# Patient Record
Sex: Female | Born: 1940 | Race: White | Hispanic: No | Marital: Married | Smoking: Never smoker
Health system: Southern US, Community
[De-identification: ages and names within clinical notes are randomized; demographics above are authoritative.]

## PROBLEM LIST (undated history)

## (undated) DIAGNOSIS — G43909 Migraine, unspecified, not intractable, without status migrainosus: Secondary | ICD-10-CM

## (undated) DIAGNOSIS — M8580 Other specified disorders of bone density and structure, unspecified site: Principal | ICD-10-CM

## (undated) DIAGNOSIS — H269 Unspecified cataract: Secondary | ICD-10-CM

## (undated) DIAGNOSIS — M1990 Unspecified osteoarthritis, unspecified site: Secondary | ICD-10-CM

## (undated) DIAGNOSIS — K219 Gastro-esophageal reflux disease without esophagitis: Secondary | ICD-10-CM

## (undated) DIAGNOSIS — R011 Cardiac murmur, unspecified: Secondary | ICD-10-CM

## (undated) DIAGNOSIS — M949 Disorder of cartilage, unspecified: Secondary | ICD-10-CM

## (undated) DIAGNOSIS — M899 Disorder of bone, unspecified: Secondary | ICD-10-CM

## (undated) DIAGNOSIS — M810 Age-related osteoporosis without current pathological fracture: Secondary | ICD-10-CM

## (undated) DIAGNOSIS — F329 Major depressive disorder, single episode, unspecified: Secondary | ICD-10-CM

## (undated) DIAGNOSIS — IMO0001 Reserved for inherently not codable concepts without codable children: Secondary | ICD-10-CM

## (undated) DIAGNOSIS — F419 Anxiety disorder, unspecified: Secondary | ICD-10-CM

## (undated) DIAGNOSIS — E785 Hyperlipidemia, unspecified: Secondary | ICD-10-CM

## (undated) DIAGNOSIS — IMO0002 Reserved for concepts with insufficient information to code with codable children: Secondary | ICD-10-CM

## (undated) DIAGNOSIS — I219 Acute myocardial infarction, unspecified: Secondary | ICD-10-CM

## (undated) DIAGNOSIS — E039 Hypothyroidism, unspecified: Secondary | ICD-10-CM

## (undated) DIAGNOSIS — M797 Fibromyalgia: Secondary | ICD-10-CM

## (undated) DIAGNOSIS — D649 Anemia, unspecified: Secondary | ICD-10-CM

## (undated) DIAGNOSIS — D869 Sarcoidosis, unspecified: Secondary | ICD-10-CM

## (undated) DIAGNOSIS — D510 Vitamin B12 deficiency anemia due to intrinsic factor deficiency: Secondary | ICD-10-CM

## (undated) HISTORY — DX: Gastro-esophageal reflux disease without esophagitis: K21.9

## (undated) HISTORY — DX: Vitamin B12 deficiency anemia due to intrinsic factor deficiency: D51.0

## (undated) HISTORY — PX: COLONOSCOPY: SHX174

## (undated) HISTORY — DX: Migraine, unspecified, not intractable, without status migrainosus: G43.909

## (undated) HISTORY — DX: Reserved for inherently not codable concepts without codable children: IMO0001

## (undated) HISTORY — DX: Hypothyroidism, unspecified: E03.9

## (undated) HISTORY — DX: Major depressive disorder, single episode, unspecified: F32.9

## (undated) HISTORY — DX: Age-related osteoporosis without current pathological fracture: M81.0

## (undated) HISTORY — PX: FRACTURE SURGERY: SHX138

## (undated) HISTORY — DX: Unspecified osteoarthritis, unspecified site: M19.90

## (undated) HISTORY — DX: Acute myocardial infarction, unspecified: I21.9

## (undated) HISTORY — DX: Hyperlipidemia, unspecified: E78.5

## (undated) HISTORY — DX: Unspecified cataract: H26.9

## (undated) HISTORY — DX: Disorder of cartilage, unspecified: M94.9

## (undated) HISTORY — DX: Anemia, unspecified: D64.9

## (undated) HISTORY — PX: UPPER GASTROINTESTINAL ENDOSCOPY: SHX188

## (undated) HISTORY — DX: Cardiac murmur, unspecified: R01.1

## (undated) HISTORY — PX: CATARACT EXTRACTION: SUR2

## (undated) HISTORY — DX: Disorder of bone, unspecified: M89.9

## (undated) HISTORY — PX: EYE SURGERY: SHX253

## (undated) HISTORY — DX: Reserved for concepts with insufficient information to code with codable children: IMO0002

## (undated) HISTORY — DX: Sarcoidosis, unspecified: D86.9

---

## 1966-01-14 HISTORY — PX: LYMPH NODE BIOPSY: SHX201

## 2005-02-08 ENCOUNTER — Encounter: Payer: Self-pay | Admitting: Internal Medicine

## 2006-10-09 ENCOUNTER — Ambulatory Visit: Payer: Self-pay | Admitting: Internal Medicine

## 2006-10-09 LAB — CONVERTED CEMR LAB
Alkaline Phosphatase: 54 units/L (ref 39–117)
BUN: 16 mg/dL (ref 6–23)
Basophils Relative: 1 % (ref 0.0–1.0)
Bilirubin Urine: NEGATIVE
CO2: 29 meq/L (ref 19–32)
Cholesterol: 227 mg/dL (ref 0–200)
Creatinine, Ser: 0.7 mg/dL (ref 0.4–1.2)
Crystals: NEGATIVE
Eosinophils Relative: 2.6 % (ref 0.0–5.0)
Hemoglobin, Urine: NEGATIVE
Hemoglobin: 13.1 g/dL (ref 12.0–15.0)
Lymphocytes Relative: 19.8 % (ref 12.0–46.0)
Monocytes Absolute: 0.7 10*3/uL (ref 0.2–0.7)
Mucus, UA: NEGATIVE
Neutro Abs: 4.6 10*3/uL (ref 1.4–7.7)
Nitrite: NEGATIVE
Potassium: 4.6 meq/L (ref 3.5–5.1)
Sodium: 140 meq/L (ref 135–145)
TSH: 1.4 microintl units/mL (ref 0.35–5.50)
Total Bilirubin: 1.1 mg/dL (ref 0.3–1.2)
Total Protein, Urine: NEGATIVE mg/dL
Total Protein: 6.7 g/dL (ref 6.0–8.3)
Urine Glucose: NEGATIVE mg/dL
Urobilinogen, UA: 0.2 (ref 0.0–1.0)
VLDL: 43 mg/dL — ABNORMAL HIGH (ref 0–40)
WBC: 7 10*3/uL (ref 4.5–10.5)

## 2006-10-13 ENCOUNTER — Ambulatory Visit: Payer: Self-pay | Admitting: Internal Medicine

## 2006-10-17 ENCOUNTER — Encounter: Payer: Self-pay | Admitting: Internal Medicine

## 2006-10-17 DIAGNOSIS — K219 Gastro-esophageal reflux disease without esophagitis: Secondary | ICD-10-CM

## 2006-10-17 DIAGNOSIS — M899 Disorder of bone, unspecified: Secondary | ICD-10-CM | POA: Insufficient documentation

## 2006-10-17 DIAGNOSIS — E039 Hypothyroidism, unspecified: Secondary | ICD-10-CM

## 2006-10-17 DIAGNOSIS — M949 Disorder of cartilage, unspecified: Secondary | ICD-10-CM

## 2006-10-17 DIAGNOSIS — D869 Sarcoidosis, unspecified: Secondary | ICD-10-CM

## 2006-10-17 DIAGNOSIS — G43909 Migraine, unspecified, not intractable, without status migrainosus: Secondary | ICD-10-CM | POA: Insufficient documentation

## 2006-10-17 DIAGNOSIS — D509 Iron deficiency anemia, unspecified: Secondary | ICD-10-CM

## 2006-10-17 DIAGNOSIS — E785 Hyperlipidemia, unspecified: Secondary | ICD-10-CM

## 2006-10-17 DIAGNOSIS — M1990 Unspecified osteoarthritis, unspecified site: Secondary | ICD-10-CM

## 2006-10-17 DIAGNOSIS — D649 Anemia, unspecified: Secondary | ICD-10-CM

## 2006-10-17 DIAGNOSIS — F329 Major depressive disorder, single episode, unspecified: Secondary | ICD-10-CM

## 2006-10-17 DIAGNOSIS — F32A Depression, unspecified: Secondary | ICD-10-CM | POA: Insufficient documentation

## 2006-10-17 DIAGNOSIS — F3289 Other specified depressive episodes: Secondary | ICD-10-CM

## 2006-10-17 HISTORY — DX: Disorder of bone, unspecified: M89.9

## 2006-10-17 HISTORY — DX: Migraine, unspecified, not intractable, without status migrainosus: G43.909

## 2006-10-17 HISTORY — DX: Major depressive disorder, single episode, unspecified: F32.9

## 2006-10-17 HISTORY — DX: Anemia, unspecified: D64.9

## 2006-10-17 HISTORY — DX: Hyperlipidemia, unspecified: E78.5

## 2006-10-17 HISTORY — DX: Gastro-esophageal reflux disease without esophagitis: K21.9

## 2006-10-17 HISTORY — DX: Unspecified osteoarthritis, unspecified site: M19.90

## 2006-10-17 HISTORY — DX: Hypothyroidism, unspecified: E03.9

## 2006-10-17 HISTORY — DX: Other specified depressive episodes: F32.89

## 2006-10-17 HISTORY — DX: Sarcoidosis, unspecified: D86.9

## 2006-10-24 ENCOUNTER — Encounter: Admission: RE | Admit: 2006-10-24 | Discharge: 2006-10-24 | Payer: Self-pay | Admitting: Internal Medicine

## 2007-01-20 ENCOUNTER — Encounter: Payer: Self-pay | Admitting: Internal Medicine

## 2007-01-20 ENCOUNTER — Ambulatory Visit: Payer: Self-pay | Admitting: Internal Medicine

## 2007-02-09 ENCOUNTER — Ambulatory Visit: Payer: Self-pay | Admitting: Internal Medicine

## 2007-02-09 DIAGNOSIS — IMO0001 Reserved for inherently not codable concepts without codable children: Secondary | ICD-10-CM

## 2007-02-09 DIAGNOSIS — M810 Age-related osteoporosis without current pathological fracture: Secondary | ICD-10-CM

## 2007-02-09 HISTORY — DX: Age-related osteoporosis without current pathological fracture: M81.0

## 2007-02-09 HISTORY — DX: Reserved for inherently not codable concepts without codable children: IMO0001

## 2007-04-07 ENCOUNTER — Telehealth: Payer: Self-pay | Admitting: Internal Medicine

## 2007-05-26 ENCOUNTER — Telehealth (INDEPENDENT_AMBULATORY_CARE_PROVIDER_SITE_OTHER): Payer: Self-pay | Admitting: *Deleted

## 2007-05-26 ENCOUNTER — Encounter: Payer: Self-pay | Admitting: Internal Medicine

## 2007-07-10 ENCOUNTER — Ambulatory Visit: Payer: Self-pay | Admitting: Internal Medicine

## 2007-09-15 ENCOUNTER — Ambulatory Visit: Payer: Self-pay | Admitting: Internal Medicine

## 2007-09-15 LAB — CONVERTED CEMR LAB
ALT: 15 units/L (ref 0–35)
AST: 19 units/L (ref 0–37)
Albumin: 3.6 g/dL (ref 3.5–5.2)
Alkaline Phosphatase: 44 units/L (ref 39–117)
BUN: 15 mg/dL (ref 6–23)
Basophils Relative: 0.5 % (ref 0.0–3.0)
CO2: 30 meq/L (ref 19–32)
Chloride: 110 meq/L (ref 96–112)
Creatinine, Ser: 0.7 mg/dL (ref 0.4–1.2)
Direct LDL: 120.6 mg/dL
Eosinophils Relative: 3.5 % (ref 0.0–5.0)
Lymphocytes Relative: 24.4 % (ref 12.0–46.0)
Monocytes Relative: 10 % (ref 3.0–12.0)
Mucus, UA: NEGATIVE
Neutrophils Relative %: 61.6 % (ref 43.0–77.0)
Nitrite: NEGATIVE
Potassium: 4.2 meq/L (ref 3.5–5.1)
RBC: 4.12 M/uL (ref 3.87–5.11)
Specific Gravity, Urine: 1.02 (ref 1.000–1.03)
Total Bilirubin: 0.9 mg/dL (ref 0.3–1.2)
Total CHOL/HDL Ratio: 6.6
Urine Glucose: NEGATIVE mg/dL
Urobilinogen, UA: 0.2 (ref 0.0–1.0)
VLDL: 26 mg/dL (ref 0–40)
WBC: 5.8 10*3/uL (ref 4.5–10.5)

## 2007-09-22 ENCOUNTER — Ambulatory Visit: Payer: Self-pay | Admitting: Internal Medicine

## 2007-10-01 ENCOUNTER — Ambulatory Visit: Payer: Self-pay | Admitting: Internal Medicine

## 2007-10-27 ENCOUNTER — Ambulatory Visit (HOSPITAL_BASED_OUTPATIENT_CLINIC_OR_DEPARTMENT_OTHER): Admission: RE | Admit: 2007-10-27 | Discharge: 2007-10-27 | Payer: Self-pay | Admitting: Internal Medicine

## 2007-12-24 ENCOUNTER — Ambulatory Visit: Payer: Self-pay | Admitting: Internal Medicine

## 2008-06-02 ENCOUNTER — Emergency Department (HOSPITAL_COMMUNITY): Admission: EM | Admit: 2008-06-02 | Discharge: 2008-06-03 | Payer: Self-pay | Admitting: Emergency Medicine

## 2008-06-03 ENCOUNTER — Telehealth: Payer: Self-pay | Admitting: Internal Medicine

## 2008-09-16 ENCOUNTER — Ambulatory Visit: Payer: Self-pay | Admitting: Internal Medicine

## 2008-09-16 LAB — CONVERTED CEMR LAB
Albumin: 3.7 g/dL (ref 3.5–5.2)
BUN: 14 mg/dL (ref 6–23)
Basophils Relative: 1.7 % (ref 0.0–3.0)
Calcium: 9.6 mg/dL (ref 8.4–10.5)
Cholesterol: 215 mg/dL — ABNORMAL HIGH (ref 0–200)
Creatinine, Ser: 0.8 mg/dL (ref 0.4–1.2)
Direct LDL: 138.4 mg/dL
Eosinophils Absolute: 0.1 10*3/uL (ref 0.0–0.7)
Eosinophils Relative: 2.3 % (ref 0.0–5.0)
GFR calc non Af Amer: 76.07 mL/min (ref >60)
Glucose, Bld: 84 mg/dL (ref 70–99)
HCT: 38.8 % (ref 36.0–46.0)
HDL: 50.7 mg/dL (ref >39.00)
Hemoglobin, Urine: NEGATIVE
Ketones, ur: NEGATIVE mg/dL
Lymphs Abs: 1.2 10*3/uL (ref 0.7–4.0)
MCHC: 33.9 g/dL (ref 30.0–36.0)
MCV: 92.2 fL (ref 78.0–100.0)
Monocytes Absolute: 0.5 10*3/uL (ref 0.1–1.0)
Neutrophils Relative %: 65.3 % (ref 43.0–77.0)
Potassium: 4.5 meq/L (ref 3.5–5.1)
RBC: 4.21 M/uL (ref 3.87–5.11)
Total Bilirubin: 1 mg/dL (ref 0.3–1.2)
Total CHOL/HDL Ratio: 4
Total Protein, Urine: NEGATIVE mg/dL
Triglycerides: 79 mg/dL (ref 0.0–149.0)
Urine Glucose: NEGATIVE mg/dL
Urobilinogen, UA: 1 (ref 0.0–1.0)
VLDL: 15.8 mg/dL (ref 0.0–40.0)

## 2008-09-22 ENCOUNTER — Ambulatory Visit: Payer: Self-pay | Admitting: Internal Medicine

## 2008-10-05 ENCOUNTER — Telehealth: Payer: Self-pay | Admitting: Internal Medicine

## 2008-10-28 ENCOUNTER — Ambulatory Visit (HOSPITAL_BASED_OUTPATIENT_CLINIC_OR_DEPARTMENT_OTHER): Admission: RE | Admit: 2008-10-28 | Discharge: 2008-10-28 | Payer: Self-pay | Admitting: Internal Medicine

## 2008-10-28 ENCOUNTER — Ambulatory Visit: Payer: Self-pay | Admitting: Diagnostic Radiology

## 2009-01-17 ENCOUNTER — Telehealth: Payer: Self-pay | Admitting: Internal Medicine

## 2009-01-30 ENCOUNTER — Ambulatory Visit: Payer: Self-pay | Admitting: Family Medicine

## 2009-01-30 ENCOUNTER — Encounter: Payer: Self-pay | Admitting: Internal Medicine

## 2009-09-29 ENCOUNTER — Ambulatory Visit: Payer: Self-pay | Admitting: Internal Medicine

## 2009-09-29 LAB — CONVERTED CEMR LAB
Albumin: 3.8 g/dL (ref 3.5–5.2)
Basophils Absolute: 0.1 10*3/uL (ref 0.0–0.1)
Bilirubin Urine: NEGATIVE
CO2: 30 meq/L (ref 19–32)
Calcium: 9.4 mg/dL (ref 8.4–10.5)
Creatinine, Ser: 0.8 mg/dL (ref 0.4–1.2)
Eosinophils Relative: 3.6 % (ref 0.0–5.0)
GFR calc non Af Amer: 81.7 mL/min (ref >60)
Glucose, Bld: 73 mg/dL (ref 70–99)
HCT: 39.1 % (ref 36.0–46.0)
HDL: 50.4 mg/dL (ref >39.00)
Hemoglobin, Urine: NEGATIVE
Hemoglobin: 13.3 g/dL (ref 12.0–15.0)
Ketones, ur: NEGATIVE mg/dL
Lymphs Abs: 1.2 10*3/uL (ref 0.7–4.0)
MCV: 91 fL (ref 78.0–100.0)
Monocytes Absolute: 0.5 10*3/uL (ref 0.1–1.0)
Monocytes Relative: 8.7 % (ref 3.0–12.0)
Neutro Abs: 3.3 10*3/uL (ref 1.4–7.7)
Nitrite: NEGATIVE
Platelets: 322 10*3/uL (ref 150.0–400.0)
RDW: 13.7 % (ref 11.5–14.6)
Sodium: 141 meq/L (ref 135–145)
Total Protein, Urine: NEGATIVE mg/dL
Total Protein: 6.3 g/dL (ref 6.0–8.3)
Triglycerides: 177 mg/dL — ABNORMAL HIGH (ref 0.0–149.0)
Urine Glucose: NEGATIVE mg/dL
VLDL: 35.4 mg/dL (ref 0.0–40.0)
pH: 7.5 (ref 5.0–8.0)

## 2009-10-06 ENCOUNTER — Ambulatory Visit: Payer: Self-pay | Admitting: Internal Medicine

## 2009-10-06 ENCOUNTER — Encounter: Payer: Self-pay | Admitting: Internal Medicine

## 2009-10-06 DIAGNOSIS — IMO0002 Reserved for concepts with insufficient information to code with codable children: Secondary | ICD-10-CM

## 2009-10-06 HISTORY — DX: Reserved for concepts with insufficient information to code with codable children: IMO0002

## 2009-10-10 ENCOUNTER — Telehealth: Payer: Self-pay | Admitting: Internal Medicine

## 2009-10-12 ENCOUNTER — Encounter: Payer: Self-pay | Admitting: Internal Medicine

## 2009-10-20 ENCOUNTER — Ambulatory Visit: Payer: Self-pay | Admitting: Internal Medicine

## 2009-11-02 ENCOUNTER — Ambulatory Visit: Payer: Self-pay | Admitting: Diagnostic Radiology

## 2009-11-02 ENCOUNTER — Ambulatory Visit (HOSPITAL_BASED_OUTPATIENT_CLINIC_OR_DEPARTMENT_OTHER): Admission: RE | Admit: 2009-11-02 | Discharge: 2009-11-02 | Payer: Self-pay | Admitting: Internal Medicine

## 2009-11-02 LAB — HM MAMMOGRAPHY: HM Mammogram: NEGATIVE

## 2009-11-06 ENCOUNTER — Encounter: Payer: Self-pay | Admitting: Internal Medicine

## 2009-11-09 ENCOUNTER — Ambulatory Visit: Payer: Self-pay | Admitting: Internal Medicine

## 2009-11-09 LAB — CONVERTED CEMR LAB
Direct LDL: 118.4 mg/dL
HDL: 49.7 mg/dL (ref >39.00)
Total CHOL/HDL Ratio: 4
Triglycerides: 168 mg/dL — ABNORMAL HIGH (ref 0.0–149.0)
VLDL: 33.6 mg/dL (ref 0.0–40.0)

## 2009-12-11 ENCOUNTER — Encounter: Admission: RE | Admit: 2009-12-11 | Discharge: 2009-12-11 | Payer: Self-pay | Admitting: Neurological Surgery

## 2010-02-14 NOTE — Assessment & Plan Note (Signed)
Summary: CPX / NWS  #   Vital Signs:  Patient profile:   70 year old female Height:      66 inches Weight:      147.13 pounds BMI:     23.83 O2 Sat:      94 % on Room air Temp:     98.3 degrees F oral Pulse rate:   68 / minute BP sitting:   134 / 82  (left arm) Cuff size:   regular  Vitals Entered By: Zella Ball Ewing CMA Duncan Dull) (October 06, 2009 11:09 AM)  O2 Flow:  Room air  Preventive Care Screening  Mammogram:    Date:  10/13/2008    Next Due:  10/2009    Results:  normal   Bone Density:    Date:  01/30/2009    Next Due:  02/2011    Results:  abnormal std dev  Last Flu Shot:    Date:  10/06/2009    Results:  Fluvax 3+     has next mammogram scheduled for oct 2011  CC: Adult Physical/RE   CC:  Adult Physical/RE.  History of Present Illness: here for wellness and f/u - overall doing ok, but today c/o new onset since june 2011 of Left LBP, mild to mod, worse in the past 2 wks, assoc with LLE numbness and tingling, decreased sensation below the knee;  no bowel or bladder change, no gait change, fall or injury, and No fever, wt loss, night sweats, loss of appetite or other constitutional symptoms ,  Has known lumbar DJD, but no prior significant disc disease, or prior MRI, surgury.    mentions also she has GI upset even with the atelvia, and can only take once per month, instead of once per wk as prescribed.    Trying to follow lower chol diet, has been statin intolerant in the past, most recently had to stop the pravachol as well due to myalgias.  Pt denies CP, worsening sob, doe, wheezing, orthopnea, pnd, worsening LE edema, palps, dizziness or syncope  Pt denies new neuro symptoms such as headache, facial or extremity weakness   Pt denies polydipsia, polyuria,  Overall good compliance with meds, trying to follow low chol, DM diet, wt stable, little excercise however .    due for flu shot today  Preventive Screening-Counseling & Management      Drug Use:  no.     Problems Prior to Update: 1)  Lumbar Radiculopathy, Left  (ICD-724.4) 2)  Preventive Health Care  (ICD-V70.0) 3)  Preventive Health Care  (ICD-V70.0) 4)  Family History of Alcoholism/addiction  (ICD-V61.41) 5)  Osteoporosis  (ICD-733.00) 6)  Fibromyalgia  (ICD-729.1) 7)  Migraine Headache  (ICD-346.90) 8)  Sarcoidosis  (ICD-135) 9)  Osteopenia  (ICD-733.90) 10)  Osteoarthritis  (ICD-715.90) 11)  Hypothyroidism  (ICD-244.9) 12)  Hyperlipidemia  (ICD-272.4) 13)  Gerd  (ICD-530.81) 14)  Depression  (ICD-311) 15)  Anemia-nos  (ICD-285.9)  Medications Prior to Update: 1)  Ibuprofen 200 Mg  Caps (Ibuprofen) .Marland Kitchen.. 1po Qd 2)  Levothyroxine Sodium 100 Mcg  Tabs (Levothyroxine Sodium) .Marland Kitchen.. 1 By Mouth Once Daily 3)  Fluoxetine Hcl 20 Mg  Caps (Fluoxetine Hcl) .Marland Kitchen.. 1 By Mouth Once Daily 4)  Vagifem 25 Mcg Tabs (Estradiol) .... By Mouth Qd 5)  Cyanocobalamin 1000 Mcg/ml Soln (Cyanocobalamin) .... Use Asd 1 Cc Im Q Month 6)  Pravastatin Sodium 20 Mg Tabs (Pravastatin Sodium) .Marland Kitchen.. 1po Once Daily 7)  Syringe/needle For B12 Shots .... Use Asd 1  Q Month 8)  Atelvia 35 Mg .Marland Kitchen.. 1 By Mouth Q Wk  Current Medications (verified): 1)  Ibuprofen 200 Mg  Caps (Ibuprofen) .Marland Kitchen.. 1po Qd 2)  Levothyroxine Sodium 100 Mcg  Tabs (Levothyroxine Sodium) .Marland Kitchen.. 1 By Mouth Once Daily 3)  Fluoxetine Hcl 20 Mg  Caps (Fluoxetine Hcl) .Marland Kitchen.. 1 By Mouth Once Daily 4)  Vagifem 25 Mcg Tabs (Estradiol) .... By Mouth Qd 5)  Cyanocobalamin 1000 Mcg/ml Soln (Cyanocobalamin) .... Use Asd 1 Cc Im Q Month 6)  Syringe/needle For B12 Shots .... Use Asd 1 Q Month 7)  Welchol 3.75 Gm Pack (Colesevelam Hcl) .Marland Kitchen.. 1 Pkt By Mouth Once Daily in 8 Oz Water  Allergies (verified): 1)  ! * Dimetapp 2)  ! Sudafed 3)  Pravachol 4)  * Atelvia  Past History:  Past Medical History: Last updated: 02/09/2007 Anemia-NOS Depression GERD Hyperlipidemia Hypothyroidism Osteoarthritis Osteopenia Migraine Headaches Sarcoidosis - 1967 DJD  c-spine and lumbar Fibromyalgia chronic constipation Osteoporosis  Past Surgical History: Last updated: 10/17/2006 Lymph node Biopsy Cataract extraction  Family History: Last updated: 02/09/2007 Family History of Alcoholism/Addiction Family History of Anxiety heart disease HTN  Social History: Last updated: 10/06/2009 Never Smoked Alcohol use-no Married 2 daughters work - psychology Drug use-no  Risk Factors: Smoking Status: never (02/09/2007)  Social History: Reviewed history from 02/09/2007 and no changes required. Never Smoked Alcohol use-no Married 2 daughters work - psychology Drug use-no Drug Use:  no  Review of Systems  The patient denies anorexia, fever, vision loss, decreased hearing, hoarseness, chest pain, syncope, dyspnea on exertion, peripheral edema, prolonged cough, headaches, hemoptysis, abdominal pain, melena, hematochezia, severe indigestion/heartburn, hematuria, suspicious skin lesions, transient blindness, depression, unusual weight change, abnormal bleeding, enlarged lymph nodes, and angioedema.         all otherwise negative per pt -    Physical Exam  General:  alert and well-developed.   Head:  normocephalic and atraumatic.   Eyes:  vision grossly intact, pupils equal, and pupils round.   Ears:  R ear normal and L ear normal.   Nose:  no external deformity and no nasal discharge.   Mouth:  no gingival abnormalities and pharynx pink and moist.   Neck:  supple and no masses.   Lungs:  normal respiratory effort and normal breath sounds.   Heart:  normal rate, regular rhythm, and no murmur.   Abdomen:  soft, non-tender, and normal bowel sounds.   Msk:  no joint tenderness and no joint swelling.   Extremities:  no edema, no erythema  Neurologic:  cranial nerves II-XII intact and strength normal in all extremities.  sensation intact to light touch, DTRs symmetrical and normal, and finger-to-nose normal. except for  4+/5 diffuse LLE weakness  (more proximal than distal) and decreased sensation to touch LLE (more prox than distal) Skin:  color normal and no rashes.   Psych:  not depressed appearing and slightly anxious.     Impression & Recommendations:  Problem # 1:  Preventive Health Care (ICD-V70.0) Overall doing well, age appropriate education and counseling updated and referral for appropriate preventive services done unless declined, immunizations up to date or declined, diet counseling done if overweight, urged to quit smoking if smokes , most recent labs reviewed and current ordered if appropriate, ecg reviewed or declined (interpretation per ECG scanned in the EMR if done); information regarding Medicare Prevention requirements given if appropriate; speciality referrals updated as appropriate  Orders: EKG w/ Interpretation (93000)  Problem # 2:  LUMBAR RADICULOPATHY,  LEFT (ICD-724.4)  Her updated medication list for this problem includes:    Ibuprofen 200 Mg Caps (Ibuprofen) .Marland Kitchen... 1po qd  Orders: Neurosurgeon Referral Psychologist, educational) Radiology Referral (Radiology)  exam c/w new onset LBP with motor/sensory defecit to LLE , mild overall;  will need MRI LS spine, and NS referral  Problem # 3:  OSTEOPOROSIS (ICD-733.00) will try to arrange for change of atelvia to prolia depending on affordability of copay for pt  Problem # 4:  HYPERLIPIDEMIA (ICD-272.4)  The following medications were removed from the medication list:    Pravastatin Sodium 20 Mg Tabs (Pravastatin sodium) .Marland Kitchen... 1po once daily Her updated medication list for this problem includes:    Welchol 3.75 Gm Pack (Colesevelam hcl) .Marland Kitchen... 1 pkt by mouth once daily in 8 oz water statin intolerant - ok to take welchol as above, Pt to continue diet efforts  Labs Reviewed: SGOT: 22 (09/29/2009)   SGPT: 16 (09/29/2009)   HDL:50.40 (09/29/2009), 50.70 (09/16/2008)  LDL:DEL (09/15/2007), DEL (10/09/2006)  Chol:251 (09/29/2009), 215 (09/16/2008)  Trig:177.0  (09/29/2009), 79.0 (09/16/2008)  Complete Medication List: 1)  Ibuprofen 200 Mg Caps (Ibuprofen) .Marland Kitchen.. 1po qd 2)  Levothyroxine Sodium 100 Mcg Tabs (Levothyroxine sodium) .Marland Kitchen.. 1 by mouth once daily 3)  Fluoxetine Hcl 20 Mg Caps (Fluoxetine hcl) .Marland Kitchen.. 1 by mouth once daily 4)  Vagifem 25 Mcg Tabs (Estradiol) .... By mouth qd 5)  Cyanocobalamin 1000 Mcg/ml Soln (Cyanocobalamin) .... Use asd 1 cc im q month 6)  Syringe/needle For B12 Shots  .... Use asd 1 q month 7)  Welchol 3.75 Gm Pack (Colesevelam hcl) .Marland Kitchen.. 1 pkt by mouth once daily in 8 oz water  Other Orders: Admin 1st Vaccine ([address]) Flu Vaccine 35yrs + (60454)  Patient Instructions: 1)  you had the flu shot today 2)  stop the pravachol and atelvia 3)  start the welchol for the cholesterol 4)  we will call about the cost of the copay for the prolia 5)  You will be contacted about the referral(s) to: MRI for the LS Spine, and neurosurgury appt 6)  Continue all previous medications as before this visit  7)  Please return in 4 wks for LAB only: 8)  Lipid Panel prior to visit, ICD-9: 272.0 9)  Please call the number on the [facility] Card for results of your testing  10)  Please schedule a follow-up appointment in 1 year. or sooner if needed Prescriptions: WELCHOL 3.75 GM PACK (COLESEVELAM HCL) 1 pkt by mouth once daily in 8 oz water  #90 x 3   Entered and Authorized by:   Corwin Levins MD   Signed by:   Corwin Levins MD on 10/06/2009   Method used:   Print then Give to Patient   RxID:   [id number]     Flu Vaccine Consent Questions     Do you have a history of severe allergic reactions to this vaccine? no    Any prior history of allergic reactions to egg and/or gelatin? no    Do you have a sensitivity to the preservative Thimersol? no    Do you have a past history of Guillan-Barre Syndrome? no    Do you currently have an acute febrile illness? no    Have you ever had a severe reaction to latex? no    Vaccine information given and  explained to patient? yes    Are you currently pregnant? no    Lot Number:[id number]   Exp Date:07/14/2010   Site Given  Left Deltoid IMlu

## 2010-02-14 NOTE — Miscellaneous (Signed)
Summary: BONE DENSITY  Clinical Lists Changes  Orders: Added new Test order of T-Bone Densitometry (77080) - Signed Added new Test order of T-Lumbar Vertebral Assessment (77082) - Signed 

## 2010-02-14 NOTE — Progress Notes (Signed)
Summary: DXA Scan  Phone Note Call from Patient Call back at Work Phone [phone]   Caller: Patient Summary of Call: pt called requesting orders to have bone density scan done. Pt says it has been 2 yrs since she had it done last and her Insurance co will now pay for it. Initial call taken by: Margaret Pyle, CMA,  January 17, 2009 10:54 AM  Follow-up for Phone Call        order done - done hardcopy to LIM side B - dahlia  Follow-up by: Corwin Levins MD,  January 17, 2009 1:09 PM  Additional Follow-up for Phone Call Additional follow up Details #1::        pt called and transferred to [facility] to schedule Additional Follow-up by: Margaret Pyle, CMA,  January 17, 2009 1:55 PM

## 2010-02-14 NOTE — Letter (Signed)
Summary: [facility]   [facility]   Imported By: Lennie Odor 11/16/2009 13:54:47  _____________________________________________________________________  External Attachment:    Type:   Image     Comment:   External Document

## 2010-02-14 NOTE — Medication Information (Signed)
Summary: Benefits/Prolia  Benefits/Prolia   Imported By: Lester Buckley 10/16/2009 07:47:22  _____________________________________________________________________  External Attachment:    Type:   Image     Comment:   External Document

## 2010-02-14 NOTE — Assessment & Plan Note (Signed)
Summary: PER STACEY PROLIA-JWJ-CO PAY $25--STC  Nurse Visit   Allergies: 1)  ! * Dimetapp 2)  ! Sudafed 3)  Pravachol 4)  * Atelvia  Medication Administration  Injection # 1:    Medication: Prolia 60mg     Diagnosis: OSTEOPOROSIS (ICD-733.00)    Route: SQ    Site: R deltoid    Exp Date: 12/2010    Lot #: [id number]    Mfr: [employer]    Patient tolerated injection without complications    Given by: Brenton Grills MA (October 20, 2009 3:58 PM)  Orders Added: 1)  Admin of Therapeutic Inj  intramuscular or subcutaneous [96372] 2)  Prolia 60mg  [J3590]

## 2010-02-14 NOTE — Progress Notes (Signed)
Summary: Prolia  Phone Note Outgoing Call   Call placed by: Lanier Prude, [facility]),  October 10, 2009 8:19 AM Summary of Call: faxed Prolia form to verify benefits  Follow-up for Phone Call        left mess to call back to inform:she has $25.00 co-pay for Prolia inj. And to see if she would like to sched appt to receive Prolia inj. Follow-up by: Lanier Prude, [facility]),  October 12, 2009 3:16 PM  Additional Follow-up for Phone Call Additional follow up Details #1::        pt called back and I informed her of above.  She is coming next week. Additional Follow-up by: Lanier Prude, [facility]),  October 12, 2009 3:22 PM    Additional Follow-up for Phone Call Additional follow up Details #2::    noted Follow-up by: Corwin Levins MD,  October 12, 2009 5:07 PM

## 2010-04-24 LAB — CBC
MCV: 90.1 fL (ref 78.0–100.0)
Platelets: 338 10*3/uL (ref 150–400)
WBC: 5.3 10*3/uL (ref 4.0–10.5)

## 2010-04-24 LAB — DIFFERENTIAL
Basophils Relative: 1 % (ref 0–1)
Eosinophils Absolute: 0.2 10*3/uL (ref 0.0–0.7)
Monocytes Absolute: 0.6 10*3/uL (ref 0.1–1.0)
Monocytes Relative: 11 % (ref 3–12)
Neutrophils Relative %: 51 % (ref 43–77)

## 2010-04-24 LAB — BASIC METABOLIC PANEL
BUN: 14 mg/dL (ref 6–23)
Calcium: 9.5 mg/dL (ref 8.4–10.5)
Chloride: 103 mEq/L (ref 96–112)
Creatinine, Ser: 0.82 mg/dL (ref 0.4–1.2)

## 2010-04-24 LAB — CK TOTAL AND CKMB (NOT AT ARMC)
CK, MB: 2.9 ng/mL (ref 0.3–4.0)
Total CK: 142 U/L (ref 7–177)

## 2010-08-03 ENCOUNTER — Other Ambulatory Visit: Payer: Self-pay | Admitting: Internal Medicine

## 2010-08-20 ENCOUNTER — Other Ambulatory Visit: Payer: Self-pay

## 2010-08-20 MED ORDER — FLUOXETINE HCL 20 MG PO CAPS: 20.0000 mg | ORAL_CAPSULE | Freq: Every day | ORAL | Status: DC

## 2010-08-30 ENCOUNTER — Telehealth: Payer: Self-pay | Admitting: *Deleted

## 2010-08-30 NOTE — Telephone Encounter (Signed)
Called pt to see when she had her last Prolia inj. Left mess for patient to call back.

## 2010-09-03 NOTE — Telephone Encounter (Signed)
Left mess for patient to call back.  

## 2010-09-03 NOTE — Telephone Encounter (Signed)
Pt called stating she had her last Prolia inj in 10/2009. I informed her she is past due. She states she now has Medicare. I advised her to get me a updated copy of her ins card and I will reverify her benefits with Prolia. She states she will fax me copy of card.

## 2010-09-19 NOTE — Telephone Encounter (Signed)
I called pt again to see if she wants to proceed with Prolia. She states she will fax me her updated Medicare card so I can verify her benefits.

## 2010-09-19 NOTE — Telephone Encounter (Signed)
Faxed ins verification req to Prolia with copy of ins card. Will wait for summary of benefits.

## 2010-09-29 ENCOUNTER — Other Ambulatory Visit: Payer: Self-pay | Admitting: Internal Medicine

## 2010-09-29 DIAGNOSIS — Z0000 Encounter for general adult medical examination without abnormal findings: Secondary | ICD-10-CM

## 2010-10-01 NOTE — Telephone Encounter (Signed)
rec benefit summary stating pt's OOP is 20% approx $165. PA is required. I called [employer] today and form is being faxed to me.

## 2010-10-03 NOTE — Telephone Encounter (Signed)
Completed PA form faxed to [facility]. Rec fax back from [facility] stating Prolia is approved from 9/17/12to 10/01/2011.  Left detailed mess informing pt of this and to call back to let me know if she wants to have injection.

## 2010-10-04 NOTE — Telephone Encounter (Signed)
Pt left vm stating she does not want to have this "shot". Closing phone note.

## 2010-10-04 NOTE — Telephone Encounter (Signed)
Left detailed mess informing pt to call me if she wants to get Prolia inj.

## 2010-10-10 ENCOUNTER — Other Ambulatory Visit: Payer: Self-pay

## 2010-10-10 ENCOUNTER — Other Ambulatory Visit (INDEPENDENT_AMBULATORY_CARE_PROVIDER_SITE_OTHER): Payer: 59

## 2010-10-10 ENCOUNTER — Other Ambulatory Visit: Payer: Self-pay | Admitting: Internal Medicine

## 2010-10-10 DIAGNOSIS — Z79899 Other long term (current) drug therapy: Secondary | ICD-10-CM

## 2010-10-10 DIAGNOSIS — Z0000 Encounter for general adult medical examination without abnormal findings: Secondary | ICD-10-CM

## 2010-10-10 LAB — COMPREHENSIVE METABOLIC PANEL
AST: 20 U/L (ref 0–37)
BUN: 16 mg/dL (ref 6–23)
Calcium: 9.4 mg/dL (ref 8.4–10.5)
Chloride: 106 mEq/L (ref 96–112)
Creatinine, Ser: 0.8 mg/dL (ref 0.4–1.2)
GFR: 81.45 mL/min (ref >60.00)
Total Bilirubin: 0.8 mg/dL (ref 0.3–1.2)

## 2010-10-10 LAB — CBC WITH DIFFERENTIAL/PLATELET
Basophils Absolute: 0 10*3/uL (ref 0.0–0.1)
Basophils Relative: 0.5 % (ref 0.0–3.0)
Eosinophils Absolute: 0.2 10*3/uL (ref 0.0–0.7)
HCT: 40.6 % (ref 36.0–46.0)
Hemoglobin: 13.3 g/dL (ref 12.0–15.0)
Lymphocytes Relative: 20.5 % (ref 12.0–46.0)
Lymphs Abs: 1.1 10*3/uL (ref 0.7–4.0)
MCHC: 32.9 g/dL (ref 30.0–36.0)
MCV: 91.4 fl (ref 78.0–100.0)
Neutro Abs: 3.5 10*3/uL (ref 1.4–7.7)
RBC: 4.44 Mil/uL (ref 3.87–5.11)
RDW: 14 % (ref 11.5–14.6)

## 2010-10-10 LAB — URINALYSIS, ROUTINE W REFLEX MICROSCOPIC
Bilirubin Urine: NEGATIVE
Hgb urine dipstick: NEGATIVE
Total Protein, Urine: NEGATIVE
pH: 5.5 (ref 5.0–8.0)

## 2010-10-10 LAB — LDL CHOLESTEROL, DIRECT: Direct LDL: 154.7 mg/dL

## 2010-10-10 LAB — LIPID PANEL
HDL: 57.3 mg/dL (ref >39.00)
Triglycerides: 129 mg/dL (ref 0.0–149.0)
VLDL: 25.8 mg/dL (ref 0.0–40.0)

## 2010-10-10 LAB — TSH: TSH: 12.29 u[IU]/mL — ABNORMAL HIGH (ref 0.35–5.50)

## 2010-10-15 ENCOUNTER — Encounter: Payer: Self-pay | Admitting: Internal Medicine

## 2010-10-15 DIAGNOSIS — Z0000 Encounter for general adult medical examination without abnormal findings: Secondary | ICD-10-CM | POA: Insufficient documentation

## 2010-10-17 ENCOUNTER — Encounter: Payer: Self-pay | Admitting: Internal Medicine

## 2010-10-17 ENCOUNTER — Ambulatory Visit (INDEPENDENT_AMBULATORY_CARE_PROVIDER_SITE_OTHER): Payer: 59 | Admitting: Internal Medicine

## 2010-10-17 VITALS — BP 110/62 | HR 60 | Temp 98.1°F | Ht 66.0 in | Wt 146.1 lb

## 2010-10-17 DIAGNOSIS — Z0000 Encounter for general adult medical examination without abnormal findings: Secondary | ICD-10-CM

## 2010-10-17 DIAGNOSIS — Z23 Encounter for immunization: Secondary | ICD-10-CM

## 2010-10-17 MED ORDER — FLUOXETINE HCL 20 MG PO CAPS: 20.0000 mg | ORAL_CAPSULE | Freq: Every day | ORAL | Status: DC

## 2010-10-17 MED ORDER — LEVOTHYROXINE SODIUM 100 MCG PO TABS: 100.0000 ug | ORAL_TABLET | Freq: Every day | ORAL | Status: DC

## 2010-10-17 NOTE — Progress Notes (Signed)
Subjective:    Patient ID: Sheena Craig, female    DOB: 05/17/1940, 70 y.o.   MRN: [id number]  HPI  Here for wellness and f/u;  Overall doing ok;  Pt denies CP, worsening SOB, DOE, wheezing, orthopnea, PND, worsening LE edema, palpitations, dizziness or syncope.  Pt denies neurological change such as new Headache, facial or extremity weakness.  Pt denies polydipsia, polyuria, or low sugar symptoms. Pt states overall good compliance with treatment and medications, good tolerability, and trying to follow lower cholesterol diet.  Pt denies worsening depressive symptoms, suicidal ideation or panic. No fever, wt loss, night sweats, loss of appetite, or other constitutional symptoms.  Pt states good ability with ADL's, low fall risk, home safety reviewed and adequate, no significant changes in hearing or vision, and occasionally active with exercise. Admits to dietary indiscretion with ice cream too much.   Past Medical History  Diagnosis Date  . ANEMIA-NOS 10/17/2006  . DEPRESSION 10/17/2006  . FIBROMYALGIA 02/09/2007  . GERD 10/17/2006  . HYPERLIPIDEMIA 10/17/2006  . HYPOTHYROIDISM 10/17/2006  . LUMBAR RADICULOPATHY, LEFT 10/06/2009  . MIGRAINE HEADACHE 10/17/2006  . OSTEOARTHRITIS 10/17/2006  . OSTEOPENIA 10/17/2006  . OSTEOPOROSIS 02/09/2007  . Sarcoidosis 10/17/2006   Past Surgical History  Procedure Date  . Lymph node biopsy   . Cataract extraction     reports that she has never smoked. She does not have any smokeless tobacco history on file. She reports that she does not drink alcohol or use illicit drugs. family history includes Alcohol abuse in her other; Anxiety disorder in her other; Heart disease in her other; and Hypertension in her other. Allergies  Allergen Reactions  . Pravastatin Sodium     REACTION: GI upset  . Pseudoephedrine    Current Outpatient Prescriptions on File Prior to Visit  Medication Sig Dispense Refill  . cyanocobalamin (,VITAMIN B-12,) 1000 MCG/ML injection  Inject 1,000 mcg into the muscle every 30 (thirty) days.        Marland Kitchen FLUoxetine (PROZAC) 20 MG capsule Take 1 capsule (20 mg total) by mouth daily.  90 capsule  0  . levothyroxine (SYNTHROID, LEVOTHROID) 100 MCG tablet TAKE 1 TABLET BY MOUTH EVERY DAY  90 tablet  0  . Syringe/Needle, Disp, 25G X 1" 1 ML MISC Use as directed once every month for B12 injections.       Marland Kitchen estradiol (VAGIFEM) 25 MCG vaginal tablet Place 25 mcg vaginally daily.        Marland Kitchen ibuprofen (ADVIL,MOTRIN) 200 MG tablet Take 200 mg by mouth daily.         Review of Systems Review of Systems  Constitutional: Negative for diaphoresis, activity change, appetite change and unexpected weight change.  HENT: Negative for hearing loss, ear pain, facial swelling, mouth sores and neck stiffness.   Eyes: Negative for pain, redness and visual disturbance.  Respiratory: Negative for shortness of breath and wheezing.   Cardiovascular: Negative for chest pain and palpitations.  Gastrointestinal: Negative for diarrhea, blood in stool, abdominal distention and rectal pain.  Genitourinary: Negative for hematuria, flank pain and decreased urine volume.  Musculoskeletal: Negative for myalgias and joint swelling.  Skin: Negative for color change and wound.  Neurological: Negative for syncope and numbness.  Hematological: Negative for adenopathy.  Psychiatric/Behavioral: Negative for hallucinations, self-injury, decreased concentration and agitation.      Objective:   Physical Exam BP 110/62  Pulse 60  Temp(Src) 98.1 F (36.7 C) (Oral)  Ht 5\' 6"  (1.676 m)  Wt 146 lb  2 oz (66.282 kg)  BMI 23.59 kg/m2  SpO2 95% Physical Exam  VS noted Constitutional: Pt is oriented to person, place, and time. Appears well-developed and well-nourished.  HENT:  Head: Normocephalic and atraumatic.  Right Ear: External ear normal.  Left Ear: External ear normal.  Nose: Nose normal.  Mouth/Throat: Oropharynx is clear and moist.  Eyes: Conjunctivae and EOM  are normal. Pupils are equal, round, and reactive to light.  Neck: Normal range of motion. Neck supple. No JVD present. No tracheal deviation present.  Cardiovascular: Normal rate, regular rhythm, normal heart sounds and intact distal pulses.   Pulmonary/Chest: Effort normal and breath sounds normal.  Abdominal: Soft. Bowel sounds are normal. There is no tenderness.  Musculoskeletal: Normal range of motion. Exhibits no edema.  Lymphadenopathy:  Has no cervical adenopathy.  Neurological: Pt is alert and oriented to person, place, and time. Pt has normal reflexes. No cranial nerve deficit.  Skin: Skin is warm and dry. No rash noted.  Psychiatric:  Has  normal mood and affect. Behavior is normal.     Assessment & Plan:

## 2010-10-17 NOTE — Assessment & Plan Note (Signed)

## 2010-10-17 NOTE — Patient Instructions (Signed)
Please follow lower cholesterol diet Continue all other medications as before Your medications were sent to [facility], as well as also given hardcopy You are up to date with prevention at this time Please return in 1 year for your yearly visit, or sooner if needed, with Lab testing done 3-5 days before

## 2010-11-13 ENCOUNTER — Other Ambulatory Visit: Payer: Self-pay | Admitting: Internal Medicine

## 2010-11-13 DIAGNOSIS — Z1231 Encounter for screening mammogram for malignant neoplasm of breast: Secondary | ICD-10-CM

## 2010-11-16 ENCOUNTER — Ambulatory Visit (HOSPITAL_BASED_OUTPATIENT_CLINIC_OR_DEPARTMENT_OTHER)
Admission: RE | Admit: 2010-11-16 | Discharge: 2010-11-16 | Disposition: A | Discharge status: Home / Self Care | Payer: 59 | Source: Ambulatory Visit | Attending: Internal Medicine | Admitting: Internal Medicine

## 2010-11-16 DIAGNOSIS — Z1231 Encounter for screening mammogram for malignant neoplasm of breast: Secondary | ICD-10-CM | POA: Insufficient documentation

## 2010-11-18 ENCOUNTER — Other Ambulatory Visit: Payer: Self-pay | Admitting: Internal Medicine

## 2010-11-26 ENCOUNTER — Other Ambulatory Visit: Payer: Self-pay | Admitting: Internal Medicine

## 2011-03-26 ENCOUNTER — Other Ambulatory Visit: Payer: Self-pay | Admitting: Internal Medicine

## 2011-07-01 ENCOUNTER — Encounter: Payer: Self-pay | Admitting: Internal Medicine

## 2011-07-16 ENCOUNTER — Other Ambulatory Visit: Payer: Self-pay | Admitting: Internal Medicine

## 2011-08-05 ENCOUNTER — Encounter: Payer: Self-pay | Admitting: Internal Medicine

## 2011-08-05 ENCOUNTER — Ambulatory Visit (AMBULATORY_SURGERY_CENTER): Payer: 59 | Admitting: *Deleted

## 2011-08-05 ENCOUNTER — Telehealth: Payer: Self-pay | Admitting: *Deleted

## 2011-08-05 VITALS — Ht 66.0 in | Wt 146.0 lb

## 2011-08-05 DIAGNOSIS — Z1211 Encounter for screening for malignant neoplasm of colon: Secondary | ICD-10-CM

## 2011-08-05 MED ORDER — SUPREP BOWEL PREP KIT 17.5-3.13-1.6 GM/177ML PO SOLN: 1.0000 | ORAL | Status: DC

## 2011-08-05 NOTE — Telephone Encounter (Signed)
Faxed [facility] to get pt's information for upcoming colonoscopy.

## 2011-08-05 NOTE — Progress Notes (Signed)
Patient states she had colonoscopy 10 years ago in [place], with Diverticulosis findings only.  Release of information filled out and given to P.J. CMA.

## 2011-08-05 NOTE — Telephone Encounter (Signed)
Patient had colonoscopy 10 years ago in [place]. Pt states "diverticulosis" only findings. Release of information filled out and given to P.J. CMA. Patient for screening colonoscopy with Dr.Gessner. Thanks!

## 2011-08-05 NOTE — Progress Notes (Signed)
Patient unable to have aspartame, suprep given instead of moviprep. RM

## 2011-08-19 ENCOUNTER — Ambulatory Visit (AMBULATORY_SURGERY_CENTER): Payer: 59 | Admitting: Internal Medicine

## 2011-08-19 ENCOUNTER — Encounter: Payer: Self-pay | Admitting: Internal Medicine

## 2011-08-19 VITALS — BP 134/74 | HR 60 | Temp 97.7°F | Resp 18 | Ht 66.0 in | Wt 146.0 lb

## 2011-08-19 DIAGNOSIS — K5730 Diverticulosis of large intestine without perforation or abscess without bleeding: Secondary | ICD-10-CM

## 2011-08-19 DIAGNOSIS — D126 Benign neoplasm of colon, unspecified: Secondary | ICD-10-CM

## 2011-08-19 DIAGNOSIS — Z1211 Encounter for screening for malignant neoplasm of colon: Secondary | ICD-10-CM

## 2011-08-19 DIAGNOSIS — K649 Unspecified hemorrhoids: Secondary | ICD-10-CM

## 2011-08-19 DIAGNOSIS — K635 Polyp of colon: Secondary | ICD-10-CM

## 2011-08-19 MED ORDER — SODIUM CHLORIDE 0.9 % IV SOLN: 500.0000 mL | INTRAVENOUS | Status: DC

## 2011-08-19 NOTE — Progress Notes (Signed)
Patient did not experience any of the following events: a burn prior to discharge; a fall within the facility; wrong site/side/patient/procedure/implant event; or a hospital transfer or hospital admission upon discharge from the facility. (G8907) Patient did not have preoperative order for IV antibiotic SSI prophylaxis. (G8918)  

## 2011-08-19 NOTE — Op Note (Addendum)
[facility] [address] [place], [place]  [address]  COLONOSCOPY PROCEDURE REPORT  PATIENT:  Sheena Craig, Sheena Craig  MR#:  [id number] BIRTHDATE:  03/03/1940, 70 yrs. old  GENDER:  female ENDOSCOPIST:  Iva Boop, MD, [facility] REF. BY:  Marga Melnick, M.D. CORRECTION - Oliver Barre, MD PROCEDURE DATE:  08/19/2011 PROCEDURE:  Colonoscopy with biopsy ASA CLASS:  Class II INDICATIONS:  Routine Risk Screening MEDICATIONS:   These medications were titrated to patient response per physician's verbal order, Fentanyl 50 mcg, Versed 4 mg IV  DESCRIPTION OF PROCEDURE:   After the risks benefits and alternatives of the procedure were thoroughly explained, informed consent was obtained.  Digital rectal exam was performed and revealed no abnormalities.   The LB [id number]-[id number] endoscope was introduced through the anus and advanced to the cecum, which was identified by both the appendix and ileocecal valve, without limitations.  The quality of the prep was excellent, using MoviPrep.  The instrument was then slowly withdrawn as the colon was fully examined. <<PROCEDUREIMAGES>>  FINDINGS:  A diminutive polyp was found in the mid transverse colon. It was 2 mm in size. The polyp was removed during scope insertion using cold biopsy forceps.  Moderate diverticulosis was found in the left colon.  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  This was otherwise a normal examination of the colon. Includes right colon retroflexion.   Retroflexed views in the rectum revealed internal and external hemorrhoids.    The time to cecum = 4:38 minutes. The scope was then withdrawn in 8:45 minutes from the cecum and the procedure completed. COMPLICATIONS:  None ENDOSCOPIC IMPRESSION: 1) 2 mm diminutive polyp in the mid transverse colon - removed 2) Moderate diverticulosis in the left colon 3) Melanosis found scattered throughout the colon 4) Internal and external  hemorrhoids 5) Otherwise normal examination - excellent prep  REPEAT EXAM:  In for Colonoscopy, pending biopsy results.  Iva Boop, MD, [facility]  CC:  Oliver Barre, MD and The Patient  n. REVISED:  08/19/2011 10:41 AM eSIGNED:   Iva Boop at 08/19/2011 10:41 AM  Maudie Mercury, [id number]

## 2011-08-19 NOTE — Patient Instructions (Addendum)
One tiny polyp was removed. I do not think it is a problem.  You also have diverticulosis and hemorrhoids - please read the handouts.   Thank you for choosing me and [facility].  Iva Boop, MD, FACG  YOU HAD AN ENDOSCOPIC PROCEDURE TODAY AT THE [facility]: Refer to the procedure report that was given to you for any specific questions about what was found during the examination.  If the procedure report does not answer your questions, please call your gastroenterologist to clarify.  If you requested that your care partner not be given the details of your procedure findings, then the procedure report has been included in a sealed envelope for you to review at your convenience later.  YOU SHOULD EXPECT: Some feelings of bloating in the abdomen. Passage of more gas than usual.  Walking can help get rid of the air that was put into your GI tract during the procedure and reduce the bloating. If you had a lower endoscopy (such as a colonoscopy or flexible sigmoidoscopy) you may notice spotting of blood in your stool or on the toilet paper. If you underwent a bowel prep for your procedure, then you may not have a normal bowel movement for a few days.  DIET: Your first meal following the procedure should be a light meal and then it is ok to progress to your normal diet.  A half-sandwich or bowl of soup is an example of a good first meal.  Heavy or fried foods are harder to digest and may make you feel nauseous or bloated.  Likewise meals heavy in dairy and vegetables can cause extra gas to form and this can also increase the bloating.  Drink plenty of fluids but you should avoid alcoholic beverages for 24 hours.  ACTIVITY: Your care partner should take you home directly after the procedure.  You should plan to take it easy, moving slowly for the rest of the day.  You can resume normal activity the day after the procedure however you should NOT DRIVE or use heavy machinery for  24 hours (because of the sedation medicines used during the test).    SYMPTOMS TO REPORT IMMEDIATELY: A gastroenterologist can be reached at any hour.  During normal business hours, 8:30 AM to 5:00 PM Monday through Friday, call [phone].  After hours and on weekends, please call the GI answering service at [phone] who will take a message and have the physician on call contact you.   Following lower endoscopy (colonoscopy or flexible sigmoidoscopy):  Excessive amounts of blood in the stool  Significant tenderness or worsening of abdominal pains  Swelling of the abdomen that is new, acute  Fever of 100F or higher  Following upper endoscopy (EGD)  Vomiting of blood or coffee ground material  New chest pain or pain under the shoulder blades  Painful or persistently difficult swallowing  New shortness of breath  Fever of 100F or higher  Black, tarry-looking stools  FOLLOW UP: If any biopsies were taken you will be contacted by phone or by letter within the next 1-3 weeks.  Call your gastroenterologist if you have not heard about the biopsies in 3 weeks.  Our staff will call the home number listed on your records the next business day following your procedure to check on you and address any questions or concerns that you may have at that time regarding the information given to you following your procedure. This is a courtesy call and so  if there is no answer at the home number and we have not heard from you through the emergency physician on call, we will assume that you have returned to your regular daily activities without incident.  SIGNATURES/CONFIDENTIALITY: You and/or your care partner have signed paperwork which will be entered into your electronic medical record.  These signatures attest to the fact that that the information above on your After Visit Summary has been reviewed and is understood.  Full responsibility of the confidentiality of this discharge information lies  with you and/or your care-partner.    Polyp, high fiber diet, diverticulosis, and hemorrhoid information given.

## 2011-08-20 ENCOUNTER — Telehealth: Payer: Self-pay | Admitting: *Deleted

## 2011-08-20 NOTE — Telephone Encounter (Signed)
  Follow up Call-  Call back number 08/19/2011  Post procedure Call Back phone  # [phone]  Permission to leave phone message Yes     Patient questions:  Do you have a fever, pain , or abdominal swelling? no Pain Score  0 *  Have you tolerated food without any problems? yes  Have you been able to return to your normal activities? yes  Do you have any questions about your discharge instructions: Diet   no Medications  no Follow up visit  no  Do you have questions or concerns about your Care? yes  Actions: * If pain score is 4 or above: No action needed, pain <4.

## 2011-08-22 ENCOUNTER — Encounter: Payer: Self-pay | Admitting: Internal Medicine

## 2011-08-22 NOTE — Progress Notes (Signed)
Quick Note:  Hyperplastic polyp Repeat colon about 08/2021 ______

## 2011-10-14 ENCOUNTER — Other Ambulatory Visit (INDEPENDENT_AMBULATORY_CARE_PROVIDER_SITE_OTHER): Payer: 59

## 2011-10-14 DIAGNOSIS — Z0000 Encounter for general adult medical examination without abnormal findings: Secondary | ICD-10-CM

## 2011-10-14 DIAGNOSIS — Z79899 Other long term (current) drug therapy: Secondary | ICD-10-CM

## 2011-10-14 LAB — HEPATIC FUNCTION PANEL
AST: 22 U/L (ref 0–37)
Alkaline Phosphatase: 50 U/L (ref 39–117)
Bilirubin, Direct: 0 mg/dL (ref 0.0–0.3)
Total Bilirubin: 0.5 mg/dL (ref 0.3–1.2)

## 2011-10-14 LAB — BASIC METABOLIC PANEL
BUN: 16 mg/dL (ref 6–23)
CO2: 24 mEq/L (ref 19–32)
Calcium: 8.8 mg/dL (ref 8.4–10.5)
Creatinine, Ser: 0.8 mg/dL (ref 0.4–1.2)
Glucose, Bld: 85 mg/dL (ref 70–99)

## 2011-10-14 LAB — URINALYSIS, ROUTINE W REFLEX MICROSCOPIC
Bilirubin Urine: NEGATIVE
Nitrite: NEGATIVE
Total Protein, Urine: NEGATIVE
Urine Glucose: NEGATIVE
pH: 6.5 (ref 5.0–8.0)

## 2011-10-14 LAB — CBC WITH DIFFERENTIAL/PLATELET
Basophils Absolute: 0 10*3/uL (ref 0.0–0.1)
Lymphocytes Relative: 27.3 % (ref 12.0–46.0)
Monocytes Relative: 12.5 % — ABNORMAL HIGH (ref 3.0–12.0)
Platelets: 339 10*3/uL (ref 150.0–400.0)
RDW: 15.6 % — ABNORMAL HIGH (ref 11.5–14.6)
WBC: 5.1 10*3/uL (ref 4.5–10.5)

## 2011-10-14 LAB — TSH: TSH: 6.43 u[IU]/mL — ABNORMAL HIGH (ref 0.35–5.50)

## 2011-10-14 LAB — LDL CHOLESTEROL, DIRECT: Direct LDL: 143.9 mg/dL

## 2011-10-14 LAB — LIPID PANEL: HDL: 47.5 mg/dL (ref >39.00)

## 2011-10-15 LAB — VITAMIN B12: Vitamin B-12: 172 pg/mL — ABNORMAL LOW (ref 211–911)

## 2011-10-15 LAB — IBC PANEL: Saturation Ratios: 7.5 % — ABNORMAL LOW (ref 20.0–50.0)

## 2011-10-21 ENCOUNTER — Ambulatory Visit (INDEPENDENT_AMBULATORY_CARE_PROVIDER_SITE_OTHER)
Admission: RE | Admit: 2011-10-21 | Discharge: 2011-10-21 | Disposition: A | Discharge status: Home / Self Care | Payer: 59 | Source: Ambulatory Visit | Attending: Internal Medicine | Admitting: Internal Medicine

## 2011-10-21 ENCOUNTER — Encounter: Payer: Self-pay | Admitting: Internal Medicine

## 2011-10-21 ENCOUNTER — Ambulatory Visit (INDEPENDENT_AMBULATORY_CARE_PROVIDER_SITE_OTHER): Payer: 59 | Admitting: Internal Medicine

## 2011-10-21 VITALS — BP 118/62 | HR 83 | Temp 98.2°F | Ht 66.0 in | Wt 146.2 lb

## 2011-10-21 DIAGNOSIS — E039 Hypothyroidism, unspecified: Secondary | ICD-10-CM

## 2011-10-21 DIAGNOSIS — Z0000 Encounter for general adult medical examination without abnormal findings: Secondary | ICD-10-CM

## 2011-10-21 DIAGNOSIS — R05 Cough: Secondary | ICD-10-CM

## 2011-10-21 DIAGNOSIS — Z136 Encounter for screening for cardiovascular disorders: Secondary | ICD-10-CM

## 2011-10-21 DIAGNOSIS — R059 Cough, unspecified: Secondary | ICD-10-CM

## 2011-10-21 DIAGNOSIS — D649 Anemia, unspecified: Secondary | ICD-10-CM

## 2011-10-21 DIAGNOSIS — E785 Hyperlipidemia, unspecified: Secondary | ICD-10-CM

## 2011-10-21 DIAGNOSIS — Z23 Encounter for immunization: Secondary | ICD-10-CM

## 2011-10-21 MED ORDER — LEVOTHYROXINE SODIUM 125 MCG PO TABS: 125.0000 ug | ORAL_TABLET | Freq: Every day | ORAL | Status: DC

## 2011-10-21 NOTE — Assessment & Plan Note (Signed)
New onset from last yr, had recent colonsocpy, last EGD 2007, for iron 325 qd, colace 100qd, refer GI  - may need egd repeat

## 2011-10-21 NOTE — Assessment & Plan Note (Signed)
?   Simply related to post nasal gtt/allergies, for cxr today, otc allegra, consider pulm if persists or worsens

## 2011-10-21 NOTE — Assessment & Plan Note (Signed)
TSH mild elev- to inr the synthroid to 125 from 100, f/u tsh 4 wks

## 2011-10-21 NOTE — Progress Notes (Signed)
Subjective:    Patient ID: Sheena Craig, female    DOB: [birth year], 71 y.o.   MRN: [id number]  HPI  Here for wellness and f/u;  Overall doing ok;  Pt denies CP, worsening SOB, DOE, wheezing, orthopnea, PND, worsening LE edema, palpitations, dizziness or syncope.  Pt denies neurological change such as new Headache, facial or extremity weakness.  Pt denies polydipsia, polyuria, or low sugar symptoms. Pt states overall good compliance with treatment and medications, good tolerability, and trying to follow lower cholesterol diet.  Pt denies worsening depressive symptoms, suicidal ideation or panic. No fever, wt loss, night sweats, loss of appetite, or other constitutional symptoms.  Pt states good ability with ADL's, low fall risk, home safety reviewed and adequate, no significant changes in hearing or vision, and occasionally active with exercise.  Really has no major complaints, but does state has an unusual nonprod cough worse in the past mo, has hx of remote sarcoid, but also with recent nasal allergy symptoms mild in the past few wks as well.  Denies worsening reflux, dysphagia, abd pain, n/v, bowel change or blood.  Also of note, she gives herself her B12 shot at home, but admits to not doing so recently until just a wk ago.  No overt bleeding/bruising,  Denies hyper or hypo thyroid symptoms such as voice, skin or hair change.  Admits to occasional diet noncompliance with ice cream Past Medical History  Diagnosis Date  . ANEMIA-NOS 10/17/2006  . DEPRESSION 10/17/2006  . FIBROMYALGIA 02/09/2007  . GERD 10/17/2006  . HYPERLIPIDEMIA 10/17/2006  . HYPOTHYROIDISM 10/17/2006  . LUMBAR RADICULOPATHY, LEFT 10/06/2009  . MIGRAINE HEADACHE 10/17/2006  . OSTEOARTHRITIS 10/17/2006  . OSTEOPENIA 10/17/2006  . OSTEOPOROSIS 02/09/2007  . Sarcoidosis 10/17/2006   Past Surgical History  Procedure Date  . Lymph node biopsy 1968  . Cataract extraction   . Colonoscopy     reports that she has never smoked. She  has never used smokeless tobacco. She reports that she does not drink alcohol or use illicit drugs. family history includes Alcohol abuse in her other; Anxiety disorder in her other; Heart disease in her other; and Hypertension in her other.  There is no history of Colon cancer. Allergies  Allergen Reactions  . Aspartame And Phenylalanine Other (See Comments)    GI upset,&pain  . Pravastatin Sodium     REACTION: GI upset  . Pseudoephedrine Other (See Comments)    "hyperactive"   Current Outpatient Prescriptions on File Prior to Visit  Medication Sig Dispense Refill  . B-D 3CC LUER-LOK SYR 25GX1" 25G X 1" 3 ML MISC USE FOR B12 SHOTS ONCE A MONTH  12 each  1  . cyanocobalamin (,VITAMIN B-12,) 1000 MCG/ML injection INJECT 1 ML INTRAMUSCULARLY MONTHLY AS DIRECTED  10 mL  2  . ibuprofen (ADVIL,MOTRIN) 200 MG tablet Take 200 mg by mouth daily.        . Nutritional Supplements (JUICE PLUS FIBRE PO) Take 2 capsules by mouth daily.      . Syringe/Needle, Disp, 25G X 1" 1 ML MISC Use as directed once every month for B12 injections.       . WELCHOL 3.75 G PACK TAKE 1 PACKET BY MOUTH ONCE DAILY  90 each  0  . FLUoxetine (PROZAC) 20 MG capsule Take 1 capsule (20 mg total) by mouth daily.  90 capsule  3  . naproxen sodium (ANAPROX) 220 MG tablet Take 220 mg by mouth daily.  Review of Systems Review of Systems  Constitutional: Negative for diaphoresis, activity change, appetite change and unexpected weight change.  HENT: Negative for hearing loss, ear pain, facial swelling, mouth sores and neck stiffness.   Eyes: Negative for pain, redness and visual disturbance.  Respiratory: Negative for shortness of breath and wheezing.   Cardiovascular: Negative for chest pain and palpitations.  Gastrointestinal: Negative for diarrhea, blood in stool, abdominal distention and rectal pain.  Genitourinary: Negative for hematuria, flank pain and decreased urine volume.  Musculoskeletal: Negative for myalgias  and joint swelling.  Skin: Negative for color change and wound.  Neurological: Negative for syncope and numbness.  Hematological: Negative for adenopathy.  Psychiatric/Behavioral: Negative for hallucinations, self-injury, decreased concentration and agitation.      Objective:   Physical Exam BP 118/62  Pulse 83  Temp 98.2 F (36.8 C) (Oral)  Ht 5\' 6"  (1.676 m)  Wt 146 lb 4 oz (66.339 kg)  BMI 23.61 kg/m2  SpO2 94% Physical Exam  VS noted Constitutional: Pt is oriented to person, place, and time. Appears well-developed and well-nourished.  HENT:  Head: Normocephalic and atraumatic.  Right Ear: External ear normal.  Left Ear: External ear normal.  Nose: Nose normal.  Mouth/Throat: Oropharynx is clear and moist.  Eyes: Conjunctivae and EOM are normal. Pupils are equal, round, and reactive to light.  Neck: Normal range of motion. Neck supple. No JVD present. No tracheal deviation present.  Cardiovascular: Normal rate, regular rhythm, normal heart sounds and intact distal pulses.   Pulmonary/Chest: Effort normal and breath sounds normal.  Abdominal: Soft. Bowel sounds are normal. There is no tenderness.  Musculoskeletal: Normal range of motion. Exhibits no edema.  Lymphadenopathy:  Has no cervical adenopathy.  Neurological: Pt is alert and oriented to person, place, and time. Pt has normal reflexes. No cranial nerve deficit.  Skin: Skin is warm and dry. No rash noted.  Psychiatric:  Has  normal mood and affect. Behavior is normal.     Assessment & Plan:

## 2011-10-21 NOTE — Assessment & Plan Note (Addendum)

## 2011-10-21 NOTE — Assessment & Plan Note (Signed)
Pt ist statin intolerant, and admits to noncomplaicne with welchol - to re-start, follow lower chol diet

## 2011-10-21 NOTE — Assessment & Plan Note (Signed)
TSH mild elev- to inr the synthroid to 125 from 100, f/u tsh 4 wks  

## 2011-10-21 NOTE — Patient Instructions (Addendum)
You had the flu shot today OK to increase the levothryoxine to 125 mcg per day Please return in 4 wks for LAB only - TSH OK to try OTC allegra 180 mg per day to see if this helps the cough Please go to XRAY in the Basement for the x-ray test If the CXR is negative and the cough persists or worsens, please call in 1-2 wks for pulmonary referral Please start OTC iron sulfate 325 mg - 1 per day (along with colace 100 mg per day stool softner) You will be contacted regarding the referral for: Gastroenterology - Dr Leone Payor Please make sure you take your B12 shots monthly Please continue your efforts at being more active, low cholesterol diet, and weight control. You will be contacted by phone if any changes need to be made immediately.  Otherwise, you will receive a letter about your results with an explanation. Please remember to sign up for My Chart at your earliest convenience, as this will be important to you in the future with finding out test results. Please return in 6 months, or sooner if needed

## 2011-10-22 NOTE — Telephone Encounter (Signed)
*RADIOLOGY REPORT*  Clinical Data: Recent cough and fever. Sarcoidosis.  CHEST - 2 VIEW  Comparison: 06/02/2008  Findings: Lungs are hyperexpanded. No edema or pleural effusion.  Hazy opacity at the cardiac apex is stable. There is a new small  focal opacity seen at the right base on the frontal projection.  The heart size is normal. Imaged bony structures of the thorax are  intact.  IMPRESSION:  New small density at the right base may be atelectatic, but follow-  up is recommended to ensure complete resolution.  Original Report Authenticated By: ERIC A. MANSELL, M.D.          Result Notes     Notes Recorded by Vladimir Crofts Ewing on 10/21/2011 at 2:03 PM Called left message to call back ------  Notes Recorded by Corwin Levins, MD on 10/21/2011 at 1:05 PM Letter sent, cont same tx  The test results show that your current treatment is OK, except there is the suggestion of a nodule to the right lower lung. This is not clear and may just be a "shadow" on the xray (benign). We should repeat your chest xray in 1 week. If still present, we may need to recommend a CT scan to further evaluate. There is no other need for change of treatment or further evaluation based on these results at this time. Please continue the same plan for further evaluation and treatment as discussed at your office visit.   Robin to inform pt and do future lab ------  Notes Recorded by Corwin Levins, MD on 10/21/2011 at 1:05 PM Letter sent, cont same tx except:  The test results show that your current treatment is OK, except there is the suggestion of a nodule to the right lower lung. This is not clear and may just be a "shadow" on the xray (benign). We should repeat your chest xray in 1 week. If still present, we may need to recommend a CT scan to further evaluate. There is no other need for change of treatment or further evaluation based on these results at this time. Please continue the same plan for further evaluation  and treatment as discussed at your office visit.   Robin to inform pt and place future order for the cxr for "pulm nodule"         External Result Report          External Result Report            Imaging     Imaging Information            Signed by       Signed  Date/Time    Phone  Pager    MANSELL, ERIC A  10/21/2011  10:03 AM EDT  [phone]  [phone]          Exam Information       Status  Exam Begun    Exam Ended       Final [99]  10/21/2011  9:49 AM EDT  10/21/2011  9:57 AM EDT          Result Notes     Notes Recorded by Vladimir Crofts Ewing on 10/21/2011 at 2:03 PM Called left message to call back ------  Notes Recorded by Corwin Levins, MD on 10/21/2011 at 1:05 PM Letter sent, cont same tx  The test results show that your current treatment is OK, except there is the suggestion of a nodule to the right lower lung. This is not  clear and may just be a "shadow" on the xray (benign). We should repeat your chest xray in 1 week. If still present, we may need to recommend a CT scan to further evaluate. There is no other need for change of treatment or further evaluation based on these results at this time. Please continue the same plan for further evaluation and treatment as discussed at your office visit.   Robin to inform pt and do future lab ------  Notes Recorded by Corwin Levins, MD on 10/21/2011 at 1:05 PM Letter sent, cont same tx except:  The test results show that your current treatment is OK, except there is the suggestion of a nodule to the right lower lung. This is not clear and may just be a "shadow" on the xray (benign). We should repeat your chest xray in 1 week. If still present, we may need to recommend a CT scan to further evaluate. There is no other need for change of treatment or further evaluation based on these results at this time. Please continue the same plan for further evaluation and treatment as discussed at your office  visit.   Robin to inform pt and place future order for the cxr for "pulm nodule"

## 2011-10-22 NOTE — Telephone Encounter (Signed)
Ok for robin to call pt again  - see below

## 2011-10-24 ENCOUNTER — Other Ambulatory Visit: Payer: Self-pay | Admitting: Internal Medicine

## 2011-10-24 DIAGNOSIS — Z1231 Encounter for screening mammogram for malignant neoplasm of breast: Secondary | ICD-10-CM

## 2011-10-28 ENCOUNTER — Ambulatory Visit (INDEPENDENT_AMBULATORY_CARE_PROVIDER_SITE_OTHER)
Admission: RE | Admit: 2011-10-28 | Discharge: 2011-10-28 | Disposition: A | Discharge status: Home / Self Care | Payer: 59 | Source: Ambulatory Visit | Attending: Internal Medicine | Admitting: Internal Medicine

## 2011-10-28 ENCOUNTER — Telehealth: Payer: Self-pay

## 2011-10-28 DIAGNOSIS — R918 Other nonspecific abnormal finding of lung field: Secondary | ICD-10-CM

## 2011-10-28 DIAGNOSIS — R9389 Abnormal findings on diagnostic imaging of other specified body structures: Secondary | ICD-10-CM

## 2011-10-28 NOTE — Telephone Encounter (Signed)
Her thyroid med was increased from 100 to 125  This would be very unlikely to be related to any rash  OK to cont same med, but consider taking allegra or benadryl if itches, or topical cortisone  Consider OV if rash is painful or o/w gets worse, or have fever or swelling

## 2011-10-28 NOTE — Telephone Encounter (Signed)
Patient informing she has rash on stomach thinks may be reaction to new medication, change made on 10/21/11. Call back number is [phone]

## 2011-10-28 NOTE — Telephone Encounter (Signed)
Patient informed of MD instructions. 

## 2011-10-28 NOTE — Telephone Encounter (Signed)
Called left message to call back 

## 2011-11-01 ENCOUNTER — Encounter: Payer: Self-pay | Admitting: Internal Medicine

## 2011-11-20 ENCOUNTER — Encounter: Payer: Self-pay | Admitting: Internal Medicine

## 2011-11-20 ENCOUNTER — Ambulatory Visit (INDEPENDENT_AMBULATORY_CARE_PROVIDER_SITE_OTHER): Payer: 59 | Admitting: Internal Medicine

## 2011-11-20 ENCOUNTER — Other Ambulatory Visit (INDEPENDENT_AMBULATORY_CARE_PROVIDER_SITE_OTHER): Payer: 59

## 2011-11-20 VITALS — BP 128/60 | HR 84 | Ht 65.75 in | Wt 146.4 lb

## 2011-11-20 DIAGNOSIS — D509 Iron deficiency anemia, unspecified: Secondary | ICD-10-CM

## 2011-11-20 DIAGNOSIS — E538 Deficiency of other specified B group vitamins: Secondary | ICD-10-CM

## 2011-11-20 DIAGNOSIS — E039 Hypothyroidism, unspecified: Secondary | ICD-10-CM

## 2011-11-20 DIAGNOSIS — Z791 Long term (current) use of non-steroidal anti-inflammatories (NSAID): Secondary | ICD-10-CM

## 2011-11-20 LAB — IGA: IgA: 183 mg/dL (ref 68–378)

## 2011-11-20 LAB — FERRITIN: Ferritin: 16.4 ng/mL (ref 10.0–291.0)

## 2011-11-20 NOTE — Patient Instructions (Addendum)
You have been scheduled for an endoscopy with propofol. Please follow written instructions given to you at your visit today. If you use inhalers (even only as needed) or a CPAP machine, please bring them with you on the day of your procedure.  Your physician has requested that you go to the basement for lab work before leaving today.  Thank you for choosing me and  Gastroenterology.  Carl E. Gessner, M.D., FACG   

## 2011-11-20 NOTE — Progress Notes (Signed)
Subjective:    Patient ID: Sheena Craig, female    DOB: Nov 10, 1940, 71 y.o.   MRN: [id number]  HPI This is a very pleasant elderly woman here for evaluation of a new anemia. She had Hgb 11.8. B12 level was low though she had not always taken the B12 full dose lately. Her husband has pernicious anemia, diagnosed after being in the [place] 1995-2000. She was also diagnosed then but did not have testing for parietal cell antibodies and intrinsic factor antibodies.   She has not had bleeding and her colonoscopy was unrevealing (hemorrhoids, diverticulosis and diminutive hyperplastic polyp).  She has had years of gas and bloating. Occasional constipation. GI ROS otherwise negative.  Recently had synthroid dose increased. Started ferrous sulfate 1 month ago.  Allergies  Allergen Reactions  . Allegra (Fexofenadine Hcl)   . Aspartame And Phenylalanine Other (See Comments)    GI upset,&pain  . Pravastatin Sodium     REACTION: GI upset  . Pseudoephedrine Other (See Comments)    "hyperactive"   Outpatient Prescriptions Prior to Visit  Medication Sig Dispense Refill  . cyanocobalamin (,VITAMIN B-12,) 1000 MCG/ML injection INJECT 1 ML INTRAMUSCULARLY MONTHLY AS DIRECTED  10 mL  2  . levothyroxine (SYNTHROID, LEVOTHROID) 125 MCG tablet Take 1 tablet (125 mcg total) by mouth daily.  90 tablet  3  . naproxen sodium (ANAPROX) 220 MG tablet Take 220 mg by mouth daily.        . Nutritional Supplements (JUICE PLUS FIBRE PO) Take 2 capsules by mouth daily.      . Syringe/Needle, Disp, 25G X 1" 1 ML MISC Use as directed once every month for B12 injections.       Lilian Kapur 3.75 G PACK TAKE 1 PACKET BY MOUTH ONCE DAILY  90 each  0  . [DISCONTINUED] FLUoxetine (PROZAC) 20 MG capsule Take 1 capsule (20 mg total) by mouth daily.  90 capsule  3  . [DISCONTINUED] ibuprofen (ADVIL,MOTRIN) 200 MG tablet Take 200 mg by mouth daily.         Last reviewed on 11/20/2011  8:53 AM by Iva Boop,  MD Past Medical History  Diagnosis Date  . ANEMIA-NOS 10/17/2006  . DEPRESSION 10/17/2006  . FIBROMYALGIA 02/09/2007  . GERD 10/17/2006  . HYPERLIPIDEMIA 10/17/2006  . HYPOTHYROIDISM 10/17/2006  . LUMBAR RADICULOPATHY, LEFT 10/06/2009  . MIGRAINE HEADACHE 10/17/2006  . OSTEOARTHRITIS 10/17/2006  . OSTEOPENIA 10/17/2006  . OSTEOPOROSIS 02/09/2007  . Sarcoidosis 10/17/2006   Past Surgical History  Procedure Date  . Lymph node biopsy 1968  . Cataract extraction     bilateral  . Colonoscopy   . Upper gastrointestinal endoscopy    History   Social History  . Marital Status: Married    Spouse Name: N/A    Number of Children: 2  .     Occupational History  . psychology    Social History Main Topics  . Smoking status: Never Smoker   . Smokeless tobacco: Never Used  . Alcohol Use: No  . Drug Use: No  . Sexually Active: None     Family History  Problem Relation Age of Onset  . Alcohol abuse Father   . Anxiety disorder Father   . Heart disease Father   . Hypertension Mother   . Colon cancer Neg Hx   . Anxiety disorder Mother   . Heart disease Mother   . Celiac disease Other     nieces and nephews  . Breast cancer Maternal Aunt   .  Uterine cancer Sister     Review of Systems + recent papular rash on abdominal wall after taking allegra, all other ROS negative or as per HPI    Objective:   Physical Exam General:  Well-developed, well-nourished and in no acute distress Eyes:  anicteric. ENT:   Mouth and posterior pharynx free of lesions.  Neck:   supple w/o thyromegaly or mass.  Lungs: Clear to auscultation bilaterally. Heart:  S1S2, no rubs, murmurs, gallops. Abdomen:  soft, non-tender, no hepatosplenomegaly, hernia, or mass and BS+.  Lymph:  no cervical or supraclavicular adenopathy. Extremities:   no edema Skin   fading papular rash on abdominal wall, + vitilligo Neuro:  A&O x 3.  Psych:  appropriate mood and  Affect.   Data Reviewed: Lab Results  Component  Value Date   WBC 5.1 10/14/2011   HGB 11.8* 10/14/2011   HCT 35.9* 10/14/2011   MCV 85.4 10/14/2011   PLT 339.0 10/14/2011   Lab Results  Component Value Date   VITAMINB12 172* 10/14/2011   Lab Results  Component Value Date   IRON 31* 10/14/2011   Lab Results  Component Value Date   TSH 6.43* 10/14/2011    Colonoscopy and pathology reports, PCP Oct 2013 note       Assessment & Plan:   1. Iron deficiency anemia, unspecified   2. B12 deficiency   3. Long term current use of non-steroidal anti-inflammatories (NSAID)    1. Ferritin, anti-parietal cell Ab, anti-intrinsic factor Ab, TTG Ab and IgA, EGD The risks and benefits as well as alternatives of endoscopic procedure(s) have been discussed and reviewed. All questions answered. The patient agrees to proceed. ? Celiac, pernicious anemia, NSAID damage. Will likely need regular PPI prophylaxis. ? Tropical sprue (unlikely)  I appreciate the opportunity to care for this patient.  NW:GNFAO Jonny Ruiz, MD

## 2011-11-21 LAB — TISSUE TRANSGLUTAMINASE, IGA: Tissue Transglutaminase Ab, IgA: 3.8 U/mL (ref <20)

## 2011-11-21 LAB — ANTI-PARIETAL ANTIBODY: Parietal Cell Antibody-IgG: POSITIVE — AB

## 2011-11-22 ENCOUNTER — Encounter: Payer: Self-pay | Admitting: Internal Medicine

## 2011-11-22 LAB — INTRINSIC FACTOR ANTIBODIES: Intrinsic Factor: POSITIVE — AB

## 2011-11-25 NOTE — Telephone Encounter (Signed)
I have left a message for the patient to call back and have asked her in a my chart message

## 2011-11-25 NOTE — Telephone Encounter (Signed)
Patient did call back.  I have left another voicemail for her to call

## 2011-11-26 NOTE — Telephone Encounter (Signed)
Discussed with the patient and all questions answered about endoscopy.  She will keep her upcoming appt

## 2011-12-10 ENCOUNTER — Ambulatory Visit (HOSPITAL_BASED_OUTPATIENT_CLINIC_OR_DEPARTMENT_OTHER)
Admission: RE | Admit: 2011-12-10 | Discharge: 2011-12-10 | Disposition: A | Discharge status: Home / Self Care | Payer: Medicare PPO | Source: Ambulatory Visit | Attending: Internal Medicine | Admitting: Internal Medicine

## 2011-12-10 DIAGNOSIS — Z1231 Encounter for screening mammogram for malignant neoplasm of breast: Secondary | ICD-10-CM | POA: Insufficient documentation

## 2011-12-11 ENCOUNTER — Encounter: Payer: Self-pay | Admitting: Internal Medicine

## 2011-12-11 ENCOUNTER — Inpatient Hospital Stay (HOSPITAL_BASED_OUTPATIENT_CLINIC_OR_DEPARTMENT_OTHER): Admission: RE | Admit: 2011-12-11 | Payer: 59 | Source: Ambulatory Visit

## 2011-12-11 ENCOUNTER — Ambulatory Visit (AMBULATORY_SURGERY_CENTER): Payer: 59 | Admitting: Internal Medicine

## 2011-12-11 VITALS — BP 120/73 | HR 59 | Temp 97.4°F | Resp 28 | Ht 65.0 in | Wt 146.0 lb

## 2011-12-11 DIAGNOSIS — D510 Vitamin B12 deficiency anemia due to intrinsic factor deficiency: Secondary | ICD-10-CM

## 2011-12-11 DIAGNOSIS — D509 Iron deficiency anemia, unspecified: Secondary | ICD-10-CM

## 2011-12-11 DIAGNOSIS — K2940 Chronic atrophic gastritis without bleeding: Secondary | ICD-10-CM

## 2011-12-11 HISTORY — DX: Vitamin B12 deficiency anemia due to intrinsic factor deficiency: D51.0

## 2011-12-11 LAB — HM MAMMOGRAPHY: HM Mammogram: NEGATIVE

## 2011-12-11 MED ORDER — SODIUM CHLORIDE 0.9 % IV SOLN: 500.0000 mL | INTRAVENOUS | Status: DC

## 2011-12-11 NOTE — Progress Notes (Signed)
Patient did not experience any of the following events: a burn prior to discharge; a fall within the facility; wrong site/side/patient/procedure/implant event; or a hospital transfer or hospital admission upon discharge from the facility. (G8907) Patient did not have preoperative order for IV antibiotic SSI prophylaxis. (G8918)  

## 2011-12-11 NOTE — Op Note (Signed)
[facility] [address] [place], [address]   ENDOSCOPY PROCEDURE REPORT  PATIENT: Samiya, Carns  MR#: [id number] BIRTHDATE: [birth year] , 71  yrs. old GENDER: Female ENDOSCOPIST: Iva Boop, MD, [facility] REFERRED BY:  Oliver Barre PROCEDURE DATE:  12/11/2011 PROCEDURE:  EGD w/ biopsy for H.pylori ASA CLASS:     Class II INDICATIONS:  Iron deficiency anemia.   Pernicious anemia. MEDICATIONS: Propofol (Diprivan) 230 mg IV, MAC sedation, administered by CRNA, and These medications were titrated to patient response per physician's verbal order TOPICAL ANESTHETIC: none  DESCRIPTION OF PROCEDURE: After the risks benefits and alternatives of the procedure were thoroughly explained, informed consent was obtained.  The LB GIF-H180 [id number] endoscope was introduced through the mouth and advanced to the second portion of the duodenum. Without limitations.  The instrument was slowly withdrawn as the mucosa was fully examined.        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  A biopsy was performed using cold forceps.  Sample obtained for helicobacter pylori testing using rapid urease test.  The remainder of the upper endoscopy exam was otherwise normal. Retroflexed views revealed no abnormalities.     The scope was then withdrawn from the patient and the procedure completed.  COMPLICATIONS: There were no complications. ENDOSCOPIC IMPRESSION: 1.   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 pylori taken 2.   The remainder of the upper endoscopy exam was otherwise normal  RECOMMENDATIONS: 1.  Await biopsy results 2.  Office will call/My Chart communicate with results/plans 3.  PPI qam (OTC is fine) to reduce risk of ulcer and bleeding in setting of chronic NSAID use 4.  Vit C or acidic drink with ferrous sulfate   eSigned:  Iva Boop, MD, [facility]  12/11/2011 12:56 PM   XB:JYNWG Ellin Mayhew, MD and The Patient

## 2011-12-11 NOTE — Patient Instructions (Addendum)
The stomach is atrophic meaning the surface is flat and somewhat atrophied. This is consistent with pernicious anemia. I took a biopsy to see if you have an infection in the stomach that could be a part of this. I will let you know.  I recommend you take vitamin C or an acidic drink with your iron supplement and try to take 2 iron tablets daily.  Given your chronic daily naproxen use, it is recommended that you take a PPI  Every day(Prilosec, Prevacid or generic equivalents) which can be obtained from over the counter pharmacy.  Thank you for choosing me and [facility].  Iva Boop, MD, FACG  YOU HAD AN ENDOSCOPIC PROCEDURE TODAY AT THE [facility]: Refer to the procedure report that was given to you for any specific questions about what was found during the examination.  If the procedure report does not answer your questions, please call your gastroenterologist to clarify.  If you requested that your care partner not be given the details of your procedure findings, then the procedure report has been included in a sealed envelope for you to review at your convenience later.  YOU SHOULD EXPECT: Some feelings of bloating in the abdomen. Passage of more gas than usual.  Walking can help get rid of the air that was put into your GI tract during the procedure and reduce the bloating. If you had a lower endoscopy (such as a colonoscopy or flexible sigmoidoscopy) you may notice spotting of blood in your stool or on the toilet paper. If you underwent a bowel prep for your procedure, then you may not have a normal bowel movement for a few days.  DIET: Your first meal following the procedure should be a light meal and then it is ok to progress to your normal diet.  A half-sandwich or bowl of soup is an example of a good first meal.  Heavy or fried foods are harder to digest and may make you feel nauseous or bloated.  Likewise meals heavy in dairy and vegetables can cause extra gas  to form and this can also increase the bloating.  Drink plenty of fluids but you should avoid alcoholic beverages for 24 hours.  ACTIVITY: Your care partner should take you home directly after the procedure.  You should plan to take it easy, moving slowly for the rest of the day.  You can resume normal activity the day after the procedure however you should NOT DRIVE or use heavy machinery for 24 hours (because of the sedation medicines used during the test).    SYMPTOMS TO REPORT IMMEDIATELY: A gastroenterologist can be reached at any hour.  During normal business hours, 8:30 AM to 5:00 PM Monday through Friday, call [phone].  After hours and on weekends, please call the GI answering service at [phone] who will take a message and have the physician on call contact you.   Following lower endoscopy (colonoscopy or flexible sigmoidoscopy):  Excessive amounts of blood in the stool  Significant tenderness or worsening of abdominal pains  Swelling of the abdomen that is new, acute  Fever of 100F or higher  Following upper endoscopy (EGD)  Vomiting of blood or coffee ground material  New chest pain or pain under the shoulder blades  Painful or persistently difficult swallowing  New shortness of breath  Fever of 100F or higher  Black, tarry-looking stools  FOLLOW UP: If any biopsies were taken you will be contacted by phone or by letter  within the next 1-3 weeks.  Call your gastroenterologist if you have not heard about the biopsies in 3 weeks.  Our staff will call the home number listed on your records the next business day following your procedure to check on you and address any questions or concerns that you may have at that time regarding the information given to you following your procedure. This is a courtesy call and so if there is no answer at the home number and we have not heard from you through the emergency physician on call, we will assume that you have returned to your  regular daily activities without incident.  SIGNATURES/CONFIDENTIALITY: You and/or your care partner have signed paperwork which will be entered into your electronic medical record.  These signatures attest to the fact that that the information above on your After Visit Summary has been reviewed and is understood.  Full responsibility of the confidentiality of this discharge information lies with you and/or your care-partner.   Please follow all discharge instructions given to you by the recovery room nurse. If you have any questions or problems after discharge please call one of the numbers listed above. You will receive a phone call in the am to see how you are doing and answer any questions you may have. Thank you for choosing [facility] for your health care needs.

## 2011-12-13 ENCOUNTER — Encounter: Payer: Self-pay | Admitting: Internal Medicine

## 2011-12-15 NOTE — Progress Notes (Signed)
Quick Note:  My Chart note sent - negative results and no new recommendations No recall or letter needed ______

## 2011-12-16 ENCOUNTER — Telehealth: Payer: Self-pay | Admitting: *Deleted

## 2011-12-16 NOTE — Telephone Encounter (Signed)
  Follow up Call-  Call back number 12/11/2011 08/19/2011  Post procedure Call Back phone  # [phone]  Permission to leave phone message Yes Yes     LMOM

## 2012-01-04 ENCOUNTER — Other Ambulatory Visit: Payer: Self-pay | Admitting: Internal Medicine

## 2012-03-04 HISTORY — PX: PELVIC FRACTURE SURGERY: SHX119

## 2012-03-19 ENCOUNTER — Ambulatory Visit: Payer: 59 | Admitting: Internal Medicine

## 2012-03-25 ENCOUNTER — Encounter: Payer: Self-pay | Admitting: Internal Medicine

## 2012-03-25 ENCOUNTER — Ambulatory Visit (INDEPENDENT_AMBULATORY_CARE_PROVIDER_SITE_OTHER): Payer: Medicare Other | Admitting: Internal Medicine

## 2012-03-25 VITALS — BP 160/80 | HR 75 | Temp 98.0°F | Ht 66.0 in | Wt 140.0 lb

## 2012-03-25 DIAGNOSIS — K869 Disease of pancreas, unspecified: Secondary | ICD-10-CM | POA: Insufficient documentation

## 2012-03-25 DIAGNOSIS — I2510 Atherosclerotic heart disease of native coronary artery without angina pectoris: Secondary | ICD-10-CM | POA: Insufficient documentation

## 2012-03-25 DIAGNOSIS — M5030 Other cervical disc degeneration, unspecified cervical region: Secondary | ICD-10-CM | POA: Insufficient documentation

## 2012-03-25 DIAGNOSIS — K7689 Other specified diseases of liver: Secondary | ICD-10-CM

## 2012-03-25 DIAGNOSIS — R9389 Abnormal findings on diagnostic imaging of other specified body structures: Secondary | ICD-10-CM

## 2012-03-25 DIAGNOSIS — R609 Edema, unspecified: Secondary | ICD-10-CM | POA: Insufficient documentation

## 2012-03-25 DIAGNOSIS — M5136 Other intervertebral disc degeneration, lumbar region: Secondary | ICD-10-CM | POA: Insufficient documentation

## 2012-03-25 DIAGNOSIS — K769 Liver disease, unspecified: Secondary | ICD-10-CM | POA: Insufficient documentation

## 2012-03-25 DIAGNOSIS — R011 Cardiac murmur, unspecified: Secondary | ICD-10-CM

## 2012-03-25 DIAGNOSIS — M51369 Other intervertebral disc degeneration, lumbar region without mention of lumbar back pain or lower extremity pain: Secondary | ICD-10-CM | POA: Insufficient documentation

## 2012-03-25 MED ORDER — FLUDROCORTISONE ACETATE 0.1 MG PO TABS: 0.1000 mg | ORAL_TABLET | Freq: Two times a day (BID) | ORAL | Status: DC

## 2012-03-25 NOTE — Assessment & Plan Note (Signed)
Recently found, asymptomatic, will hold on ekg, has had mult recent, for echo as above, to cardiology if s/s worsen

## 2012-03-25 NOTE — Assessment & Plan Note (Signed)
For f/u as planned 

## 2012-03-25 NOTE — Assessment & Plan Note (Signed)
Suspect related to fludrocortisone, with orthostatis/syncope now resolved, to decrease the fludrocort to .1 bid and stop after 2 wks, with edema likely to resolve after, though will need also f/u on echo as above  Note:  Total time for pt hx, exam, review of record with pt in the room, determination of diagnoses and plan for further eval and tx is > 40 min, with over 50% spent in coordination and counseling of patient

## 2012-03-25 NOTE — Patient Instructions (Addendum)
OK to decrease the fludrocortisone to .1 mg twice per day for 2 wks, then stop The leg swelling should resolve, as well as the small hematoma near the left knee, and you should not have to consider compression stockings for the long term The blood pressure may take longer to decrease back to normal after stopping the fludrocortisone  You will be contacted regarding the referral for: echocardiogram for the heart murmur, as well pulmonary for the ? Of MAI lung infection Please keep your appointments with your specialists as you have planned - hepatobiliary oncology, trauma, and the PT Please return in 6 months, or sooner if needed  (OK to cancel the apr 2014 appointment)

## 2012-03-25 NOTE — Assessment & Plan Note (Signed)
suspcious for MAI, for pulm referral,  to f/u any worsening symptoms or concerns

## 2012-03-25 NOTE — Assessment & Plan Note (Signed)
For echo as rec'd,  to f/u any worsening symptoms or concerns
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
## 2012-04-02 ENCOUNTER — Ambulatory Visit (HOSPITAL_COMMUNITY): Payer: Medicare Other | Attending: Cardiology | Admitting: Radiology

## 2012-04-02 DIAGNOSIS — R609 Edema, unspecified: Secondary | ICD-10-CM

## 2012-04-02 DIAGNOSIS — I359 Nonrheumatic aortic valve disorder, unspecified: Secondary | ICD-10-CM | POA: Insufficient documentation

## 2012-04-02 DIAGNOSIS — I079 Rheumatic tricuspid valve disease, unspecified: Secondary | ICD-10-CM | POA: Insufficient documentation

## 2012-04-02 DIAGNOSIS — E785 Hyperlipidemia, unspecified: Secondary | ICD-10-CM | POA: Insufficient documentation

## 2012-04-02 DIAGNOSIS — R011 Cardiac murmur, unspecified: Secondary | ICD-10-CM | POA: Insufficient documentation

## 2012-04-02 NOTE — Progress Notes (Signed)
Echocardiogram performed.  

## 2012-04-05 ENCOUNTER — Encounter: Payer: Self-pay | Admitting: Internal Medicine

## 2012-04-06 ENCOUNTER — Telehealth: Payer: Self-pay | Admitting: Internal Medicine

## 2012-04-06 MED ORDER — TRAMADOL HCL 50 MG PO TABS: 50.0000 mg | ORAL_TABLET | Freq: Four times a day (QID) | ORAL | Status: DC | PRN

## 2012-04-06 NOTE — Telephone Encounter (Signed)
Called informed the patient of medication sent to pharmacy and MD instructions.
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
## 2012-04-06 NOTE — Telephone Encounter (Signed)
Ok for change to tramadol and depending on how she does, maybe an NSAID may be more approp for next med change

## 2012-04-09 ENCOUNTER — Encounter: Payer: Self-pay | Admitting: Internal Medicine

## 2012-04-09 ENCOUNTER — Ambulatory Visit (INDEPENDENT_AMBULATORY_CARE_PROVIDER_SITE_OTHER)
Admission: RE | Admit: 2012-04-09 | Discharge: 2012-04-09 | Disposition: A | Discharge status: Home / Self Care | Payer: Medicare Other | Source: Ambulatory Visit | Attending: Internal Medicine | Admitting: Internal Medicine

## 2012-04-09 ENCOUNTER — Ambulatory Visit (INDEPENDENT_AMBULATORY_CARE_PROVIDER_SITE_OTHER): Payer: Medicare Other | Admitting: Internal Medicine

## 2012-04-09 VITALS — BP 120/60 | HR 78 | Temp 98.1°F | Ht 66.0 in

## 2012-04-09 DIAGNOSIS — R9389 Abnormal findings on diagnostic imaging of other specified body structures: Secondary | ICD-10-CM

## 2012-04-09 DIAGNOSIS — R05 Cough: Secondary | ICD-10-CM

## 2012-04-09 DIAGNOSIS — R059 Cough, unspecified: Secondary | ICD-10-CM
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
## 2012-04-09 NOTE — Patient Instructions (Addendum)
GERD (REFLUX)  is an extremely common cause of respiratory symptoms (like chronic cough), many times with no significant heartburn at all.    It can be treated with medication, but also with lifestyle changes including avoidance of late meals, excessive alcohol, smoking cessation, and avoid fatty foods, chocolate, peppermint, colas, red wine, and acidic juices such as orange juice.  NO MINT OR MENTHOL PRODUCTS SO NO COUGH DROPS  USE SUGARLESS CANDY INSTEAD (jolley ranchers or Stover's)  NO OIL BASED VITAMINS - use powdered substitutes.  Please remember to go to  The x-ray department downstairs for your tests - we will call you with the results when they are available.  Please schedule a follow up visit in 3 months but call sooner if needed

## 2012-04-10 ENCOUNTER — Other Ambulatory Visit: Payer: Self-pay | Admitting: Internal Medicine

## 2012-04-10 NOTE — Assessment & Plan Note (Signed)
The most common causes of chronic cough in immunocompetent adults include the following: upper airway cough syndrome (UACS), previously referred to as postnasal drip syndrome (PNDS), which is caused by variety of rhinosinus conditions; (2) asthma; (3) GERD; (4) chronic bronchitis from cigarette smoking or other inhaled environmental irritants; (5) nonasthmatic eosinophilic bronchitis; and (6) bronchiectasis.   These conditions, singly or in combination, have accounted for up to 94% of the causes of chronic cough in prospective studies.   Other conditions have constituted no >6% of the causes in prospective studies These have included bronchogenic carcinoma, chronic interstitial pneumonia, sarcoidosis, left ventricular failure, ACEI-induced cough, and aspiration from a condition associated with pharyngeal dysfunction.    Chronic cough is often simultaneously caused by more than one condition. A single cause has been found from 38 to 82% of the time, multiple causes from 18 to 62%. Multiply caused cough has been the result of three diseases up to 42% of the time.       Most likely this is mild form of  Classic Upper airway cough syndrome, so named because it's frequently impossible to sort out how much is  CR/sinusitis with freq throat clearing (which can be related to primary GERD)   vs  causing  secondary (" extra esophageal")  GERD from wide swings in gastric pressure that occur with throat clearing, often  promoting self use of mint and menthol lozenges that reduce the lower esophageal sphincter tone and exacerbate the problem further in a cyclical fashion.   These are the same pts (now being labeled as having "irritable larynx syndrome" by some cough centers) who not infrequently have a history of having failed to tolerate ace inhibitors,  dry powder inhalers or biphosphonates or report having atypical reflux symptoms that don't respond to standard doses of PPI , and are easily confused as having  aecopd or asthma flares by even experienced allergists/ pulmonologists.  For now rx with diet only, strongly doubt x 10 y of cough the ct and the cough are related.

## 2012-04-10 NOTE — Progress Notes (Signed)
Quick Note:  Spoke with pt and notified of results per Dr. Wert. Pt verbalized understanding and denied any questions.  ______ 

## 2012-04-10 NOTE — Assessment & Plan Note (Signed)
Although there are clearly abnormalities on CT scan, they should probably be considered "microscopic" since not obvious on plain cxr .     In the setting of obvious "macroscopic" health issues,  I am very reluctatnt to embark on an invasive w/u at this point but will arrange consevative  follow up and in the meantime see what we can do to address the patient's subjective concerns.    Discussed in detail all the  indications, usual  risks and alternatives  relative to the benefits with patient who agrees to proceed with conservative f/u for now.  She could have MAI but present symptoms do not warrant fob/bx and committing her to longterm rx so serial cxr's and hx/exam is all that we agreed on for now

## 2012-04-19 ENCOUNTER — Encounter: Payer: Self-pay | Admitting: Internal Medicine

## 2012-04-20 NOTE — Telephone Encounter (Signed)
Dr. Sherene Sires, the pt received her CXR results via MyChart and was concerned because it mentions COPD and bronchial wall thickening. Does the pt need to come in for OV to discuss this? She saw you on 04/09/12 for abnormal CT scan and cough. Please advise. Carron Curie, CMA

## 2012-04-21 ENCOUNTER — Ambulatory Visit: Payer: 59 | Admitting: Internal Medicine

## 2012-06-30 ENCOUNTER — Encounter: Payer: Self-pay | Admitting: Internal Medicine

## 2012-06-30 ENCOUNTER — Ambulatory Visit (INDEPENDENT_AMBULATORY_CARE_PROVIDER_SITE_OTHER): Payer: Medicare Other | Admitting: Internal Medicine

## 2012-06-30 VITALS — BP 110/78 | HR 70 | Temp 97.3°F | Ht 65.5 in | Wt 137.6 lb

## 2012-06-30 DIAGNOSIS — R05 Cough: Secondary | ICD-10-CM

## 2012-06-30 DIAGNOSIS — J479 Bronchiectasis, uncomplicated: Secondary | ICD-10-CM | POA: Insufficient documentation
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
## 2012-06-30 NOTE — Assessment & Plan Note (Addendum)
Some of her symptoms are much more typical of cyclical/ upper airway cough unrelated to bronchiectasis and much better on gerd diet so add pepcid at bedtime to see what effect if any this has on her am cough.

## 2012-06-30 NOTE — Patient Instructions (Addendum)
Bronchiectasis =   you have scarring of your bronchial tubes which means that they don't function perfectly normally and mucus tends to pool in certain areas of your lung which can cause pneumonia and further scarring of your lung and bronchial tubes  Whenever you develop cough congestion take mucinex or mucinex dm > these will help keep the mucus loose and flowing but if your condition worsens you need to seek help immediately preferably here or somewhere inside the [facility] system to compare xrays ( worse = darker or bloody mucus or pain on breathing in)   Pepcid 20 mg one at bedtime continuously for at least a month to see what effect if any night time acid has on your cough.   Please schedule a follow up visit in 3 months but call sooner if needed cxr and pft's

## 2012-06-30 NOTE — Assessment & Plan Note (Signed)
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

## 2012-07-18 ENCOUNTER — Other Ambulatory Visit: Payer: Self-pay | Admitting: Internal Medicine

## 2012-09-29 ENCOUNTER — Other Ambulatory Visit (INDEPENDENT_AMBULATORY_CARE_PROVIDER_SITE_OTHER): Payer: Medicare Other

## 2012-09-29 ENCOUNTER — Ambulatory Visit (INDEPENDENT_AMBULATORY_CARE_PROVIDER_SITE_OTHER): Payer: Medicare Other | Admitting: Internal Medicine

## 2012-09-29 ENCOUNTER — Encounter: Payer: Self-pay | Admitting: Internal Medicine

## 2012-09-29 VITALS — BP 130/78 | HR 87 | Temp 97.7°F | Ht 66.0 in | Wt 135.0 lb

## 2012-09-29 DIAGNOSIS — E039 Hypothyroidism, unspecified: Secondary | ICD-10-CM

## 2012-09-29 DIAGNOSIS — Z0000 Encounter for general adult medical examination without abnormal findings: Secondary | ICD-10-CM

## 2012-09-29 DIAGNOSIS — E538 Deficiency of other specified B group vitamins: Secondary | ICD-10-CM

## 2012-09-29 DIAGNOSIS — Z23 Encounter for immunization: Secondary | ICD-10-CM

## 2012-09-29 DIAGNOSIS — D509 Iron deficiency anemia, unspecified: Secondary | ICD-10-CM

## 2012-09-29 DIAGNOSIS — E785 Hyperlipidemia, unspecified: Secondary | ICD-10-CM

## 2012-09-29 LAB — LIPID PANEL
HDL: 51.4 mg/dL (ref >39.00)
Triglycerides: 154 mg/dL — ABNORMAL HIGH (ref 0.0–149.0)
VLDL: 30.8 mg/dL (ref 0.0–40.0)

## 2012-09-29 LAB — BASIC METABOLIC PANEL
BUN: 15 mg/dL (ref 6–23)
Calcium: 9.8 mg/dL (ref 8.4–10.5)
GFR: 87.69 mL/min (ref >60.00)
Potassium: 4.9 mEq/L (ref 3.5–5.1)
Sodium: 139 mEq/L (ref 135–145)

## 2012-09-29 LAB — URINALYSIS, ROUTINE W REFLEX MICROSCOPIC
Bilirubin Urine: NEGATIVE
Ketones, ur: NEGATIVE
Leukocytes, UA: NEGATIVE
Specific Gravity, Urine: 1.015 (ref 1.000–1.030)
Total Protein, Urine: NEGATIVE
Urine Glucose: NEGATIVE
pH: 7 (ref 5.0–8.0)

## 2012-09-29 LAB — CBC WITH DIFFERENTIAL/PLATELET
Basophils Relative: 0.6 % (ref 0.0–3.0)
Eosinophils Relative: 3.4 % (ref 0.0–5.0)
HCT: 41.8 % (ref 36.0–46.0)
Hemoglobin: 14.2 g/dL (ref 12.0–15.0)
Lymphs Abs: 1.6 10*3/uL (ref 0.7–4.0)
MCV: 92.4 fl (ref 78.0–100.0)
Monocytes Absolute: 0.5 10*3/uL (ref 0.1–1.0)
Monocytes Relative: 9 % (ref 3.0–12.0)
Neutro Abs: 3.6 10*3/uL (ref 1.4–7.7)
Platelets: 373 10*3/uL (ref 150.0–400.0)
RBC: 4.52 Mil/uL (ref 3.87–5.11)
WBC: 6.1 10*3/uL (ref 4.5–10.5)

## 2012-09-29 LAB — HEPATIC FUNCTION PANEL
AST: 20 U/L (ref 0–37)
Albumin: 4.2 g/dL (ref 3.5–5.2)
Total Bilirubin: 0.7 mg/dL (ref 0.3–1.2)

## 2012-09-29 LAB — IBC PANEL
Saturation Ratios: 22.7 % (ref 20.0–50.0)
Transferrin: 214 mg/dL (ref 212.0–360.0)

## 2012-09-29 LAB — VITAMIN B12: Vitamin B-12: 420 pg/mL (ref 211–911)

## 2012-09-29 MED ORDER — CYANOCOBALAMIN 1000 MCG/ML IJ SOLN: INTRAMUSCULAR | Status: DC

## 2012-09-29 NOTE — Assessment & Plan Note (Signed)

## 2012-09-29 NOTE — Assessment & Plan Note (Signed)
For f/u lab 

## 2012-09-29 NOTE — Progress Notes (Signed)
Subjective:    Patient ID: Sheena Craig, female    DOB: 1940-05-27, 72 y.o.   MRN: [id number]  HPI  Here for wellness and f/u;  Overall doing ok;  Pt denies CP, worsening SOB, DOE, wheezing, orthopnea, PND, worsening LE edema, palpitations, dizziness or syncope.  Pt denies neurological change such as new headache, facial or extremity weakness.  Pt denies polydipsia, polyuria, or low sugar symptoms. Pt states overall good compliance with treatment and medications, good tolerability, and has been trying to follow lower cholesterol diet.  Pt denies worsening depressive symptoms, suicidal ideation or panic. No fever, night sweats, wt loss, loss of appetite, or other constitutional symptoms.  Pt states good ability with ADL's, has low fall risk, home safety reviewed and adequate, no other significant changes in hearing or vision, and only occasionally active with exercise.  No more alleve or advil since her gastritis episode last yr. Past Medical History  Diagnosis Date  . ANEMIA-NOS 10/17/2006  . DEPRESSION 10/17/2006  . FIBROMYALGIA 02/09/2007  . GERD 10/17/2006  . HYPERLIPIDEMIA 10/17/2006  . HYPOTHYROIDISM 10/17/2006  . LUMBAR RADICULOPATHY, LEFT 10/06/2009  . MIGRAINE HEADACHE 10/17/2006  . OSTEOARTHRITIS 10/17/2006  . OSTEOPENIA 10/17/2006  . OSTEOPOROSIS 02/09/2007  . Sarcoidosis 10/17/2006  . 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   Past Surgical History  Procedure Laterality Date  . Lymph node biopsy  1968  . Cataract extraction      bilateral  . Colonoscopy    . Upper gastrointestinal endoscopy      reports that she has never smoked. She has never used smokeless tobacco. She reports that she does not drink alcohol or use illicit drugs. family history includes Alcohol abuse in her father; Anxiety disorder in her father and mother; Breast cancer in her maternal aunt; Celiac disease in her other; Heart disease in her brother, father, and mother; Hypertension in her mother; Rheum  arthritis in her father and mother; Uterine cancer in her sister. There is no history of Colon cancer. Allergies  Allergen Reactions  . Allegra [Fexofenadine Hcl]   . Aspartame And Phenylalanine Other (See Comments)    GI upset,&pain  . Pravastatin Sodium     REACTION: GI upset  . Pseudoephedrine Other (See Comments)    "hyperactive"   Current Outpatient Prescriptions on File Prior to Visit  Medication Sig Dispense Refill  . acetaminophen (TYLENOL) 500 MG tablet Take 500 mg by mouth every 6 (six) hours as needed for pain.       . Calcium-Vitamin D-Vitamin K (CALCIUM SOFT CHEWS PO) Take 3 tablets by mouth daily.      . ferrous sulfate 325 (65 FE) MG tablet Take 325 mg by mouth daily.      Marland Kitchen FLUoxetine (PROZAC) 20 MG capsule Take 20 mg by mouth daily.      Marland Kitchen levothyroxine (SYNTHROID, LEVOTHROID) 125 MCG tablet Take 1 tablet (125 mcg total) by mouth daily.  90 tablet  3  . Nutritional Supplements (JUICE PLUS FIBRE PO) Take 6 capsules by mouth daily.       Lilian Kapur 3.75 G PACK TAKE 1 PACKET BY MOUTH ONCE DAILY  90 packet  3   No current facility-administered medications on file prior to visit.    Review of Systems Constitutional: Negative for diaphoresis, activity change, appetite change or unexpected weight change.  HENT: Negative for hearing loss, ear pain, facial swelling, mouth sores and neck stiffness.   Eyes: Negative for pain, redness and visual disturbance.  Respiratory: Negative for shortness of  breath and wheezing.   Cardiovascular: Negative for chest pain and palpitations.  Gastrointestinal: Negative for diarrhea, blood in stool, abdominal distention or other pain Genitourinary: Negative for hematuria, flank pain or change in urine volume.  Musculoskeletal: Negative for myalgias and joint swelling.  Skin: Negative for color change and wound.  Neurological: Negative for syncope and numbness. other than noted Hematological: Negative for adenopathy.  Psychiatric/Behavioral:  Negative for hallucinations, self-injury, decreased concentration and agitation.      Objective:   Physical Exam BP 130/78  Pulse 87  Temp(Src) 97.7 F (36.5 C) (Oral)  Ht 5\' 6"  (1.676 m)  Wt 135 lb (61.236 kg)  BMI 21.8 kg/m2  SpO2 96% VS noted,  Constitutional: Pt is oriented to person, place, and time. Appears well-developed and well-nourished.  Head: Normocephalic and atraumatic.  Right Ear: External ear normal.  Left Ear: External ear normal.  Nose: Nose normal.  Mouth/Throat: Oropharynx is clear and moist.  Eyes: Conjunctivae and EOM are normal. Pupils are equal, round, and reactive to light.  Neck: Normal range of motion. Neck supple. No JVD present. No tracheal deviation present.  Cardiovascular: Normal rate, regular rhythm, normal heart sounds and intact distal pulses.   Pulmonary/Chest: Effort normal and breath sounds normal.  Abdominal: Soft. Bowel sounds are normal. There is no tenderness. No HSM  Musculoskeletal: Normal range of motion. Exhibits no edema.  Lymphadenopathy:  Has no cervical adenopathy.  Neurological: Pt is alert and oriented to person, place, and time. Pt has normal reflexes. No cranial nerve deficit.  Skin: Skin is warm and dry. No rash noted.  Psychiatric:  Has  normal mood and affect. Behavior is normal.     Assessment & Plan:

## 2012-09-29 NOTE — Patient Instructions (Signed)
You had the Prevnar shot today Please remember to followup with your GYN for the yearly pap smear and/or mammogram Please continue your efforts at being more active, low cholesterol diet, and weight control. You are otherwise up to date with prevention measures today. Please go to the LAB in the Basement (turn left off the elevator) for the tests to be done today You will be contacted by phone if any changes need to be made immediately.  Otherwise, you will receive a letter about your results with an explanation, but please check with MyChart first.  Please remember to sign up for My Chart if you have not done so, as this will be important to you in the future with finding out test results, communicating by private email, and scheduling acute appointments online when needed.  Please return in 1 year for your yearly visit, or sooner if needed, with Lab testing done 3-5 days before

## 2012-10-01 ENCOUNTER — Encounter: Payer: Self-pay | Admitting: Internal Medicine

## 2012-10-01 ENCOUNTER — Ambulatory Visit (INDEPENDENT_AMBULATORY_CARE_PROVIDER_SITE_OTHER)
Admission: RE | Admit: 2012-10-01 | Discharge: 2012-10-01 | Disposition: A | Discharge status: Home / Self Care | Payer: Medicare Other | Source: Ambulatory Visit | Attending: Internal Medicine | Admitting: Internal Medicine

## 2012-10-01 ENCOUNTER — Ambulatory Visit (INDEPENDENT_AMBULATORY_CARE_PROVIDER_SITE_OTHER): Payer: Medicare Other | Admitting: Internal Medicine

## 2012-10-01 VITALS — BP 122/70 | HR 74 | Temp 97.0°F | Ht 65.25 in | Wt 136.0 lb

## 2012-10-01 DIAGNOSIS — R05 Cough: Secondary | ICD-10-CM

## 2012-10-01 DIAGNOSIS — J479 Bronchiectasis, uncomplicated: Secondary | ICD-10-CM

## 2012-10-01 LAB — PULMONARY FUNCTION TEST

## 2012-10-01 NOTE — Patient Instructions (Addendum)
You have scarring of your bronchial tubes which means that they don't function perfectly normally and mucus tends to pool in certain areas of your lung which can cause pneumonia and further scarring of your lung and bronchial tubes  Whenever you develop cough congestion take mucinex or mucinex dm 1200 mg every 12 hours > these will help keep the mucus loose and flowing but if your condition worsens you need to seek help immediately preferably here or somewhere inside the [facility] system to compare xrays ( worse = darker or bloody mucus or pain on breathing in)    Please remember to go to the  x-ray department downstairs for your tests - we will call you with the results when they are available.    Pulmonary follow up is as needed

## 2012-10-01 NOTE — Progress Notes (Signed)
PFT done today. 

## 2012-10-01 NOTE — Assessment & Plan Note (Signed)
Adequate control on present rx, reviewed > no change in rx needed   

## 2012-10-01 NOTE — Assessment & Plan Note (Addendum)
-   See CT abd 04/20/12 with rml and lingula involvement - PFTs wnl 10/01/2012   I had an extended discussion with the patient today lasting 15 to 20 minutes of a 25 minute visit on the following issues:   Discussed CT and pft's and the relative paucity of clinical issues at present > she is at risk of future flares and MAI but at present no indication at all to escalate pulmonary eval/ rx > we can see her here prn   See instructions for specific recommendations which were reviewed directly with the patient who was given a copy with highlighter outlining the key components.
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
## 2012-10-02 NOTE — Progress Notes (Signed)
Quick Note:  Spoke with pt and notified of results per Dr. Wert. Pt verbalized understanding and denied any questions.  ______ 

## 2012-10-17 ENCOUNTER — Other Ambulatory Visit: Payer: Self-pay | Admitting: Internal Medicine

## 2012-11-03 ENCOUNTER — Other Ambulatory Visit: Payer: Self-pay | Admitting: Internal Medicine

## 2012-11-03 DIAGNOSIS — J479 Bronchiectasis, uncomplicated: Secondary | ICD-10-CM

## 2012-11-13 ENCOUNTER — Encounter: Payer: Self-pay | Admitting: Internal Medicine

## 2012-12-03 ENCOUNTER — Other Ambulatory Visit: Payer: Self-pay | Admitting: Internal Medicine

## 2012-12-21 ENCOUNTER — Other Ambulatory Visit: Payer: Self-pay | Admitting: *Deleted

## 2012-12-21 MED ORDER — "SYRINGE/NEEDLE (DISP) 25G X 1"" 3 ML MISC": Status: DC

## 2013-02-07 ENCOUNTER — Other Ambulatory Visit: Payer: Self-pay | Admitting: Internal Medicine

## 2013-08-16 LAB — HM MAMMOGRAPHY

## 2013-08-17 ENCOUNTER — Other Ambulatory Visit: Payer: Self-pay

## 2013-08-17 MED ORDER — CYANOCOBALAMIN 1000 MCG/ML IJ SOLN: INTRAMUSCULAR | Status: DC

## 2013-10-01 ENCOUNTER — Other Ambulatory Visit (INDEPENDENT_AMBULATORY_CARE_PROVIDER_SITE_OTHER): Payer: Medicare Other

## 2013-10-01 ENCOUNTER — Encounter: Payer: Self-pay | Admitting: Internal Medicine

## 2013-10-01 ENCOUNTER — Ambulatory Visit (INDEPENDENT_AMBULATORY_CARE_PROVIDER_SITE_OTHER): Payer: Medicare Other | Admitting: Internal Medicine

## 2013-10-01 VITALS — BP 130/80 | HR 84 | Temp 97.9°F | Ht 66.0 in | Wt 140.0 lb

## 2013-10-01 DIAGNOSIS — E785 Hyperlipidemia, unspecified: Secondary | ICD-10-CM

## 2013-10-01 DIAGNOSIS — Z0000 Encounter for general adult medical examination without abnormal findings: Secondary | ICD-10-CM

## 2013-10-01 DIAGNOSIS — Z23 Encounter for immunization: Secondary | ICD-10-CM

## 2013-10-01 LAB — BASIC METABOLIC PANEL
BUN: 17 mg/dL (ref 6–23)
CALCIUM: 9.9 mg/dL (ref 8.4–10.5)
CO2: 29 mEq/L (ref 19–32)
CREATININE: 0.7 mg/dL (ref 0.4–1.2)
Chloride: 104 mEq/L (ref 96–112)
GFR: 93.59 mL/min (ref >60.00)
Glucose, Bld: 100 mg/dL — ABNORMAL HIGH (ref 70–99)
Potassium: 5.2 mEq/L — ABNORMAL HIGH (ref 3.5–5.1)
Sodium: 140 mEq/L (ref 135–145)

## 2013-10-01 LAB — CBC WITH DIFFERENTIAL/PLATELET
BASOS PCT: 0.4 % (ref 0.0–3.0)
Basophils Absolute: 0 10*3/uL (ref 0.0–0.1)
EOS ABS: 0.2 10*3/uL (ref 0.0–0.7)
EOS PCT: 2.8 % (ref 0.0–5.0)
HCT: 38.8 % (ref 36.0–46.0)
Hemoglobin: 13 g/dL (ref 12.0–15.0)
LYMPHS PCT: 24.5 % (ref 12.0–46.0)
Lymphs Abs: 1.8 10*3/uL (ref 0.7–4.0)
MCHC: 33.5 g/dL (ref 30.0–36.0)
MCV: 93.6 fl (ref 78.0–100.0)
MONOS PCT: 10.7 % (ref 3.0–12.0)
Monocytes Absolute: 0.8 10*3/uL (ref 0.1–1.0)
NEUTROS PCT: 61.6 % (ref 43.0–77.0)
Neutro Abs: 4.5 10*3/uL (ref 1.4–7.7)
Platelets: 445 10*3/uL — ABNORMAL HIGH (ref 150.0–400.0)
RBC: 4.14 Mil/uL (ref 3.87–5.11)
RDW: 13.9 % (ref 11.5–15.5)
WBC: 7.3 10*3/uL (ref 4.0–10.5)

## 2013-10-01 LAB — LIPID PANEL
Cholesterol: 248 mg/dL — ABNORMAL HIGH (ref 0–200)
HDL: 48.1 mg/dL (ref >39.00)
LDL Cholesterol: 162 mg/dL — ABNORMAL HIGH (ref 0–99)
NonHDL: 199.9
TRIGLYCERIDES: 189 mg/dL — AB (ref 0.0–149.0)
Total CHOL/HDL Ratio: 5
VLDL: 37.8 mg/dL (ref 0.0–40.0)

## 2013-10-01 LAB — URINALYSIS, ROUTINE W REFLEX MICROSCOPIC
Bilirubin Urine: NEGATIVE
Ketones, ur: NEGATIVE
Leukocytes, UA: NEGATIVE
NITRITE: NEGATIVE
PH: 7 (ref 5.0–8.0)
TOTAL PROTEIN, URINE-UPE24: NEGATIVE
Urine Glucose: NEGATIVE
Urobilinogen, UA: 0.2 (ref 0.0–1.0)
WBC UA: NONE SEEN (ref 0–?)

## 2013-10-01 LAB — HEPATIC FUNCTION PANEL
ALBUMIN: 4.3 g/dL (ref 3.5–5.2)
ALT: 22 U/L (ref 0–35)
AST: 22 U/L (ref 0–37)
Alkaline Phosphatase: 54 U/L (ref 39–117)
Bilirubin, Direct: 0 mg/dL (ref 0.0–0.3)
TOTAL PROTEIN: 7.3 g/dL (ref 6.0–8.3)
Total Bilirubin: 0.6 mg/dL (ref 0.2–1.2)

## 2013-10-01 LAB — TSH: TSH: 0.27 u[IU]/mL — ABNORMAL LOW (ref 0.35–4.50)

## 2013-10-01 NOTE — Patient Instructions (Addendum)

## 2013-10-01 NOTE — Progress Notes (Signed)
Pre visit review using our clinic review tool, if applicable. No additional management support is needed unless otherwise documented below in the visit note. 

## 2013-10-01 NOTE — Assessment & Plan Note (Signed)

## 2013-10-01 NOTE — Progress Notes (Signed)
Subjective:    Patient ID: Sheena Craig, female    DOB: November 10, 1940, 73 y.o.   MRN: [id number]  HPI  Here for wellness and f/u;  Overall doing ok;  Pt denies CP, worsening SOB, DOE, wheezing, orthopnea, PND, worsening LE edema, palpitations, dizziness or syncope.  Pt denies neurological change such as new headache, facial or extremity weakness.  Pt denies polydipsia, polyuria, or low sugar symptoms. Pt states overall good compliance with treatment and medications, good tolerability, and has been trying to follow lower cholesterol diet.  Pt denies worsening depressive symptoms, suicidal ideation or panic. No fever, night sweats, wt loss, loss of appetite, or other constitutional symptoms.  Pt states good ability with ADL's, has low fall risk, home safety reviewed and adequate, no other significant changes in hearing or vision, and only occasionally active with exercise. No current complaints.  Does excercises at home, but plans to do more cardio as well Past Medical History  Diagnosis Date  . ANEMIA-NOS 10/17/2006  . DEPRESSION 10/17/2006  . FIBROMYALGIA 02/09/2007  . GERD 10/17/2006  . HYPERLIPIDEMIA 10/17/2006  . HYPOTHYROIDISM 10/17/2006  . LUMBAR RADICULOPATHY, LEFT 10/06/2009  . MIGRAINE HEADACHE 10/17/2006  . OSTEOARTHRITIS 10/17/2006  . OSTEOPENIA 10/17/2006  . OSTEOPOROSIS 02/09/2007  . Sarcoidosis 10/17/2006  . 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   Past Surgical History  Procedure Laterality Date  . Lymph node biopsy  1968  . Cataract extraction      bilateral  . Colonoscopy    . Upper gastrointestinal endoscopy      reports that she has never smoked. She has never used smokeless tobacco. She reports that she does not drink alcohol or use illicit drugs. family history includes Alcohol abuse in her father; Anxiety disorder in her father and mother; Breast cancer in her maternal aunt; Celiac disease in her other; Heart disease in her brother, father, and mother; Hypertension  in her mother; Rheum arthritis in her father and mother; Uterine cancer in her sister. There is no history of Colon cancer. Allergies  Allergen Reactions  . Allegra [Fexofenadine Hcl]   . Aspartame And Phenylalanine Other (See Comments)    GI upset,&pain  . Pravastatin Sodium     REACTION: GI upset  . Pseudoephedrine Other (See Comments)    "hyperactive"   Current Outpatient Prescriptions on File Prior to Visit  Medication Sig Dispense Refill  . acetaminophen (TYLENOL) 500 MG tablet Take 500 mg by mouth every 6 (six) hours as needed for pain.       . Calcium-Vitamin D-Vitamin K (CALCIUM SOFT CHEWS PO) Take 3 tablets by mouth daily.      . cyanocobalamin (,VITAMIN B-12,) 1000 MCG/ML injection INJECT 1 ML INTRAMUSCULARLY MONTHLY AS DIRECTED  10 mL  11  . ferrous sulfate 325 (65 FE) MG tablet Take 325 mg by mouth daily.      Marland Kitchen FLUoxetine (PROZAC) 20 MG capsule Take 20 mg by mouth daily.      Marland Kitchen FLUoxetine (PROZAC) 20 MG capsule TAKE 1 CAPSULE BY MOUTH DAILY.  90 capsule  2  . levothyroxine (SYNTHROID, LEVOTHROID) 125 MCG tablet TAKE 1 TABLET (125 MCG TOTAL) BY MOUTH DAILY.  90 tablet  3  . Nutritional Supplements (JUICE PLUS FIBRE PO) Take 4 capsules by mouth daily.       . SYRINGE-NEEDLE, DISP, 3 ML (B-D 3CC LUER-LOK SYR 25GX1") 25G X 1" 3 ML MISC USE FOR B12 SHOTS ONCE A MONTH  100 each  1  . WELCHOL 3.75 G PACK  TAKE 1 PACKET BY MOUTH ONCE DAILY  90 packet  3   No current facility-administered medications on file prior to visit.  Has been statin intol and welchol not effective.  Review of Systems Constitutional: Negative for increased diaphoresis, other activity, appetite or other siginficant weight change  HENT: Negative for worsening hearing loss, ear pain, facial swelling, mouth sores and neck stiffness.   Eyes: Negative for other worsening pain, redness or visual disturbance.  Respiratory: Negative for shortness of breath and wheezing.   Cardiovascular: Negative for chest pain and  palpitations.  Gastrointestinal: Negative for diarrhea, blood in stool, abdominal distention or other pain Genitourinary: Negative for hematuria, flank pain or change in urine volume.  Musculoskeletal: Negative for myalgias or other joint complaints.  Skin: Negative for color change and wound.  Neurological: Negative for syncope and numbness. other than noted Hematological: Negative for adenopathy. or other swelling Psychiatric/Behavioral: Negative for hallucinations, self-injury, decreased concentration or other worsening agitation.      Objective:   Physical Exam BP 130/80  Pulse 84  Temp(Src) 97.9 F (36.6 C) (Oral)  Ht 5\' 6"  (1.676 m)  Wt 140 lb (63.504 kg)  BMI 22.61 kg/m2  SpO2 96% VS noted,  Constitutional: Pt is oriented to person, place, and time. Appears well-developed and well-nourished.  Head: Normocephalic and atraumatic.  Right Ear: External ear normal.  Left Ear: External ear normal.  Nose: Nose normal.  Mouth/Throat: Oropharynx is clear and moist.  Eyes: Conjunctivae and EOM are normal. Pupils are equal, round, and reactive to light.  Neck: Normal range of motion. Neck supple. No JVD present. No tracheal deviation present.  Cardiovascular: Normal rate, regular rhythm, normal heart sounds and intact distal pulses.   Pulmonary/Chest: Effort normal and breath sounds without rales or wheezing  Abdominal: Soft. Bowel sounds are normal. NT. No HSM  Musculoskeletal: Normal range of motion. Exhibits no edema.  Lymphadenopathy:  Has no cervical adenopathy.  Neurological: Pt is alert and oriented to person, place, and time. Pt has normal reflexes. No cranial nerve deficit. Motor grossly intact Skin: Skin is warm and dry. No rash noted.  Psychiatric:  Has normal mood and affect. Behavior is normal.     Assessment & Plan:

## 2013-10-19 ENCOUNTER — Other Ambulatory Visit: Payer: Self-pay

## 2013-10-19 MED ORDER — LEVOTHYROXINE SODIUM 125 MCG PO TABS: 125.0000 ug | ORAL_TABLET | Freq: Every day | ORAL | Status: DC

## 2013-10-27 IMAGING — MG MM DIGITAL SCREENING BILAT
4 series · 4 of 4 positions shown · non-contrast
Comparison: Previous exams.

CLINICAL DATA: Screening.

DIGITAL BILATERAL SCREENING MAMMOGRAM WITH CAD

[R CC]
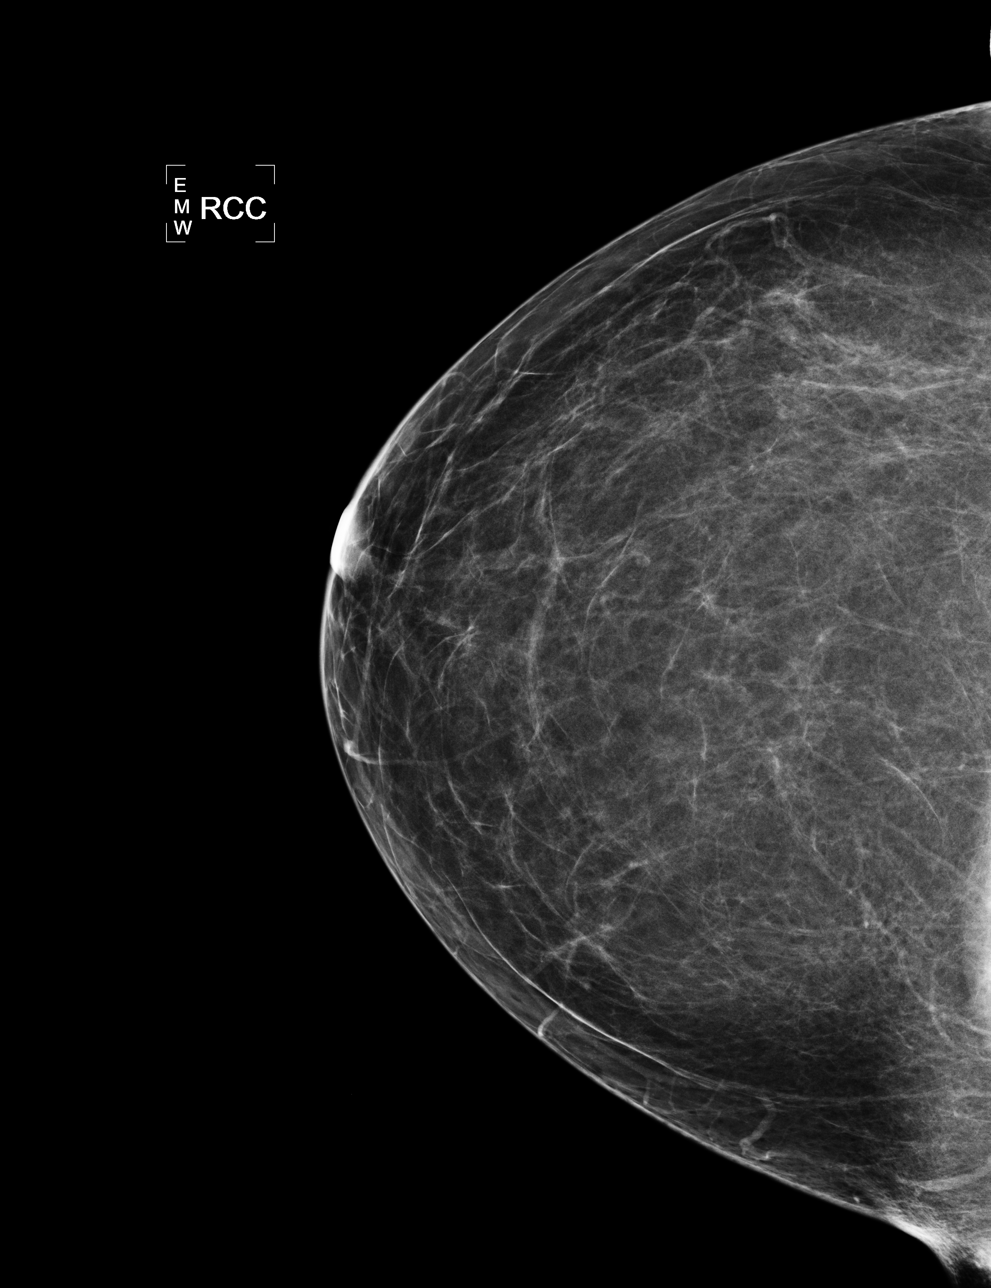

[L CC]
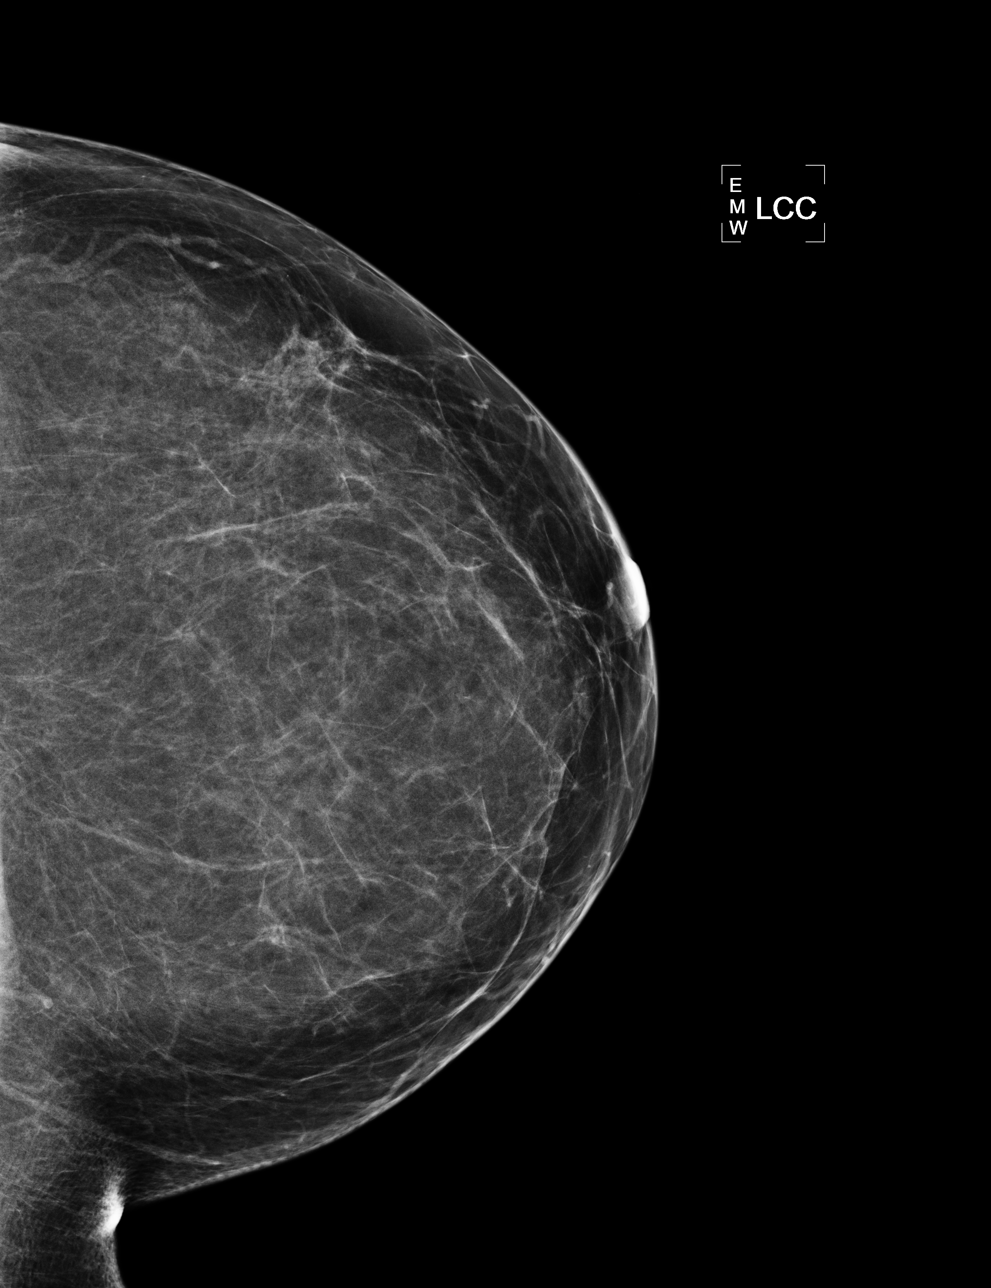

[L MLO]
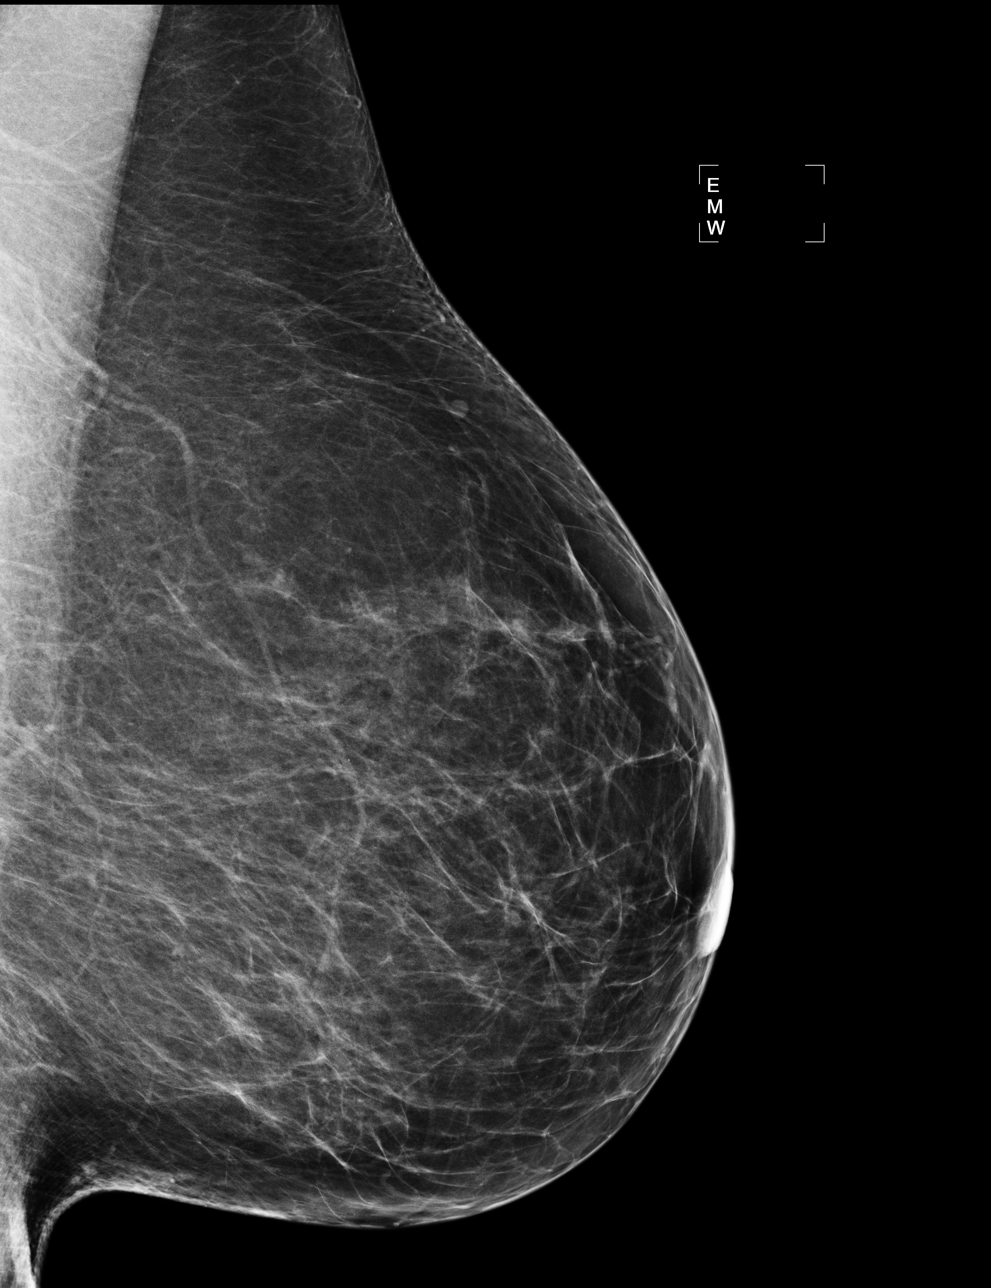

[R MLO]
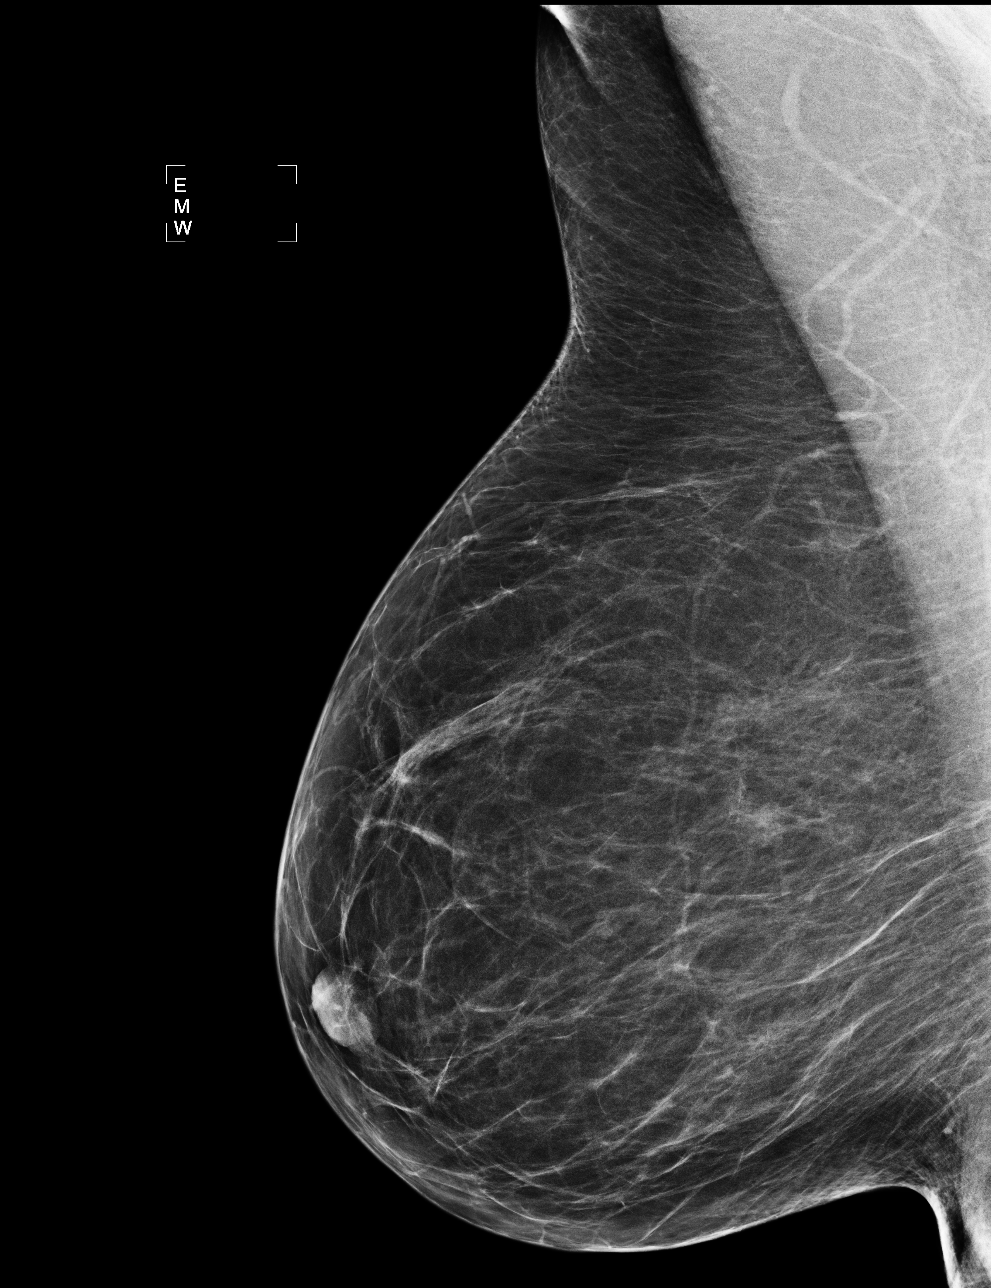

[4 of 4 positions shown; findings below may reference images not displayed]

FINDINGS: There are scattered fibroglandular densities. No
suspicious masses, architectural distortion, or calcifications are
present.

Images were processed with CAD.
IMPRESSION: No mammographic evidence of malignancy.

A result letter of this screening mammogram will be mailed directly
to the patient.

RECOMMENDATION:
Screening mammogram in one year. (Code:FV-O-IDP)

BI-RADS CATEGORY 1:  Negative.

## 2013-11-04 ENCOUNTER — Other Ambulatory Visit: Payer: Self-pay

## 2013-11-04 MED ORDER — FLUOXETINE HCL 20 MG PO CAPS: 20.0000 mg | ORAL_CAPSULE | Freq: Every day | ORAL | Status: DC

## 2013-12-17 ENCOUNTER — Ambulatory Visit (INDEPENDENT_AMBULATORY_CARE_PROVIDER_SITE_OTHER)
Admission: RE | Admit: 2013-12-17 | Discharge: 2013-12-17 | Disposition: A | Discharge status: Home / Self Care | Payer: Medicare Other | Source: Ambulatory Visit | Attending: Internal Medicine | Admitting: Internal Medicine

## 2013-12-17 ENCOUNTER — Ambulatory Visit (INDEPENDENT_AMBULATORY_CARE_PROVIDER_SITE_OTHER): Payer: Medicare Other | Admitting: Internal Medicine

## 2013-12-17 ENCOUNTER — Encounter: Payer: Self-pay | Admitting: Internal Medicine

## 2013-12-17 DIAGNOSIS — J479 Bronchiectasis, uncomplicated: Secondary | ICD-10-CM
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
## 2013-12-17 NOTE — Patient Instructions (Addendum)
Try prilosec 20mg   Take 30-60 min before first meal of the day and Pepcid 20 mg one bedtime until cough is completely gone for at least a week without the need for cough suppression  GERD (REFLUX)  is an extremely common cause of respiratory symptoms just like yours , many times with no obvious heartburn at all.    It can be treated with medication, but also with lifestyle changes including avoidance of late meals, excessive alcohol, smoking cessation, and avoid fatty foods, chocolate, peppermint, colas, red wine, and acidic juices such as orange juice.  NO MINT OR MENTHOL PRODUCTS SO NO COUGH DROPS  USE SUGARLESS CANDY INSTEAD (Jolley ranchers or Stover's or Life Savers) or even ice chips will also do - the key is to swallow to prevent all throat clearing. NO OIL BASED VITAMINS - use powdered substitutes.    Please remember to go to the  x-ray department downstairs for your tests - we will call you with the results when they are available.  Please schedule a follow up visit in 6 months but call sooner if needed

## 2013-12-18 ENCOUNTER — Encounter: Payer: Self-pay | Admitting: Internal Medicine

## 2013-12-18 NOTE — Assessment & Plan Note (Signed)
-   See CT abd 04/20/12 with rml and lingula involvement - PFTs wnl 10/01/2012   I had an extended discussion with the patient reviewing all relevant studies completed to date and  lasting 15 to 20 minutes of a 25 minute visit on the following ongoing concerns:   No convincing evidence of significant flare of bronchiectasis or progression radiographically so no no need to change rx   See instructions for specific recommendations which were reviewed directly with the patient who was given a copy with highlighter outlining the key components.

## 2013-12-20 NOTE — Progress Notes (Signed)
Quick Note:  Spoke with pt and notified of results per Dr. Wert. Pt verbalized understanding and denied any questions.  ______ 

## 2013-12-20 NOTE — Telephone Encounter (Signed)
Please advise Dr Melvyn Novas. Thanks.

## 2013-12-26 ENCOUNTER — Encounter: Payer: Self-pay | Admitting: Internal Medicine

## 2013-12-27 NOTE — Telephone Encounter (Signed)
Dr Melvyn Novas please read the e-mail and advise recs thanks

## 2013-12-30 ENCOUNTER — Encounter: Payer: Self-pay | Admitting: Adult Health

## 2013-12-30 ENCOUNTER — Ambulatory Visit (INDEPENDENT_AMBULATORY_CARE_PROVIDER_SITE_OTHER): Payer: Medicare Other | Admitting: Adult Health

## 2013-12-30 VITALS — BP 120/78 | HR 97 | Temp 98.2°F | Ht 66.0 in | Wt 140.0 lb

## 2013-12-30 DIAGNOSIS — J470 Bronchiectasis with acute lower respiratory infection: Secondary | ICD-10-CM

## 2013-12-30 MED ORDER — PREDNISONE 10 MG PO TABS: ORAL_TABLET | ORAL | Status: DC

## 2013-12-30 MED ORDER — HYDROCODONE-HOMATROPINE 5-1.5 MG/5ML PO SYRP: 5.0000 mL | ORAL_SOLUTION | Freq: Every evening | ORAL | Status: DC | PRN
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
## 2013-12-30 NOTE — Patient Instructions (Addendum)
Prednisone taper over next week, take w/ food.  Begin Zyrtec 10mg  daily for 1 week then As needed  For drainage  Begin Chlortrimeton 4mg  2 At bedtime for drainage  Continue on prilosec and pepcid for reflux for 6 weeks  Hydromet 1 tsp At bedtime  .As needed  Cough, may make you sleepy.  Follow up Dr. Melvyn Novas  In 6 weeks and As needed   Please contact office for sooner follow up if symptoms do not improve or worsen or seek emergency care

## 2013-12-30 NOTE — Assessment & Plan Note (Addendum)
Resolving flare ? Post viral cough  CXR  w/ nad No sign of active infection   Plan  Prednisone taper over next week, take w/ food.  Begin Zyrtec 10mg  daily for 1 week then As needed  For drainage  Begin Chlortrimeton 4mg  2 At bedtime for drainage  Continue on prilosec and pepcid for reflux for 6 weeks  Hydromet 1 tsp At bedtime  .As needed  Cough, may make you sleepy.  Follow up Dr. Melvyn Novas  In 6 weeks and As needed   Please contact office for sooner follow up if symptoms do not improve or worsen or seek emergency care

## 2014-02-02 ENCOUNTER — Other Ambulatory Visit: Payer: Self-pay

## 2014-02-02 MED ORDER — FLUOXETINE HCL 20 MG PO CAPS
20.0000 mg | ORAL_CAPSULE | Freq: Every day | ORAL | Status: DC
Start: 2014-02-02 — End: 2014-11-11

## 2014-02-02 MED ORDER — LEVOTHYROXINE SODIUM 125 MCG PO TABS: 125.0000 ug | ORAL_TABLET | Freq: Every day | ORAL | Status: DC

## 2014-02-02 NOTE — Addendum Note (Signed)
Addended by: Sharon Seller B on: 02/02/2014 08:03 AM   Modules accepted: Orders

## 2014-06-17 ENCOUNTER — Ambulatory Visit (INDEPENDENT_AMBULATORY_CARE_PROVIDER_SITE_OTHER): Payer: Commercial Managed Care - HMO | Admitting: Internal Medicine

## 2014-06-17 ENCOUNTER — Encounter: Payer: Self-pay | Admitting: Internal Medicine

## 2014-06-17 VITALS — BP 124/70 | HR 74 | Ht 66.0 in | Wt 137.8 lb

## 2014-06-17 DIAGNOSIS — M67441 Ganglion, right hand: Secondary | ICD-10-CM | POA: Diagnosis not present

## 2014-06-17 DIAGNOSIS — R05 Cough: Secondary | ICD-10-CM | POA: Diagnosis not present

## 2014-06-17 DIAGNOSIS — J479 Bronchiectasis, uncomplicated: Secondary | ICD-10-CM

## 2014-06-17 DIAGNOSIS — R059 Cough, unspecified: Secondary | ICD-10-CM

## 2014-06-17 NOTE — Patient Instructions (Addendum)
For drainage take chlortrimeton (chlorpheniramine) 4 mg every 4 hours available over the counter (may cause drowsiness)   You have a ganglion cyst on your R middle finger and you would need a hand surgeon to remove it but may need Dr Jenny Reichmann to refer you    If you are satisfied with your treatment plan,  let your doctor know and he/she can either refill your medications or you can return here when your prescription runs out.     If in any way you are not 100% satisfied,  please tell us.  If 100% better, tell your friends!  Pulmonary follow up is as needed
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
## 2014-06-18 ENCOUNTER — Encounter: Payer: Self-pay | Admitting: Internal Medicine

## 2014-06-18 DIAGNOSIS — M67441 Ganglion, right hand: Secondary | ICD-10-CM | POA: Insufficient documentation

## 2014-06-18 NOTE — Assessment & Plan Note (Signed)
-   See CT abd 04/20/12 with rml and lingula involvement - PFTs wnl 10/01/2012   Adequate control on present rx, reviewed > no change in rx needed    Each maintenance medication was reviewed in detail including most importantly the difference between maintenance and as needed and under what circumstances the prns are to be used.  Please see instructions for details which were reviewed in writing and the patient given a copy.

## 2014-06-18 NOTE — Assessment & Plan Note (Signed)
First noted 06/17/14 ov > rec refer to ortho/ hand specialist per primary per insurance regs

## 2014-06-18 NOTE — Assessment & Plan Note (Addendum)
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   These are the same pts (now being labeled as having "irritable larynx syndrome" by some cough centers) who not infrequently have a history of having failed to tolerate ace inhibitors,  dry powder inhalers or biphosphonates or report having atypical reflux symptoms that don't respond to standard doses of PPI , and are easily confused as having aecopd or asthma flares by even experienced allergists/ pulmonologists.  rec try add 1st gen H1 prn to max gerd rx  I had an extended discussion with the patient reviewing all relevant studies completed to date and  lasting 15 to 20 minutes of a 25 minute visit on the following ongoing concerns:  Each maintenance medication was reviewed in detail including most importantly the difference between maintenance and as needed and under what circumstances the prns are to be used.  Please see instructions for details which were reviewed in writing and the patient given a copy.

## 2014-06-24 DIAGNOSIS — Z1231 Encounter for screening mammogram for malignant neoplasm of breast: Secondary | ICD-10-CM | POA: Diagnosis not present

## 2014-06-24 DIAGNOSIS — Z6822 Body mass index (BMI) 22.0-22.9, adult: Secondary | ICD-10-CM | POA: Diagnosis not present

## 2014-06-24 DIAGNOSIS — Z01419 Encounter for gynecological examination (general) (routine) without abnormal findings: Secondary | ICD-10-CM | POA: Diagnosis not present

## 2014-09-02 DIAGNOSIS — Z961 Presence of intraocular lens: Secondary | ICD-10-CM | POA: Diagnosis not present

## 2014-09-02 DIAGNOSIS — Z0100 Encounter for examination of eyes and vision without abnormal findings: Secondary | ICD-10-CM | POA: Diagnosis not present

## 2014-10-03 ENCOUNTER — Other Ambulatory Visit: Payer: Self-pay | Admitting: Internal Medicine

## 2014-10-07 ENCOUNTER — Encounter: Payer: Commercial Managed Care - HMO | Admitting: Internal Medicine

## 2014-10-24 ENCOUNTER — Telehealth: Payer: Self-pay

## 2014-10-24 NOTE — Telephone Encounter (Signed)
Call to educate on AWV and schedule apt prior to CPE on 10/28 with Dr. Jenny Reichmann at 2:30; LVM for call back

## 2014-10-26 NOTE — Telephone Encounter (Signed)
LMV on mobile phone to call the practice regarding scheduling AWV; Will hold apt prior to CPE on 10/28 at 2:30 with Dr. Jenny Reichmann; Call to see if she can schedule independently or come in approx 1:45 the day of her visit with Dr Jenny Reichmann

## 2014-10-27 NOTE — Telephone Encounter (Signed)
Called back the patient and she was with a child but stated that she really does not feel she needs AWV and that she is up to date on screens and will fup with Dr. Jenny Reichmann. Thanked her for her time.

## 2014-11-11 ENCOUNTER — Ambulatory Visit (INDEPENDENT_AMBULATORY_CARE_PROVIDER_SITE_OTHER): Payer: Commercial Managed Care - HMO | Admitting: Internal Medicine

## 2014-11-11 ENCOUNTER — Encounter: Payer: Self-pay | Admitting: Internal Medicine

## 2014-11-11 ENCOUNTER — Other Ambulatory Visit (INDEPENDENT_AMBULATORY_CARE_PROVIDER_SITE_OTHER): Payer: Commercial Managed Care - HMO

## 2014-11-11 VITALS — BP 120/76 | HR 69 | Temp 97.6°F | Ht 66.0 in | Wt 138.0 lb

## 2014-11-11 DIAGNOSIS — M8580 Other specified disorders of bone density and structure, unspecified site: Secondary | ICD-10-CM

## 2014-11-11 DIAGNOSIS — R42 Dizziness and giddiness: Secondary | ICD-10-CM | POA: Diagnosis not present

## 2014-11-11 DIAGNOSIS — R7989 Other specified abnormal findings of blood chemistry: Secondary | ICD-10-CM

## 2014-11-11 DIAGNOSIS — Z23 Encounter for immunization: Secondary | ICD-10-CM

## 2014-11-11 DIAGNOSIS — F329 Major depressive disorder, single episode, unspecified: Secondary | ICD-10-CM | POA: Diagnosis not present

## 2014-11-11 DIAGNOSIS — Z0000 Encounter for general adult medical examination without abnormal findings: Secondary | ICD-10-CM

## 2014-11-11 DIAGNOSIS — E039 Hypothyroidism, unspecified: Secondary | ICD-10-CM

## 2014-11-11 DIAGNOSIS — F32A Depression, unspecified: Secondary | ICD-10-CM

## 2014-11-11 LAB — HEPATIC FUNCTION PANEL
ALT: 14 U/L (ref 0–35)
AST: 16 U/L (ref 0–37)
Albumin: 4 g/dL (ref 3.5–5.2)
Alkaline Phosphatase: 59 U/L (ref 39–117)
BILIRUBIN DIRECT: 0 mg/dL (ref 0.0–0.3)
Total Bilirubin: 0.3 mg/dL (ref 0.2–1.2)
Total Protein: 7.1 g/dL (ref 6.0–8.3)

## 2014-11-11 LAB — CBC WITH DIFFERENTIAL/PLATELET
Basophils Absolute: 0 10*3/uL (ref 0.0–0.1)
Basophils Relative: 0.5 % (ref 0.0–3.0)
Eosinophils Absolute: 0.3 10*3/uL (ref 0.0–0.7)
Eosinophils Relative: 3.6 % (ref 0.0–5.0)
HCT: 40 % (ref 36.0–46.0)
Hemoglobin: 13.4 g/dL (ref 12.0–15.0)
Lymphocytes Relative: 28.5 % (ref 12.0–46.0)
Lymphs Abs: 2.1 10*3/uL (ref 0.7–4.0)
MCHC: 33.4 g/dL (ref 30.0–36.0)
MCV: 90.1 fl (ref 78.0–100.0)
Monocytes Absolute: 0.7 10*3/uL (ref 0.1–1.0)
Monocytes Relative: 9.7 % (ref 3.0–12.0)
Neutro Abs: 4.2 10*3/uL (ref 1.4–7.7)
Neutrophils Relative %: 57.7 % (ref 43.0–77.0)
Platelets: 408 10*3/uL — ABNORMAL HIGH (ref 150.0–400.0)
RBC: 4.43 Mil/uL (ref 3.87–5.11)
RDW: 12.9 % (ref 11.5–15.5)
WBC: 7.2 10*3/uL (ref 4.0–10.5)

## 2014-11-11 LAB — URINALYSIS, ROUTINE W REFLEX MICROSCOPIC
Bilirubin Urine: NEGATIVE
Ketones, ur: NEGATIVE
Leukocytes, UA: NEGATIVE
Nitrite: NEGATIVE
Specific Gravity, Urine: <=1.005 — AB
Total Protein, Urine: NEGATIVE
Urine Glucose: NEGATIVE
Urobilinogen, UA: 0.2
pH: 6 (ref 5.0–8.0)

## 2014-11-11 LAB — BASIC METABOLIC PANEL
BUN: 17 mg/dL (ref 6–23)
CHLORIDE: 104 meq/L (ref 96–112)
CO2: 28 mEq/L (ref 19–32)
CREATININE: 0.74 mg/dL (ref 0.40–1.20)
Calcium: 9.7 mg/dL (ref 8.4–10.5)
GFR: 81.76 mL/min (ref >60.00)
Glucose, Bld: 90 mg/dL (ref 70–99)
POTASSIUM: 4.5 meq/L (ref 3.5–5.1)
Sodium: 140 mEq/L (ref 135–145)

## 2014-11-11 LAB — LIPID PANEL
CHOL/HDL RATIO: 5
Cholesterol: 235 mg/dL — ABNORMAL HIGH (ref 0–200)
HDL: 47.6 mg/dL (ref >39.00)
NONHDL: 187.37
TRIGLYCERIDES: 292 mg/dL — AB (ref 0.0–149.0)
VLDL: 58.4 mg/dL — ABNORMAL HIGH (ref 0.0–40.0)

## 2014-11-11 LAB — TSH: TSH: 0.07 u[IU]/mL — ABNORMAL LOW (ref 0.35–4.50)

## 2014-11-11 LAB — LDL CHOLESTEROL, DIRECT: Direct LDL: 127 mg/dL

## 2014-11-11 MED ORDER — FLUOXETINE HCL 10 MG PO CAPS: 10.0000 mg | ORAL_CAPSULE | Freq: Every day | ORAL | Status: DC
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
## 2014-11-11 NOTE — Patient Instructions (Addendum)
You had the flu shot today  Please schedule the bone density test before leaving today at the scheduling desk (where you check out)  Your blood pressure was OK with standing today  OK to wean the prozac off by taking 10 mg per day for 2 months, then 10 mg every other day for 1 mo, then stop  Please continue all other medications as before, and refills have been done if requested.  Please have the pharmacy call with any other refills you may need.  Please continue your efforts at being more active, low cholesterol diet, and weight control.  You are otherwise up to date with prevention measures today.  Please keep your appointments with your specialists as you may have planned  Please go to the LAB in the Basement (turn left off the elevator) for the tests to be done today  You will be contacted by phone if any changes need to be made immediately.  Otherwise, you will receive a letter about your results with an explanation, but please check with MyChart first.  Please remember to sign up for MyChart if you have not done so, as this will be important to you in the future with finding out test results, communicating by private email, and scheduling acute appointments online when needed.  Please return in 1 year for your yearly visit, or sooner if needed, with Lab testing done 3-5 days before

## 2014-11-11 NOTE — Progress Notes (Signed)
Pre visit review using our clinic review tool, if applicable. No additional management support is needed unless otherwise documented below in the visit note. 

## 2014-11-12 ENCOUNTER — Other Ambulatory Visit: Payer: Self-pay | Admitting: Internal Medicine

## 2014-11-12 MED ORDER — LEVOTHYROXINE SODIUM 75 MCG PO TABS: 75.0000 ug | ORAL_TABLET | Freq: Every day | ORAL | Status: DC

## 2014-11-12 NOTE — Assessment & Plan Note (Signed)
Not orthostatic today, cont same tx, pt reassured,  to f/u any worsening symptoms or concerns

## 2014-11-12 NOTE — Assessment & Plan Note (Signed)
Ok to try wean off prozac x 3 mo,  to f/u any worsening symptoms or concerns

## 2014-11-12 NOTE — Assessment & Plan Note (Signed)

## 2014-11-18 ENCOUNTER — Ambulatory Visit (INDEPENDENT_AMBULATORY_CARE_PROVIDER_SITE_OTHER)
Admission: RE | Admit: 2014-11-18 | Discharge: 2014-11-18 | Disposition: A | Discharge status: Home / Self Care | Payer: Commercial Managed Care - HMO | Source: Ambulatory Visit | Attending: Internal Medicine | Admitting: Internal Medicine

## 2014-11-18 DIAGNOSIS — M8580 Other specified disorders of bone density and structure, unspecified site: Secondary | ICD-10-CM

## 2014-11-18 HISTORY — DX: Other specified disorders of bone density and structure, unspecified site: M85.80

## 2014-11-23 DIAGNOSIS — M8580 Other specified disorders of bone density and structure, unspecified site: Secondary | ICD-10-CM

## 2014-11-23 HISTORY — DX: Other specified disorders of bone density and structure, unspecified site: M85.80

## 2014-12-10 ENCOUNTER — Encounter: Payer: Self-pay | Admitting: Internal Medicine

## 2014-12-13 MED ORDER — ALENDRONATE SODIUM 70 MG PO TABS: 70.0000 mg | ORAL_TABLET | ORAL | Status: DC

## 2014-12-13 NOTE — Addendum Note (Signed)
Addended by: Biagio Borg on: 12/13/2014 12:42 PM   Modules accepted: Orders

## 2014-12-16 ENCOUNTER — Other Ambulatory Visit (INDEPENDENT_AMBULATORY_CARE_PROVIDER_SITE_OTHER): Payer: Commercial Managed Care - HMO

## 2014-12-16 DIAGNOSIS — E039 Hypothyroidism, unspecified: Secondary | ICD-10-CM | POA: Diagnosis not present

## 2014-12-16 LAB — TSH: TSH: 7.39 u[IU]/mL — AB (ref 0.35–4.50)

## 2014-12-16 LAB — T4, FREE: Free T4: 0.77 ng/dL (ref 0.60–1.60)

## 2014-12-17 ENCOUNTER — Encounter: Payer: Self-pay | Admitting: Internal Medicine

## 2014-12-23 ENCOUNTER — Other Ambulatory Visit: Payer: Self-pay | Admitting: Internal Medicine

## 2014-12-23 MED ORDER — LEVOTHYROXINE SODIUM 88 MCG PO TABS: 88.0000 ug | ORAL_TABLET | Freq: Every day | ORAL | Status: DC

## 2015-01-03 ENCOUNTER — Encounter: Payer: Self-pay | Admitting: Internal Medicine

## 2015-01-03 DIAGNOSIS — M79643 Pain in unspecified hand: Secondary | ICD-10-CM

## 2015-01-07 ENCOUNTER — Encounter: Payer: Self-pay | Admitting: Family Medicine

## 2015-01-07 ENCOUNTER — Ambulatory Visit (INDEPENDENT_AMBULATORY_CARE_PROVIDER_SITE_OTHER): Payer: Commercial Managed Care - HMO | Admitting: Family Medicine

## 2015-01-07 VITALS — BP 122/78 | HR 70 | Temp 97.9°F | Ht 66.0 in | Wt 138.0 lb

## 2015-01-07 DIAGNOSIS — J0110 Acute frontal sinusitis, unspecified: Secondary | ICD-10-CM | POA: Diagnosis not present

## 2015-01-07 MED ORDER — AMOXICILLIN-POT CLAVULANATE 875-125 MG PO TABS
1.0000 | ORAL_TABLET | Freq: Two times a day (BID) | ORAL | Status: DC
Start: 2015-01-07 — End: 2015-07-06

## 2015-01-07 MED ORDER — FLUTICASONE PROPIONATE 50 MCG/ACT NA SUSP: 2.0000 | Freq: Every day | NASAL | Status: DC

## 2015-01-07 NOTE — Progress Notes (Signed)
Pre visit review using our clinic review tool, if applicable. No additional management support is needed unless otherwise documented below in the visit note. 

## 2015-01-07 NOTE — Progress Notes (Signed)
Subjective:     Sheena Craig is a 74 y.o. female who presents for evaluation of sinus pain. Symptoms include: congestion, cough, facial pain, fevers, nasal congestion, post nasal drip and sinus pressure. Onset of symptoms was 1 week ago. Symptoms have been gradually worsening since that time. Past history is significant for no history of pneumonia or bronchitis. Patient is a non-smoker.  The following portions of the patient's history were reviewed and updated as appropriate:  She  has a past medical history of ANEMIA-NOS (10/17/2006); DEPRESSION (10/17/2006); FIBROMYALGIA (02/09/2007); GERD (10/17/2006); HYPERLIPIDEMIA (10/17/2006); HYPOTHYROIDISM (10/17/2006); LUMBAR RADICULOPATHY, LEFT (10/06/2009); MIGRAINE HEADACHE (10/17/2006); OSTEOARTHRITIS (10/17/2006); OSTEOPENIA (10/17/2006); OSTEOPOROSIS (02/09/2007); Sarcoidosis ([place]) (10/17/2006); Pernicious anemia-B12 deficiency (12/11/2011); and Osteopenia (11/23/2014). She  does not have any pertinent problems on file. She  has past surgical history that includes Lymph node biopsy (1968); Cataract extraction; Colonoscopy; and Upper gastrointestinal endoscopy. Her family history includes Alcohol abuse in her father; Anxiety disorder in her father and mother; Breast cancer in her maternal aunt; Celiac disease in her other; Heart disease in her brother, father, and mother; Hypertension in her mother; Rheum arthritis in her father and mother; Uterine cancer in her sister. There is no history of Colon cancer. She  reports that she has never smoked. She has never used smokeless tobacco. She reports that she does not drink alcohol or use illicit drugs. She has a current medication list which includes the following prescription(s): acetaminophen, alendronate, calcium-vitamin d-vitamin k, cetirizine, cyanocobalamin, cyanocobalamin, dextromethorphan-guaifenesin, famotidine, fluoxetine, levothyroxine, nutritional supplements, omeprazole, omeprazole, syringe-needle  (disp) 3 ml, and ferrous sulfate. Current Outpatient Prescriptions on File Prior to Visit  Medication Sig Dispense Refill  . acetaminophen (TYLENOL) 500 MG tablet Take 500 mg by mouth every 6 (six) hours as needed for pain.     Marland Kitchen alendronate (FOSAMAX) 70 MG tablet Take 1 tablet (70 mg total) by mouth every 7 (seven) days. Take with a full glass of water on an empty stomach. 2 tablet 3  . Calcium-Vitamin D-Vitamin K (CALCIUM SOFT CHEWS PO) Take 3 tablets by mouth daily.    . cetirizine (ZYRTEC) 10 MG tablet Take 10 mg by mouth daily.    . cyanocobalamin (,VITAMIN B-12,) 1000 MCG/ML injection INJECT 1 ML INTRAMUSCULARLY MONTHLY AS DIRECTED 10 mL 11  . cyanocobalamin (,VITAMIN B-12,) 1000 MCG/ML injection INJECT 1 ML INTRAMUSCULARLY MONTHLY AS DIRECTED 10 mL 0  . dextromethorphan-guaiFENesin (MUCINEX DM) 30-600 MG per 12 hr tablet Take 1 tablet by mouth 2 (two) times daily as needed for cough.    . famotidine (PEPCID) 20 MG tablet Take 20 mg by mouth at bedtime.    Marland Kitchen FLUoxetine (PROZAC) 10 MG capsule Take 1 capsule (10 mg total) by mouth daily. 90 capsule 3  . levothyroxine (SYNTHROID, LEVOTHROID) 88 MCG tablet Take 1 tablet (88 mcg total) by mouth daily. 90 tablet 3  . Nutritional Supplements (JUICE PLUS FIBRE PO) Take 4 capsules by mouth daily.     Marland Kitchen omeprazole (PRILOSEC OTC) 20 MG tablet Take 20 mg by mouth daily.    Marland Kitchen omeprazole (PRILOSEC) 10 MG capsule     . SYRINGE-NEEDLE, DISP, 3 ML (B-D 3CC LUER-LOK SYR 25GX1") 25G X 1" 3 ML MISC USE FOR B12 SHOTS ONCE A MONTH 100 each 1  . ferrous sulfate 325 (65 FE) MG tablet Take 325 mg by mouth daily. Reported on 01/07/2015     No current facility-administered medications on file prior to visit.   She is allergic to aspartame and phenylalanine; pravastatin  sodium; and pseudoephedrine..  Review of Systems   Objective:    BP 122/78 mmHg  Pulse 70  Temp(Src) 97.9 F (36.6 C) (Oral)  Ht 5\' 6"  (1.676 m)  Wt 138 lb (62.596 kg)  BMI 22.28 kg/m2   SpO2 95% General appearance: alert, cooperative, appears stated age and no distress Eyes: conjunctivae/corneas clear. PERRL, EOM's intact. Fundi benign. Ears: normal TM's and external ear canals both ears Nose: green discharge, moderate congestion, turbinates red, swollen, sinus tenderness bilateral Throat: abnormal findings: mild oropharyngeal erythema Neck: mild anterior cervical adenopathy, no JVD, supple, symmetrical, trachea midline and thyroid not enlarged, symmetric, no tenderness/mass/nodules Lungs: clear to auscultation bilaterally Heart: S1, S2 normal    Assessment:    Acute bacterial sinusitis.    Plan:    Nasal steroids per medication orders. Antihistamines per medication orders. Augmentin per medication orders.

## 2015-01-07 NOTE — Patient Instructions (Signed)

## 2015-01-19 DIAGNOSIS — R2231 Localized swelling, mass and lump, right upper limb: Secondary | ICD-10-CM | POA: Diagnosis not present

## 2015-01-23 ENCOUNTER — Telehealth: Payer: Self-pay | Admitting: Internal Medicine

## 2015-01-23 ENCOUNTER — Telehealth: Payer: Self-pay

## 2015-01-23 NOTE — Telephone Encounter (Signed)
Auth submitted to [employer]. Awaiting approval. °

## 2015-01-23 NOTE — Telephone Encounter (Signed)
Needs [employer] referral to Dr. Carmel Sacramento with [facility] in [facility] to check hearing.  The office will not set up appointment until they get a referral

## 2015-01-23 NOTE — Telephone Encounter (Signed)
Returned pt call after message was left on the triage line.   LMOM for pt to call back.   RE: Rx refill rq and a medication question.

## 2015-01-24 MED ORDER — ALENDRONATE SODIUM 70 MG PO TABS: 70.0000 mg | ORAL_TABLET | ORAL | Status: DC

## 2015-01-24 NOTE — Telephone Encounter (Signed)
Received call from pt she states she received her fluoxetine in the mail from [employer], but this time the pill is different from the last one they sent. She stated this one is a little dark green cap, and before it was lighter with two line on the capsule. Verified if the bottle states 10 mg & which it does. Inform pt to contact [employer] it could be they got medication from a different manufacturer we wouldn't know what color the pills look like. Every manufacturer is different. Also she stated that she need new order for her generic fosamax. MD only sent for 2 months. Inform her will update script and send to [facility]...Johny Chess

## 2015-01-24 NOTE — Telephone Encounter (Signed)
[employer] Josem Kaufmann [id number] valid 01/23/2015 - 04/22/2015 for 6 visits

## 2015-01-27 DIAGNOSIS — R2231 Localized swelling, mass and lump, right upper limb: Secondary | ICD-10-CM | POA: Diagnosis not present

## 2015-01-30 DIAGNOSIS — M79644 Pain in right finger(s): Secondary | ICD-10-CM | POA: Diagnosis not present

## 2015-02-02 DIAGNOSIS — D849 Immunodeficiency, unspecified: Secondary | ICD-10-CM | POA: Diagnosis not present

## 2015-02-02 DIAGNOSIS — R2231 Localized swelling, mass and lump, right upper limb: Secondary | ICD-10-CM | POA: Diagnosis not present

## 2015-02-10 ENCOUNTER — Encounter: Payer: Self-pay | Admitting: Internal Medicine

## 2015-02-10 DIAGNOSIS — H9190 Unspecified hearing loss, unspecified ear: Secondary | ICD-10-CM

## 2015-02-13 ENCOUNTER — Encounter: Payer: Self-pay | Admitting: Internal Medicine

## 2015-02-13 DIAGNOSIS — H538 Other visual disturbances: Secondary | ICD-10-CM

## 2015-03-03 ENCOUNTER — Encounter: Payer: Self-pay | Admitting: Internal Medicine

## 2015-03-03 DIAGNOSIS — H903 Sensorineural hearing loss, bilateral: Secondary | ICD-10-CM | POA: Diagnosis not present

## 2015-04-21 DIAGNOSIS — H0100B Unspecified blepharitis left eye, upper and lower eyelids: Secondary | ICD-10-CM | POA: Insufficient documentation

## 2015-04-21 DIAGNOSIS — H16223 Keratoconjunctivitis sicca, not specified as Sjogren's, bilateral: Secondary | ICD-10-CM | POA: Insufficient documentation

## 2015-04-21 DIAGNOSIS — Z961 Presence of intraocular lens: Secondary | ICD-10-CM | POA: Diagnosis not present

## 2015-04-21 DIAGNOSIS — H01002 Unspecified blepharitis right lower eyelid: Secondary | ICD-10-CM | POA: Diagnosis not present

## 2015-04-21 DIAGNOSIS — H01001 Unspecified blepharitis right upper eyelid: Secondary | ICD-10-CM | POA: Diagnosis not present

## 2015-04-21 DIAGNOSIS — H18452 Nodular corneal degeneration, left eye: Secondary | ICD-10-CM | POA: Insufficient documentation

## 2015-04-21 DIAGNOSIS — H35363 Drusen (degenerative) of macula, bilateral: Secondary | ICD-10-CM | POA: Diagnosis not present

## 2015-04-21 DIAGNOSIS — Z8249 Family history of ischemic heart disease and other diseases of the circulatory system: Secondary | ICD-10-CM | POA: Diagnosis not present

## 2015-04-21 DIAGNOSIS — H0100A Unspecified blepharitis right eye, upper and lower eyelids: Secondary | ICD-10-CM | POA: Insufficient documentation

## 2015-04-21 DIAGNOSIS — H01004 Unspecified blepharitis left upper eyelid: Secondary | ICD-10-CM | POA: Diagnosis not present

## 2015-04-21 DIAGNOSIS — H01005 Unspecified blepharitis left lower eyelid: Secondary | ICD-10-CM | POA: Diagnosis not present

## 2015-06-16 ENCOUNTER — Telehealth: Payer: Self-pay | Admitting: Internal Medicine

## 2015-06-16 DIAGNOSIS — L989 Disorder of the skin and subcutaneous tissue, unspecified: Secondary | ICD-10-CM

## 2015-06-16 NOTE — Telephone Encounter (Signed)
Pt called request referral to go see dermatologist, [facility] Dr. Susie Cassette. She said there is a brown spot on her left breast near her underarm. It was brown now it is pink. Please advise, she want to get her body check, very concern. Pt has [employer] for insurance.

## 2015-06-16 NOTE — Telephone Encounter (Signed)
Referral done

## 2015-07-06 ENCOUNTER — Telehealth: Payer: Self-pay | Admitting: Internal Medicine

## 2015-07-06 ENCOUNTER — Encounter: Payer: Self-pay | Admitting: Family

## 2015-07-06 ENCOUNTER — Ambulatory Visit: Payer: Self-pay | Admitting: Internal Medicine

## 2015-07-06 ENCOUNTER — Ambulatory Visit (INDEPENDENT_AMBULATORY_CARE_PROVIDER_SITE_OTHER): Payer: Commercial Managed Care - HMO | Admitting: Family

## 2015-07-06 VITALS — BP 120/70 | HR 75 | Temp 98.1°F | Ht 66.0 in | Wt 136.0 lb

## 2015-07-06 DIAGNOSIS — M79643 Pain in unspecified hand: Secondary | ICD-10-CM

## 2015-07-06 DIAGNOSIS — D489 Neoplasm of uncertain behavior, unspecified: Secondary | ICD-10-CM | POA: Diagnosis not present

## 2015-07-06 DIAGNOSIS — R55 Syncope and collapse: Secondary | ICD-10-CM | POA: Diagnosis not present

## 2015-07-06 NOTE — Telephone Encounter (Signed)
Referral done

## 2015-07-06 NOTE — Patient Instructions (Signed)
Suspect vasovagal cause for syncope.  If recurs, please let us know so we can do further evaluation.  If there is no improvement in your symptoms, or if there is any worsening of symptoms, or if you have any additional concerns, please return for re-evaluation; or, if we are closed, consider going to the Emergency Room for evaluation if symptoms urgent.  Syncope, commonly known as fainting, is a temporary loss of consciousness. It occurs when the blood flow to the brain is reduced. Vasovagal syncope (also called neurocardiogenic syncope) is a fainting spell in which the blood flow to the brain is reduced because of a sudden drop in heart rate and blood pressure. Vasovagal syncope occurs when the brain and the cardiovascular system (blood vessels) do not adequately communicate and respond to each other. This is the most common cause of fainting. It often occurs in response to fear or some other type of emotional or physical stress. The body has a reaction in which the heart starts beating too slowly or the blood vessels expand, reducing blood pressure. This type of fainting spell is generally considered harmless. However, injuries can occur if a person takes a sudden fall during a fainting spell.  CAUSES  Vasovagal syncope occurs when a person's blood pressure and heart rate decrease suddenly, usually in response to a trigger. Many things and situations can trigger an episode. Some of these include:   Pain.   Fear.   The sight of blood or medical procedures, such as blood being drawn from a vein.   Common activities, such as coughing, swallowing, stretching, or going to the bathroom.   Emotional stress.   Prolonged standing, especially in a warm environment.   Lack of sleep or rest.   Prolonged lack of food.   Prolonged lack of fluids.   Recent illness.  The use of certain drugs that affect blood pressure, such as cocaine, alcohol, marijuana, inhalants, and opiates.  SYMPTOMS   Before the fainting episode, you may:   Feel dizzy or light headed.   Become pale.  Sense that you are going to faint.   Feel like the room is spinning.   Have tunnel vision, only seeing directly in front of you.   Feel sick to your stomach (nauseous).   See spots or slowly lose vision.   Hear ringing in your ears.   Have a headache.   Feel warm and sweaty.   Feel a sensation of pins and needles. During the fainting spell, you will generally be unconscious for no longer than a couple minutes before waking up and returning to normal. If you get up too quickly before your body can recover, you may faint again. Some twitching or jerky movements may occur during the fainting spell.  DIAGNOSIS  Your health care provider will ask about your symptoms, take a medical history, and perform a physical exam. Various tests may be done to rule out other causes of fainting. These may include blood tests and tests to check the heart, such as electrocardiography, echocardiography, and possibly an electrophysiology study. When other causes have been ruled out, a test may be done to check the body's response to changes in position (tilt table test). TREATMENT  Most cases of vasovagal syncope do not require treatment. Your health care provider may recommend ways to avoid fainting triggers and may provide home strategies for preventing fainting. If you must be exposed to a possible trigger, you can drink additional fluids to help reduce your chances of having  an episode of vasovagal syncope. If you have warning signs of an oncoming episode, you can respond by positioning yourself favorably (lying down). If your fainting spells continue, you may be given medicines to prevent fainting. Some medicines may help make you more resistant to repeated episodes of vasovagal syncope. Special exercises or compression stockings may be recommended. In rare cases, the surgical placement of a pacemaker is  considered. HOME CARE INSTRUCTIONS   Learn to identify the warning signs of vasovagal syncope.   Sit or lie down at the first warning sign of a fainting spell. If sitting, put your head down between your legs. If you lie down, swing your legs up in the air to increase blood flow to the brain.   Avoid hot tubs and saunas.  Avoid prolonged standing.  Drink enough fluids to keep your urine clear or pale yellow. Avoid caffeine.  Increase salt in your diet as directed by your health care provider.   If you have to stand for a long time, perform movements such as:   Crossing your legs.   Flexing and stretching your leg muscles.   Squatting.   Moving your legs.   Bending over.   Only take over-the-counter or prescription medicines as directed by your health care provider. Do not suddenly stop any medicines without asking your health care provider first. [place] IF:   Your fainting spells continue or happen more frequently in spite of treatment.   You lose consciousness for more than a couple minutes.  You have fainting spells during or after exercising or after being startled.   You have new symptoms that occur with the fainting spells, such as:   Shortness of breath.  Chest pain.   Irregular heartbeat.   You have episodes of twitching or jerky movements that last longer than a few seconds.  You have episodes of twitching or jerky movements without obvious fainting. SEEK IMMEDIATE MEDICAL CARE IF:   You have injuries or bleeding after a fainting spell.   You have episodes of twitching or jerky movements that last longer than 5 minutes.   You have more than one spell of twitching or jerky movements before returning to consciousness after fainting.   This information is not intended to replace advice given to you by your health care provider. Make sure you discuss any questions you have with your health care provider.   Document Released:  12/18/2011 Document Revised: 05/17/2014 Document Reviewed: 12/18/2011 Elsevier Interactive Patient Education [employer].

## 2015-07-06 NOTE — Telephone Encounter (Signed)
PLEASE NOTE: All timestamps contained within this report are represented as [place] Standard Time. CONFIDENTIALTY NOTICE: This fax transmission is intended only for the addressee. It contains information that is legally privileged, confidential or otherwise protected from use or disclosure. If you are not the intended recipient, you are strictly prohibited from reviewing, disclosing, copying using or disseminating any of this information or taking any action in reliance on or regarding this information. If you have received this fax in error, please notify us immediately by telephone so that we can arrange for its return to [place]. Phone: [phone], Toll-Free: [phone], Fax: [phone] Page: 1 of 1 Call Id: [id number] [facility] [place] Patient Name: Sheena Craig [facility] DOB: Jun 05, 1940 Initial Comment Has been having low BP, fainted on Wednesday morning when she got up quicker than normal. Nurse Assessment Nurse: Dimas Chyle, RN, Dellis Filbert Date/Time (Eastern Time): 07/06/2015 8:48:37 AM Confirm and document reason for call. If symptomatic, describe symptoms. You must click the next button to save text entered. ---Has been having low BP, fainted on Wednesday morning a week ago when she got up quicker than normal. B/P 120/86. Had dizziness last night. 88/64 yesterday. Usually [id number] systolic. Has the patient traveled out of the country within the last 30 days? ---No Does the patient have any new or worsening symptoms? ---Yes Will a triage be completed? ---Yes Related visit to physician within the last 2 weeks? ---No Does the PT have any chronic conditions? (i.e. diabetes, asthma, etc.) ---Yes List chronic conditions. ---hyperlipidemia Is this a behavioral health or substance abuse call? ---No Guidelines Guideline Title Affirmed Question Affirmed Note Low Blood Pressure Brief (now gone) dizziness  or lightheadedness after standing up or eating Final Disposition User See PCP When Office is Open (within 3 days) Dimas Chyle, RN, Dellis Filbert Referrals REFERRED TO PCP OFFICE Disagree/Comply: Comply Disagree/Comply: Comply

## 2015-07-06 NOTE — Progress Notes (Signed)
Subjective:    Patient ID: Sheena Craig, female    DOB: 1940/04/12, 75 y.o.   MRN: [id number]   Sheena Craig is a 75 y.o. female who presents today for an acute visit.    HPI Comments: Patient here for evaluation of dizziness and fluctuating blood pressure readings ( range from 88/64 to 107/72). Syncopal episode last week, did not hit head. LOC for a few seconds. Felt 'faint' feeling prior to passing out and tried to sit on the toilet however passed out before. Stays well hydrated. Endorses episodes of being flush. Episodes over the years of syncopal episodes, primarily when she is overheated. Denies exertional chest pain or pressure, numbness or tingling radiating to left arm or jaw, palpitations, , frequent headaches, changes in vision, or shortness of breath.   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 Had seen cardiology and pulmonologist at that time.     Past Medical History  Diagnosis Date  . ANEMIA-NOS 10/17/2006  . DEPRESSION 10/17/2006  . FIBROMYALGIA 02/09/2007  . GERD 10/17/2006  . HYPERLIPIDEMIA 10/17/2006  . HYPOTHYROIDISM 10/17/2006  . LUMBAR RADICULOPATHY, LEFT 10/06/2009  . MIGRAINE HEADACHE 10/17/2006  . OSTEOARTHRITIS 10/17/2006  . OSTEOPENIA 10/17/2006  . OSTEOPOROSIS 02/09/2007  . Sarcoidosis ([place]) 10/17/2006  . 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  . Osteopenia 11/23/2014   Allergies: Aspartame and phenylalanine; Pravastatin sodium; and Pseudoephedrine Current Outpatient Prescriptions on File Prior to Visit  Medication Sig Dispense Refill  . acetaminophen (TYLENOL) 500 MG tablet Take 500 mg by mouth every 6 (six) hours as needed for pain.     Marland Kitchen alendronate (FOSAMAX) 70 MG tablet Take 1 tablet (70 mg total) by mouth every 7 (seven) days. Take with a full glass of water on an empty stomach. 12 tablet 3  . Calcium-Vitamin D-Vitamin K (CALCIUM SOFT CHEWS PO) Take 3 tablets by mouth daily.    . cetirizine (ZYRTEC) 10 MG tablet  Take 10 mg by mouth daily.    . cyanocobalamin (,VITAMIN B-12,) 1000 MCG/ML injection INJECT 1 ML INTRAMUSCULARLY MONTHLY AS DIRECTED 10 mL 0  . dextromethorphan-guaiFENesin (MUCINEX DM) 30-600 MG per 12 hr tablet Take 1 tablet by mouth 2 (two) times daily as needed for cough.    . famotidine (PEPCID) 20 MG tablet Take 20 mg by mouth at bedtime.    . ferrous sulfate 325 (65 FE) MG tablet Take 325 mg by mouth daily. Reported on 01/07/2015    . FLUoxetine (PROZAC) 10 MG capsule Take 1 capsule (10 mg total) by mouth daily. 90 capsule 3  . fluticasone (FLONASE) 50 MCG/ACT nasal spray Place 2 sprays into both nostrils daily. 16 g 6  . levothyroxine (SYNTHROID, LEVOTHROID) 88 MCG tablet Take 1 tablet (88 mcg total) by mouth daily. 90 tablet 3  . Nutritional Supplements (JUICE PLUS FIBRE PO) Take 4 capsules by mouth daily.     Marland Kitchen omeprazole (PRILOSEC OTC) 20 MG tablet Take 20 mg by mouth daily.    . SYRINGE-NEEDLE, DISP, 3 ML (B-D 3CC LUER-LOK SYR 25GX1") 25G X 1" 3 ML MISC USE FOR B12 SHOTS ONCE A MONTH 100 each 1   No current facility-administered medications on file prior to visit.    Social History  Substance Use Topics  . Smoking status: Never Smoker   . Smokeless tobacco: Never Used  . Alcohol Use: No    Review of Systems  Constitutional: Negative for chills.  Respiratory: Negative for cough.  Objective:    BP 130/80 mmHg  Pulse 63  Temp(Src) 98.1 F (36.7 C) (Oral)  Ht 5\' 6"  (1.676 m)  Wt 136 lb (61.689 kg)  BMI 21.96 kg/m2  SpO2 96%   Physical Exam  Constitutional: She appears well-developed and well-nourished.  HENT:  Head: Normocephalic and atraumatic.  Right Ear: Hearing, tympanic membrane, external ear and ear canal normal. No swelling or tenderness. Tympanic membrane is not erythematous and not bulging. No middle ear effusion.  Left Ear: Tympanic membrane, external ear and ear canal normal. No swelling or tenderness. Tympanic membrane is not erythematous and not  bulging.  No middle ear effusion.  Nose: Nose normal. No rhinorrhea. Right sinus exhibits no maxillary sinus tenderness and no frontal sinus tenderness. Left sinus exhibits no maxillary sinus tenderness and no frontal sinus tenderness.  Mouth/Throat: Uvula is midline, oropharynx is clear and moist and mucous membranes are normal. No posterior oropharyngeal edema or posterior oropharyngeal erythema.  Eyes: Conjunctivae, EOM and lids are normal. Pupils are equal, round, and reactive to light. Lids are everted and swept, no foreign bodies found.  Normal fundus bilaterally  Cardiovascular: Normal rate, regular rhythm, normal heart sounds and normal pulses.   Pulmonary/Chest: Effort normal and breath sounds normal. She has no wheezes. She has no rhonchi. She has no rales.  Lymphadenopathy:       Head (right side): No submental, no submandibular, no tonsillar, no preauricular, no posterior auricular and no occipital adenopathy present.       Head (left side): No submental, no submandibular, no tonsillar, no preauricular, no posterior auricular and no occipital adenopathy present.    She has no cervical adenopathy.       Right cervical: No superficial cervical, no deep cervical and no posterior cervical adenopathy present.      Left cervical: No superficial cervical, no deep cervical and no posterior cervical adenopathy present.  Neurological: She is alert. She has normal strength. No cranial nerve deficit or sensory deficit. She displays a negative Romberg sign.  Reflex Scores:      Bicep reflexes are 2+ on the right side and 2+ on the left side.      Patellar reflexes are 2+ on the right side and 2+ on the left side. Grip equal and strong bilateral upper extremities. Gait strong and steady. Able to perform  finger-to-nose without difficulty.   Skin: Skin is warm and dry.  Psychiatric: She has a normal mood and affect. Her speech is normal and behavior is normal. Thought content normal.  Vitals  reviewed.   Orthostatic vitals laying 138/82, 59, sitting 134/74, 66 and standing 120/70, 75     Assessment & Plan:   1. Syncope, unspecified syncope type  Patient had prodromal symptoms "feeling faint" prior syncopal episode. She has a history of syncopal episodes going back decades per patient especially when overheated.   I'm very reassured by normal neurologic exam. BP WNL. Patient does not have orthostatic hypotension. Also reassured by her EKG and no significant structural findings on 2-D echo 2014 to suggest cardiac etiology. However patient and husband preference is to see cardiology again as echo is 74 years old. Placed referral.  EKG: Sinus bradycardia. No acute ischemia. When compared to prior EKGs ( 2016 and 2013), no significant changes. 2-D echo in 2014 shows normal ejection fraction. Showed mild regurgitation mitral valve.  - EKG 12-Lead -referral to cardiology    I have discontinued Ms. Magdalene Molly amoxicillin-clavulanate. I am also having her maintain her Nutritional  Supplements (JUICE PLUS FIBRE PO), ferrous sulfate, acetaminophen, Calcium-Vitamin D-Vitamin K (CALCIUM SOFT CHEWS PO), SYRINGE-NEEDLE (DISP) 3 ML, omeprazole, famotidine, dextromethorphan-guaiFENesin, cyanocobalamin, cetirizine, FLUoxetine, levothyroxine, fluticasone, and alendronate.   No orders of the defined types were placed in this encounter.     Start medications as prescribed and explained to patient on After Visit Summary ( AVS). Risks, benefits, and alternatives of the medications and treatment plan prescribed today were discussed, and patient expressed understanding.   Education regarding symptom management and diagnosis given to patient.   Follow-up:Plan follow-up and return precautions given if any worsening symptoms or change in condition.   Continue to follow with Cathlean Cower, MD for routine health maintenance.   Sheena Craig and I agreed with plan.   Mable Paris,  FNP

## 2015-07-06 NOTE — Telephone Encounter (Signed)
Is requesting referral to Dr. Milly Jakob for hand surgery

## 2015-07-06 NOTE — Telephone Encounter (Addendum)
Pt informed that referral has been placed. Sheena Craig is faxing referral over to [facility] to see Dr. Grandville Silos.

## 2015-07-06 NOTE — Telephone Encounter (Signed)
Please advise 

## 2015-07-06 NOTE — Progress Notes (Signed)
Pre visit review using our clinic review tool, if applicable. No additional management support is needed unless otherwise documented below in the visit note. 

## 2015-07-11 DIAGNOSIS — D489 Neoplasm of uncertain behavior, unspecified: Secondary | ICD-10-CM | POA: Diagnosis not present

## 2015-07-11 DIAGNOSIS — M720 Palmar fascial fibromatosis [Dupuytren]: Secondary | ICD-10-CM | POA: Diagnosis not present

## 2015-07-11 DIAGNOSIS — R2231 Localized swelling, mass and lump, right upper limb: Secondary | ICD-10-CM | POA: Diagnosis not present

## 2015-07-12 ENCOUNTER — Other Ambulatory Visit: Payer: Self-pay | Admitting: Orthopedic Surgery

## 2015-07-14 ENCOUNTER — Encounter (HOSPITAL_BASED_OUTPATIENT_CLINIC_OR_DEPARTMENT_OTHER): Payer: Self-pay | Admitting: *Deleted

## 2015-07-21 NOTE — H&P (Signed)
Sheena Craig is an 75 y.o. female.   CC / Reason for Visit: Right long finger problem HPI: This patient is a 75 year old, right-hand-dominant, female who presents today after having seen Dr. Berenice Primas and having an MRI with a mass at the base of her right long finger.  She indicates that she's had several visits and that she has finally decided that she would like the mass removed as it has begun affecting her ability to grip a steering wheel and other activities daily living.  Dr. Berenice Primas has kindly referred her to [place] for further evaluation and treatment.  Past Medical History  Diagnosis Date  . ANEMIA-NOS 10/17/2006  . DEPRESSION 10/17/2006  . FIBROMYALGIA 02/09/2007  . GERD 10/17/2006  . HYPERLIPIDEMIA 10/17/2006  . HYPOTHYROIDISM 10/17/2006  . LUMBAR RADICULOPATHY, LEFT 10/06/2009  . MIGRAINE HEADACHE 10/17/2006  . OSTEOARTHRITIS 10/17/2006  . OSTEOPENIA 10/17/2006  . OSTEOPOROSIS 02/09/2007  . Sarcoidosis ([place]) 10/17/2006  . 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  . Osteopenia 11/23/2014  . Anxiety   . Fibromyalgia     Past Surgical History  Procedure Laterality Date  . Lymph node biopsy  1968  . Cataract extraction      bilateral  . Colonoscopy    . Upper gastrointestinal endoscopy    . Pelvic fracture surgery  03-04-12     M[facility]-surgery at [facility] History  Problem Relation Age of Onset  . Alcohol abuse Father   . Anxiety disorder Father   . Heart disease Father   . Hypertension Mother   . Colon cancer Neg Hx   . Anxiety disorder Mother   . Heart disease Mother   . Celiac disease Other     nieces and nephews  . Breast cancer Maternal Aunt   . Uterine cancer Sister   . Rheum arthritis Father   . Rheum arthritis Mother   . Heart disease Brother    Social History:  reports that she has never smoked. She has never used smokeless tobacco. She reports that she does not drink alcohol or use illicit drugs.  Allergies:  Allergies  Allergen Reactions  .  Adhesive [Tape]     Causes skin breakdown  . Aspartame And Phenylalanine Other (See Comments)    GI upset,&pain  . Pravastatin Sodium     REACTION: GI upset  . Pseudoephedrine Other (See Comments)    "hyperactive"    No prescriptions prior to admission    No results found for this or any previous visit (from the past 48 hour(s)). No results found.  Review of Systems  All other systems reviewed and are negative.   Height 5\' 6"  (1.676 m), weight 61.689 kg (136 lb). Physical Exam  Constitutional:  WD, WN, NAD HEENT:  NCAT, EOMI Neuro/Psych:  Alert & oriented to person, place, and time; appropriate mood & affect Lymphatic: No generalized UE edema or lymphadenopathy Extremities / MSK:  Both UE are normal with respect to appearance, ranges of motion, joint stability, muscle strength/tone, sensation, & perfusion except as otherwise noted:  The patient has a mass at the base of her right long finger which according to the MRI scan measures to be 17 x 17 mm.  It is firm but not painful to palpation.  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  Digital range of motion is only restricted at the long finger MP secondary to the mass.  The patient does have full sensation and good capillary refill at  this time.  Labs / X-rays:  No radiographic studies obtained today.  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  Please see MRI evaluation for further information.  Assessment: Right long finger mass versus Dupuytren's  Plan:  Findings are discussed with the patient.  She is made aware that some of the risks she may experience with excision of this mass could include neurovascular effects involving one or both digital nerves as well as blood vessels to the digit.  She plans to move forward once she evaluates her schedule.  Our surgery coordinator will contact her to schedule. The details of the operative  procedure were discussed with the patient.  Questions were invited and answered.  In addition to the goal of the procedure, the risks of the procedure to include but not limited to bleeding; infection; damage to the nerves or blood vessels that could result in bleeding, numbness, weakness, chronic pain, and the need for additional procedures; stiffness; the need for revision surgery; and anesthetic risks were reviewed.  No specific outcome was guaranteed or implied.  Informed consent was obtained.   Franny Selvage A., MD 07/21/2015, 4:37 AM

## 2015-07-23 ENCOUNTER — Other Ambulatory Visit: Payer: Self-pay | Admitting: Internal Medicine

## 2015-07-23 NOTE — Anesthesia Preprocedure Evaluation (Addendum)
Anesthesia Evaluation  Patient identified by MRN, date of birth, ID band Patient awake    Reviewed: Allergy & Precautions, H&P , NPO status , Patient's Chart, lab work & pertinent test results  History of Anesthesia Complications Negative for: history of anesthetic complications  Airway Mallampati: II  TM Distance: >3 FB Neck ROM: full    Dental no notable dental hx.    Pulmonary neg pulmonary ROS,    Pulmonary exam normal breath sounds clear to auscultation       Cardiovascular Normal cardiovascular exam Rhythm:regular Rate:Normal  2014 Echo with normal EF 55% and mild MR   Neuro/Psych  Headaches, Anxiety Depression  Neuromuscular disease    GI/Hepatic Neg liver ROS, GERD  ,  Endo/Other  Hypothyroidism   Renal/GU negative Renal ROS     Musculoskeletal  (+) Arthritis , Fibromyalgia -  Abdominal   Peds  Hematology   Anesthesia Other Findings   Reproductive/Obstetrics negative OB ROS                           Anesthesia Physical Anesthesia Plan  ASA: II  Anesthesia Plan: Regional   Post-op Pain Management:    Induction: Intravenous  Airway Management Planned: Simple Face Mask  Additional Equipment:   Intra-op Plan:   Post-operative Plan:   Informed Consent: I have reviewed the patients History and Physical, chart, labs and discussed the procedure including the risks, benefits and alternatives for the proposed anesthesia with the patient or authorized representative who has indicated his/her understanding and acceptance.   Dental Advisory Given  Plan Discussed with: Anesthesiologist, CRNA and Surgeon  Anesthesia Plan Comments:        Anesthesia Quick Evaluation

## 2015-07-24 ENCOUNTER — Ambulatory Visit (HOSPITAL_BASED_OUTPATIENT_CLINIC_OR_DEPARTMENT_OTHER): Payer: Commercial Managed Care - HMO | Admitting: Anesthesiology

## 2015-07-24 ENCOUNTER — Encounter (HOSPITAL_BASED_OUTPATIENT_CLINIC_OR_DEPARTMENT_OTHER): Payer: Self-pay | Admitting: Certified Registered"

## 2015-07-24 ENCOUNTER — Ambulatory Visit (HOSPITAL_BASED_OUTPATIENT_CLINIC_OR_DEPARTMENT_OTHER)
Admission: RE | Admit: 2015-07-24 | Discharge: 2015-07-24 | Disposition: A | Discharge status: Home / Self Care | Payer: Commercial Managed Care - HMO | Source: Ambulatory Visit | Attending: Orthopedic Surgery | Admitting: Orthopedic Surgery

## 2015-07-24 ENCOUNTER — Encounter (HOSPITAL_BASED_OUTPATIENT_CLINIC_OR_DEPARTMENT_OTHER)
Admission: RE | Disposition: A | Discharge status: Home / Self Care | Payer: Self-pay | Source: Ambulatory Visit | Attending: Orthopedic Surgery

## 2015-07-24 DIAGNOSIS — Z79899 Other long term (current) drug therapy: Secondary | ICD-10-CM | POA: Insufficient documentation

## 2015-07-24 DIAGNOSIS — Z7983 Long term (current) use of bisphosphonates: Secondary | ICD-10-CM | POA: Diagnosis not present

## 2015-07-24 DIAGNOSIS — D481 Neoplasm of uncertain behavior of connective and other soft tissue: Secondary | ICD-10-CM | POA: Insufficient documentation

## 2015-07-24 DIAGNOSIS — E039 Hypothyroidism, unspecified: Secondary | ICD-10-CM | POA: Insufficient documentation

## 2015-07-24 DIAGNOSIS — D510 Vitamin B12 deficiency anemia due to intrinsic factor deficiency: Secondary | ICD-10-CM | POA: Insufficient documentation

## 2015-07-24 DIAGNOSIS — R2231 Localized swelling, mass and lump, right upper limb: Secondary | ICD-10-CM | POA: Diagnosis not present

## 2015-07-24 DIAGNOSIS — M810 Age-related osteoporosis without current pathological fracture: Secondary | ICD-10-CM | POA: Diagnosis not present

## 2015-07-24 DIAGNOSIS — D869 Sarcoidosis, unspecified: Secondary | ICD-10-CM | POA: Diagnosis not present

## 2015-07-24 DIAGNOSIS — F329 Major depressive disorder, single episode, unspecified: Secondary | ICD-10-CM | POA: Insufficient documentation

## 2015-07-24 DIAGNOSIS — K219 Gastro-esophageal reflux disease without esophagitis: Secondary | ICD-10-CM | POA: Diagnosis not present

## 2015-07-24 HISTORY — DX: Fibromyalgia: M79.7

## 2015-07-24 HISTORY — DX: Anxiety disorder, unspecified: F41.9

## 2015-07-24 HISTORY — PX: EXCISION METACARPAL MASS: SHX6372

## 2015-07-24 SURGERY — EXCISION METACARPAL MASS
Anesthesia: Regional | Site: Finger | Laterality: Right

## 2015-07-24 MED ORDER — LIDOCAINE 2% (20 MG/ML) 5 ML SYRINGE
INTRAMUSCULAR | Status: AC
  Filled 2015-07-24: qty 5

## 2015-07-24 MED ORDER — MIDAZOLAM HCL 2 MG/2ML IJ SOLN
1.0000 mg | INTRAMUSCULAR | Status: DC | PRN
  Administered 2015-07-24: 1 mg via INTRAVENOUS

## 2015-07-24 MED ORDER — PROMETHAZINE HCL 25 MG/ML IJ SOLN: 6.2500 mg | INTRAMUSCULAR | Status: DC | PRN

## 2015-07-24 MED ORDER — SCOPOLAMINE 1 MG/3DAYS TD PT72: 1.0000 | MEDICATED_PATCH | Freq: Once | TRANSDERMAL | Status: DC | PRN

## 2015-07-24 MED ORDER — ONDANSETRON HCL 4 MG/2ML IJ SOLN
INTRAMUSCULAR | Status: AC
  Filled 2015-07-24: qty 4

## 2015-07-24 MED ORDER — LACTATED RINGERS IV SOLN: INTRAVENOUS | Status: DC

## 2015-07-24 MED ORDER — FENTANYL CITRATE (PF) 100 MCG/2ML IJ SOLN
INTRAMUSCULAR | Status: AC
  Filled 2015-07-24: qty 2

## 2015-07-24 MED ORDER — HYDROCODONE-ACETAMINOPHEN 5-325 MG PO TABS: 1.0000 | ORAL_TABLET | ORAL | Status: DC | PRN

## 2015-07-24 MED ORDER — MIDAZOLAM HCL 2 MG/2ML IJ SOLN
INTRAMUSCULAR | Status: AC
  Filled 2015-07-24: qty 2

## 2015-07-24 MED ORDER — GLYCOPYRROLATE 0.2 MG/ML IJ SOLN: 0.2000 mg | Freq: Once | INTRAMUSCULAR | Status: DC | PRN

## 2015-07-24 MED ORDER — PROPOFOL 500 MG/50ML IV EMUL
INTRAVENOUS | Status: DC | PRN
  Administered 2015-07-24: 75 ug/kg/min via INTRAVENOUS

## 2015-07-24 MED ORDER — CEFAZOLIN SODIUM-DEXTROSE 2-4 GM/100ML-% IV SOLN
2.0000 g | INTRAVENOUS | Status: AC
  Administered 2015-07-24: 2 g via INTRAVENOUS

## 2015-07-24 MED ORDER — LACTATED RINGERS IV SOLN
INTRAVENOUS | Status: DC
  Administered 2015-07-24: 10 mL/h via INTRAVENOUS

## 2015-07-24 MED ORDER — FENTANYL CITRATE (PF) 100 MCG/2ML IJ SOLN: 25.0000 ug | INTRAMUSCULAR | Status: DC | PRN

## 2015-07-24 MED ORDER — FENTANYL CITRATE (PF) 100 MCG/2ML IJ SOLN
50.0000 ug | INTRAMUSCULAR | Status: DC | PRN
  Administered 2015-07-24: 25 ug via INTRAVENOUS

## 2015-07-24 MED ORDER — BUPIVACAINE-EPINEPHRINE (PF) 0.5% -1:200000 IJ SOLN
INTRAMUSCULAR | Status: DC | PRN
  Administered 2015-07-24: 20 mL via PERINEURAL

## 2015-07-24 MED ORDER — CEFAZOLIN SODIUM-DEXTROSE 2-4 GM/100ML-% IV SOLN
INTRAVENOUS | Status: AC
  Filled 2015-07-24: qty 100

## 2015-07-24 MED ORDER — DEXAMETHASONE SODIUM PHOSPHATE 10 MG/ML IJ SOLN
INTRAMUSCULAR | Status: AC
  Filled 2015-07-24: qty 1

## 2015-07-24 MED ORDER — LIDOCAINE HCL (CARDIAC) 20 MG/ML IV SOLN
INTRAVENOUS | Status: DC | PRN
  Administered 2015-07-24: 30 mg via INTRAVENOUS

## 2015-07-24 SURGICAL SUPPLY — 47 items
BANDAGE COBAN STERILE 2 (GAUZE/BANDAGES/DRESSINGS) IMPLANT
BLADE MINI RND TIP GREEN BEAV (BLADE) IMPLANT
BLADE SURG 15 STRL LF DISP TIS (BLADE) ×1 IMPLANT
BLADE SURG 15 STRL SS (BLADE) ×2
BNDG COHESIVE 1X5 TAN STRL LF (GAUZE/BANDAGES/DRESSINGS) IMPLANT
BNDG COHESIVE 4X5 TAN STRL (GAUZE/BANDAGES/DRESSINGS) ×3 IMPLANT
BNDG CONFORM 2 STRL LF (GAUZE/BANDAGES/DRESSINGS) IMPLANT
BNDG ESMARK 4X9 LF (GAUZE/BANDAGES/DRESSINGS) IMPLANT
BNDG GAUZE 1X2.1 STRL (MISCELLANEOUS) IMPLANT
BNDG GAUZE ELAST 4 BULKY (GAUZE/BANDAGES/DRESSINGS) ×3 IMPLANT
CHLORAPREP W/TINT 26ML (MISCELLANEOUS) ×3 IMPLANT
CORDS BIPOLAR (ELECTRODE) IMPLANT
COVER BACK TABLE 60X90IN (DRAPES) ×3 IMPLANT
COVER MAYO STAND STRL (DRAPES) ×3 IMPLANT
CUFF TOURNIQUET SINGLE 18IN (TOURNIQUET CUFF) IMPLANT
DRAIN PENROSE 1/2X12 LTX STRL (WOUND CARE) IMPLANT
DRAPE EXTREMITY T 121X128X90 (DRAPE) ×3 IMPLANT
DRAPE SURG 17X23 STRL (DRAPES) ×3 IMPLANT
DRSG EMULSION OIL 3X3 NADH (GAUZE/BANDAGES/DRESSINGS) ×3 IMPLANT
GLOVE BIO SURGEON STRL SZ7.5 (GLOVE) ×3 IMPLANT
GLOVE BIOGEL PI IND STRL 7.0 (GLOVE) ×2 IMPLANT
GLOVE BIOGEL PI IND STRL 8 (GLOVE) ×1 IMPLANT
GLOVE BIOGEL PI INDICATOR 7.0 (GLOVE) ×4
GLOVE BIOGEL PI INDICATOR 8 (GLOVE) ×2
GLOVE ECLIPSE 6.5 STRL STRAW (GLOVE) ×3 IMPLANT
GOWN STRL REUS W/ TWL LRG LVL3 (GOWN DISPOSABLE) ×2 IMPLANT
GOWN STRL REUS W/TWL LRG LVL3 (GOWN DISPOSABLE) ×4
GOWN STRL REUS W/TWL XL LVL3 (GOWN DISPOSABLE) ×3 IMPLANT
NDL SAFETY ECLIPSE 18X1.5 (NEEDLE) IMPLANT
NEEDLE HYPO 18GX1.5 SHARP (NEEDLE)
NEEDLE HYPO 25X1 1.5 SAFETY (NEEDLE) IMPLANT
NS IRRIG 1000ML POUR BTL (IV SOLUTION) ×3 IMPLANT
PACK BASIN DAY SURGERY FS (CUSTOM PROCEDURE TRAY) ×3 IMPLANT
PADDING CAST ABS 4INX4YD NS (CAST SUPPLIES) ×2
PADDING CAST ABS COTTON 4X4 ST (CAST SUPPLIES) ×1 IMPLANT
RUBBERBAND STERILE (MISCELLANEOUS) IMPLANT
SLING ARM FOAM STRAP LRG (SOFTGOODS) ×3 IMPLANT
SPONGE GAUZE 4X4 12PLY STER LF (GAUZE/BANDAGES/DRESSINGS) ×3 IMPLANT
STOCKINETTE 6  STRL (DRAPES) ×2
STOCKINETTE 6 STRL (DRAPES) ×1 IMPLANT
SUT VICRYL RAPIDE 4-0 (SUTURE) IMPLANT
SUT VICRYL RAPIDE 4/0 PS 2 (SUTURE) IMPLANT
SYR BULB 3OZ (MISCELLANEOUS) ×3 IMPLANT
SYRINGE 10CC LL (SYRINGE) IMPLANT
TOWEL OR 17X24 6PK STRL BLUE (TOWEL DISPOSABLE) ×3 IMPLANT
TOWEL OR NON WOVEN STRL DISP B (DISPOSABLE) ×3 IMPLANT
UNDERPAD 30X30 (UNDERPADS AND DIAPERS) ×3 IMPLANT

## 2015-07-24 NOTE — Interval H&P Note (Signed)
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 M72.0  The various methods of treatment have been discussed with the patient and family. After consideration of risks, benefits and other options for treatment, the patient has consented to  Procedure(s): EXCISION OF RIGHT LONG FINGER DUPYTREN'S VS. MASS (Right) as a surgical intervention .  The patient's history has been reviewed, patient examined, no change in status, stable for surgery.  I have reviewed the patient's chart and labs.  Questions were answered to the patient's satisfaction.     Traniya Prichett A.

## 2015-07-24 NOTE — Discharge Instructions (Addendum)
Discharge Instructions   Move your fingers as much as possible, making a full fist and fully opening the fist. Elevate your hand to reduce pain & swelling of the digits.  Ice over the operative site may be helpful to reduce pain & swelling.  DO NOT USE HEAT. Pain medicine has been prescribed for you.  Use your medicine as needed over the first 48 hours, and then you can begin to taper your use.  You may use Tylenol in place of your prescribed pain medication, but not IN ADDITION to it. Leave the dressing in place until you return to our office.  You may shower, but keep the bandage clean & dry.  You may drive a car when you are off of prescription pain medications and can safely control your vehicle with both hands. Our office will call you to arrange follow-up   Please call [phone] during normal business hours or [phone] after hours for any problems. Including the following:  - excessive redness of the incisions - drainage for more than 4 days - fever of more than 101.5 F  *Please note that pain medications will not be refilled after hours or on weekends.  Regional Anesthesia Blocks  1. Numbness or the inability to move the "blocked" extremity may last from 3-48 hours after placement. The length of time depends on the medication injected and your individual response to the medication. If the numbness is not going away after 48 hours, call your surgeon.  2. The extremity that is blocked will need to be protected until the numbness is gone and the  Strength has returned. Because you cannot feel it, you will need to take extra care to avoid injury. Because it may be weak, you may have difficulty moving it or using it. You may not know what position it is in without looking at it while the block is in effect.  3. For blocks in the legs and feet, returning to weight bearing and walking needs to be done carefully. You will need to wait until the numbness is entirely gone and the  strength has returned. You should be able to move your leg and foot normally before you try and bear weight or walk. You will need someone to be with you when you first try to ensure you do not fall and possibly risk injury.  4. Bruising and tenderness at the needle site are common side effects and will resolve in a few days.  5. Persistent numbness or new problems with movement should be communicated to the surgeon or the [facility] [phone] [place] [phone]).  Post Anesthesia Home Care Instructions  Activity: Get plenty of rest for the remainder of the day. A responsible adult should stay with you for 24 hours following the procedure.  For the next 24 hours, DO NOT: -Drive a car -Paediatric nurse -Drink alcoholic beverages -Take any medication unless instructed by your physician -Make any legal decisions or sign important papers.  Meals: Start with liquid foods such as gelatin or soup. Progress to regular foods as tolerated. Avoid greasy, spicy, heavy foods. If nausea and/or vomiting occur, drink only clear liquids until the nausea and/or vomiting subsides. Call your physician if vomiting continues.  Special Instructions/Symptoms: Your throat may feel dry or sore from the anesthesia or the breathing tube placed in your throat during surgery. If this causes discomfort, gargle with warm salt water. The discomfort should disappear within 24 hours.  If you had a  scopolamine patch placed behind your ear for the management of post- operative nausea and/or vomiting:  1. The medication in the patch is effective for 72 hours, after which it should be removed.  Wrap patch in a tissue and discard in the trash. Wash hands thoroughly with soap and water. 2. You may remove the patch earlier than 72 hours if you experience unpleasant side effects which may include dry mouth, dizziness or visual disturbances. 3. Avoid touching the patch. Wash your hands with  soap and water after contact with the patch.

## 2015-07-24 NOTE — Op Note (Addendum)
07/24/2015  8:56 AM  PATIENT:  Marya Fossa  75 y.o. 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 Grandville Silos, MD  PHYSICIAN ASSISTANT: None  ANESTHESIA:  regional and MAC  SPECIMENS:  Masses x 2 to pathology  DRAINS:   None  EBL:  less than 50 mL  PREOPERATIVE INDICATIONS:  MINAMI ANCELET is a  75 y.o. female with a mass on the volar surface the right long finger, centered over the proximal phalanx, with another smaller mass more proximally.  There was also some possible thickening along the palmar fascia proximal to this.  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  The risks benefits and alternatives were discussed with the patient preoperatively including but not limited to the risks of infection, bleeding, nerve injury, cardiopulmonary complications, the need for revision surgery, among others, and the patient verbalized understanding and consented to proceed.  OPERATIVE IMPLANTS: None  OPERATIVE PROCEDURE:  After receiving prophylactic antibiotics and a regional block, the patient was escorted to the operative theatre and placed in a supine position.  A surgical "time-out" was performed during which the planned procedure, proposed operative site, and the correct patient identity were compared to the operative consent and agreement confirmed by the circulating nurse according to current facility policy.  Following application of a tourniquet to the operative extremity, the exposed skin was prepped with Chloraprep and draped in the usual sterile  fashion.  The limb was exsanguinated with an Esmarch bandage and the tourniquet inflated to approximately 157mmHg higher than systolic BP.  A 2 limb zigzag incision was made over the mass, with the apex at the proximal digital crease.  This created a radially based flap.  The flap was elevated full-thickness.  Proximally the neurovascular structures were identified and dissected free distally.  Each was in close approximation to the mass, but did not go within it.  The mass had a firmness, nodularity, and color consistent with giant cell tumor of tendon sheath.  The more proximal mass was about a centimeter in diameter and was distinctly different from the major mass.  Major mass was about 3 cm and adherent to the A1 pulley.  It was meticulously marginally excised, which did sacrifice just a small portion of the A2 pulley.  The proximal one was excised in the same fashion.  There was some distinct condensation of the pretendinous band proximal to this, and it was simply divided.  The was no resultant tightness to any palmar fascia bands following this.  With the mass successfully excised and on the back table to become surgical specimen, the tourniquet was released and the wound was copiously irrigated.  Some additional hemostasis was obtained with bipolar electrocautery and the skin was closed with 4-0 Vicryl Rapide in rapid sutures.  A bulky hand dressing was applied and she was taken to the recovery room in stable condition, breathing spontaneously  DISPOSITION: She'll be discharged home today with typical instructions, returning in 10-15 days.

## 2015-07-24 NOTE — Transfer of Care (Signed)
Immediate Anesthesia Transfer of Care Note  Patient: Sheena Craig  Procedure(s) Performed: Procedure(s): EXCISION OF RIGHT LONG FINGER  MASS (Right)  Patient Location: PACU  Anesthesia Type:MAC combined with regional for post-op pain  Level of Consciousness: awake, alert , oriented and patient cooperative  Airway & Oxygen Therapy: Patient Spontanous Breathing and Patient connected to face mask oxygen  Post-op Assessment: Report given to RN and Post -op Vital signs reviewed and stable  Post vital signs: Reviewed and stable  Last Vitals:  Filed Vitals:   07/24/15 0850 07/24/15 0855  BP:  122/59  Pulse: 55 59  Temp:    Resp: 11 12    Last Pain: There were no vitals filed for this visit.       Complications: No apparent anesthesia complications

## 2015-07-24 NOTE — Anesthesia Procedure Notes (Addendum)
Procedure Name: MAC Date/Time: 07/24/2015 9:15 AM Performed by: BLOCKER, TIMOTHY D Pre-anesthesia Checklist: Patient identified, Emergency Drugs available, Suction available, Patient being monitored and Timeout performed Patient Re-evaluated:Patient Re-evaluated prior to inductionOxygen Delivery Method: Simple face mask   Anesthesia Regional Block:  Supraclavicular block  Pre-Anesthetic Checklist: ,, timeout performed, Correct Patient, Correct Site, Correct Laterality, Correct Procedure, Correct Position, site marked, Risks and benefits discussed,  Surgical consent,  Pre-op evaluation,  At surgeon's request and post-op pain management  Laterality: Right  Prep: chloraprep       Needles:  Injection technique: Single-shot  Needle Type: Stimiplex     Needle Length: 9cm 9 cm Needle Gauge: 21 and 21 G    Additional Needles:  Procedures: ultrasound guided (picture in chart) Supraclavicular block Narrative:  Injection made incrementally with aspirations every 5 mL.  Performed by: Personally  Anesthesiologist: Jalaila Caradonna  Additional Notes: Risks, benefits and alternative to block explained extensively.  Patient tolerated procedure well, without complications.

## 2015-07-24 NOTE — Anesthesia Postprocedure Evaluation (Signed)
Anesthesia Post Note  Patient: Sheena Craig  Procedure(s) Performed: Procedure(s) (LRB): EXCISION OF RIGHT LONG FINGER  MASS (Right)  Patient location during evaluation: PACU Anesthesia Type: Regional Level of consciousness: awake and alert Pain management: pain level controlled Vital Signs Assessment: post-procedure vital signs reviewed and stable Respiratory status: spontaneous breathing, nonlabored ventilation, respiratory function stable and patient connected to nasal cannula oxygen Cardiovascular status: stable and blood pressure returned to baseline Anesthetic complications: no    Last Vitals:  Filed Vitals:   07/24/15 0951 07/24/15 0952  BP:  86/68  Pulse: 67 66  Temp:  36.5 C  Resp:  16    Last Pain:  Filed Vitals:   07/24/15 1000  PainSc: 0-No pain                 Zenaida Deed

## 2015-07-24 NOTE — Progress Notes (Signed)
Assisted Dr. [place] Pink with right, ultrasound guided, supraclavicular block. Side rails up, monitors on throughout procedure. See vital signs in flow sheet. Tolerated Procedure well.

## 2015-07-27 ENCOUNTER — Encounter (HOSPITAL_BASED_OUTPATIENT_CLINIC_OR_DEPARTMENT_OTHER): Payer: Self-pay | Admitting: Orthopedic Surgery

## 2015-07-31 DIAGNOSIS — R2231 Localized swelling, mass and lump, right upper limb: Secondary | ICD-10-CM | POA: Diagnosis not present

## 2015-07-31 DIAGNOSIS — D489 Neoplasm of uncertain behavior, unspecified: Secondary | ICD-10-CM | POA: Diagnosis not present

## 2015-08-03 ENCOUNTER — Encounter: Payer: Self-pay | Admitting: Cardiovascular Disease

## 2015-08-03 ENCOUNTER — Ambulatory Visit (INDEPENDENT_AMBULATORY_CARE_PROVIDER_SITE_OTHER): Payer: Commercial Managed Care - HMO | Admitting: Cardiovascular Disease

## 2015-08-03 VITALS — BP 108/60 | HR 71 | Ht 66.0 in | Wt 134.1 lb

## 2015-08-03 DIAGNOSIS — I2510 Atherosclerotic heart disease of native coronary artery without angina pectoris: Secondary | ICD-10-CM | POA: Diagnosis not present

## 2015-08-03 DIAGNOSIS — I951 Orthostatic hypotension: Secondary | ICD-10-CM | POA: Insufficient documentation
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
## 2015-08-03 NOTE — Patient Instructions (Addendum)
Increase your intake of fluids (water with electrolyte tabs like Nun tablets, or gatorade) , protein ( hard boiled eggs, chicken, fish) , and a electrolytes ( V-8 juice, salt, potassium chloride  which is sold as No-Salt  Medication Instructions:  Your physician recommends that you continue on your current medications as directed. Please refer to the Current Medication list given to you today.   Labwork: None Ordered   Testing/Procedures: None Ordered   Follow-Up: Your physician recommends that you schedule a follow-up appointment in: Lipid clinic for management of your high cholesterol and atherosclerosis  Your physician wants you to follow-up in: 6 months with Dr. Acie Fredrickson.  You will receive a reminder letter in the mail two months in advance. If you don't receive a letter, please call our office to schedule the follow-up appointment.   If you need a refill on your cardiac medications before your next appointment, please call your pharmacy.   Thank you for choosing [facility]! Christen Bame, RN [phone]

## 2015-08-24 ENCOUNTER — Ambulatory Visit (INDEPENDENT_AMBULATORY_CARE_PROVIDER_SITE_OTHER): Payer: Commercial Managed Care - HMO | Admitting: Pharmacist

## 2015-08-24 VITALS — Wt 135.8 lb

## 2015-08-24 DIAGNOSIS — E785 Hyperlipidemia, unspecified: Secondary | ICD-10-CM
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
## 2015-08-25 ENCOUNTER — Other Ambulatory Visit: Payer: Commercial Managed Care - HMO | Admitting: *Deleted

## 2015-08-25 DIAGNOSIS — E785 Hyperlipidemia, unspecified: Secondary | ICD-10-CM | POA: Diagnosis not present

## 2015-08-25 LAB — LIPID PANEL
CHOLESTEROL: 260 mg/dL — AB (ref 125–200)
HDL: 63 mg/dL (ref >=46)
LDL Cholesterol: 173 mg/dL — ABNORMAL HIGH (ref <130)
TRIGLYCERIDES: 122 mg/dL (ref <150)
Total CHOL/HDL Ratio: 4.1 Ratio (ref <=5.0)
VLDL: 24 mg/dL (ref <30)

## 2015-08-28 ENCOUNTER — Telehealth: Payer: Self-pay | Admitting: Pharmacist

## 2015-08-28 DIAGNOSIS — E785 Hyperlipidemia, unspecified: Secondary | ICD-10-CM

## 2015-08-28 MED ORDER — ROSUVASTATIN CALCIUM 10 MG PO TABS: 10.0000 mg | ORAL_TABLET | Freq: Every day | ORAL | 11 refills | Status: DC

## 2015-08-28 NOTE — Telephone Encounter (Signed)
Discussed lipid panel results with patient. LDL 170mg /dL above goal < 70mg /dL. Previously intolerant to pravastatin. Pt willing to try lower dose of Crestor 10mg  once daily. Will plan to recheck lipid panel in 3 months. Pt already scheduled to see Dr. Jenny Reichmann at the beginning of November, will route note to him to ensure lipid panel is checked at this visit.

## 2015-09-05 DIAGNOSIS — L821 Other seborrheic keratosis: Secondary | ICD-10-CM | POA: Diagnosis not present

## 2015-11-08 ENCOUNTER — Other Ambulatory Visit (INDEPENDENT_AMBULATORY_CARE_PROVIDER_SITE_OTHER): Payer: Commercial Managed Care - HMO

## 2015-11-08 DIAGNOSIS — Z0000 Encounter for general adult medical examination without abnormal findings: Secondary | ICD-10-CM | POA: Diagnosis not present

## 2015-11-08 LAB — URINALYSIS, ROUTINE W REFLEX MICROSCOPIC
Bilirubin Urine: NEGATIVE
HGB URINE DIPSTICK: NEGATIVE
Ketones, ur: NEGATIVE
Leukocytes, UA: NEGATIVE
NITRITE: NEGATIVE
Specific Gravity, Urine: 1.01 (ref 1.000–1.030)
Total Protein, Urine: NEGATIVE
URINE GLUCOSE: NEGATIVE
Urobilinogen, UA: 0.2 (ref 0.0–1.0)
pH: 8 (ref 5.0–8.0)

## 2015-11-08 LAB — CBC WITH DIFFERENTIAL/PLATELET
BASOS PCT: 0.8 % (ref 0.0–3.0)
Basophils Absolute: 0 10*3/uL (ref 0.0–0.1)
EOS PCT: 4.6 % (ref 0.0–5.0)
Eosinophils Absolute: 0.3 10*3/uL (ref 0.0–0.7)
HEMATOCRIT: 39.4 % (ref 36.0–46.0)
HEMOGLOBIN: 13.3 g/dL (ref 12.0–15.0)
LYMPHS PCT: 29 % (ref 12.0–46.0)
Lymphs Abs: 1.7 10*3/uL (ref 0.7–4.0)
MCHC: 33.8 g/dL (ref 30.0–36.0)
MCV: 92.7 fl (ref 78.0–100.0)
Monocytes Absolute: 0.5 10*3/uL (ref 0.1–1.0)
Monocytes Relative: 8.1 % (ref 3.0–12.0)
NEUTROS ABS: 3.3 10*3/uL (ref 1.4–7.7)
Neutrophils Relative %: 57.5 % (ref 43.0–77.0)
PLATELETS: 373 10*3/uL (ref 150.0–400.0)
RBC: 4.25 Mil/uL (ref 3.87–5.11)
RDW: 13.7 % (ref 11.5–15.5)
WBC: 5.7 10*3/uL (ref 4.0–10.5)

## 2015-11-08 LAB — HEPATIC FUNCTION PANEL
ALT: 15 U/L (ref 0–35)
AST: 19 U/L (ref 0–37)
Albumin: 4.2 g/dL (ref 3.5–5.2)
Alkaline Phosphatase: 38 U/L — ABNORMAL LOW (ref 39–117)
BILIRUBIN DIRECT: 0.1 mg/dL (ref 0.0–0.3)
BILIRUBIN TOTAL: 0.4 mg/dL (ref 0.2–1.2)
Total Protein: 6.9 g/dL (ref 6.0–8.3)

## 2015-11-08 LAB — BASIC METABOLIC PANEL
BUN: 9 mg/dL (ref 6–23)
CHLORIDE: 106 meq/L (ref 96–112)
CO2: 29 mEq/L (ref 19–32)
CREATININE: 0.84 mg/dL (ref 0.40–1.20)
Calcium: 9.6 mg/dL (ref 8.4–10.5)
GFR: 70.44 mL/min (ref >60.00)
Glucose, Bld: 80 mg/dL (ref 70–99)
POTASSIUM: 4.8 meq/L (ref 3.5–5.1)
Sodium: 140 mEq/L (ref 135–145)

## 2015-11-08 LAB — LIPID PANEL
CHOL/HDL RATIO: 3
Cholesterol: 150 mg/dL (ref 0–200)
HDL: 59.5 mg/dL (ref >39.00)
LDL CALC: 71 mg/dL (ref 0–99)
NONHDL: 90.79
TRIGLYCERIDES: 97 mg/dL (ref 0.0–149.0)
VLDL: 19.4 mg/dL (ref 0.0–40.0)

## 2015-11-08 LAB — TSH: TSH: 15.05 u[IU]/mL — ABNORMAL HIGH (ref 0.35–4.50)

## 2015-11-17 ENCOUNTER — Ambulatory Visit (INDEPENDENT_AMBULATORY_CARE_PROVIDER_SITE_OTHER): Payer: Commercial Managed Care - HMO | Admitting: Internal Medicine

## 2015-11-17 ENCOUNTER — Encounter: Payer: Self-pay | Admitting: Internal Medicine

## 2015-11-17 VITALS — BP 130/72 | HR 75 | Temp 98.1°F | Resp 20 | Wt 137.0 lb

## 2015-11-17 DIAGNOSIS — E039 Hypothyroidism, unspecified: Secondary | ICD-10-CM | POA: Diagnosis not present

## 2015-11-17 DIAGNOSIS — M533 Sacrococcygeal disorders, not elsewhere classified: Secondary | ICD-10-CM

## 2015-11-17 DIAGNOSIS — Z0000 Encounter for general adult medical examination without abnormal findings: Secondary | ICD-10-CM | POA: Diagnosis not present

## 2015-11-17 DIAGNOSIS — W57XXXA Bitten or stung by nonvenomous insect and other nonvenomous arthropods, initial encounter: Secondary | ICD-10-CM

## 2015-11-17 DIAGNOSIS — R1310 Dysphagia, unspecified: Secondary | ICD-10-CM

## 2015-11-17 DIAGNOSIS — Z0001 Encounter for general adult medical examination with abnormal findings: Secondary | ICD-10-CM

## 2015-11-17 MED ORDER — FLUOXETINE HCL 10 MG PO CAPS: 10.0000 mg | ORAL_CAPSULE | Freq: Every day | ORAL | 3 refills | Status: DC

## 2015-11-17 MED ORDER — PANTOPRAZOLE SODIUM 40 MG PO TBEC: 40.0000 mg | DELAYED_RELEASE_TABLET | Freq: Every day | ORAL | 3 refills | Status: DC

## 2015-11-17 MED ORDER — LEVOTHYROXINE SODIUM 100 MCG PO TABS: 100.0000 ug | ORAL_TABLET | Freq: Every day | ORAL | 3 refills | Status: DC

## 2015-11-17 NOTE — Progress Notes (Signed)
Pre visit review using our clinic review tool, if applicable. No additional management support is needed unless otherwise documented below in the visit note. 
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
## 2015-11-17 NOTE — Patient Instructions (Addendum)
OK to increase the levothyroxine to 100 mcg per day  Please go to the LAB in the Basement (turn left off the elevator) for the tests to be done in 4 weeks (the TSH, and Free T4)  OK to stop the omeprazole OTC  Please take all new medication as prescribed - the protonix 40 mg per day  Please call in 2 weeks if not better, to consider a Barium Swallow for the swallowing difficulty  Please continue all other medications as before, and refills have been done if requested.  Please have the pharmacy call with any other refills you may need.  Please continue your efforts at being more active, low cholesterol diet, and weight control.  You are otherwise up to date with prevention measures today.  You will be contacted regarding the referral for: Dr Tamala Julian for the pelvic/sacral pain; you can also make an appt as you leave at the scheduling desk  Please keep your appointments with your specialists as you may have planned  Please return in 6 months, or sooner if needed

## 2015-11-19 DIAGNOSIS — R1310 Dysphagia, unspecified: Secondary | ICD-10-CM | POA: Insufficient documentation

## 2015-11-19 DIAGNOSIS — M533 Sacrococcygeal disorders, not elsewhere classified: Secondary | ICD-10-CM | POA: Insufficient documentation

## 2015-11-19 DIAGNOSIS — W57XXXA Bitten or stung by nonvenomous insect and other nonvenomous arthropods, initial encounter: Secondary | ICD-10-CM | POA: Insufficient documentation

## 2015-11-19 NOTE — Assessment & Plan Note (Signed)
Mild, ok for change omeprazole 20 otc to protonix 40, consider barium swallow or GI referral if not improved

## 2015-11-19 NOTE — Assessment & Plan Note (Signed)
Etiology unclear, ok for referral Dr Tamala Julian, sport med

## 2015-11-19 NOTE — Assessment & Plan Note (Signed)
Doubtful bed bug bites, ok for benadryl cream otc prn itching

## 2015-11-19 NOTE — Assessment & Plan Note (Signed)

## 2015-11-19 NOTE — Assessment & Plan Note (Signed)
Lab Results  Component Value Date   TSH 15.05 (H) 11/08/2015   Uncontrolled, for increased synthroid toi 100 mcg qd, f/u labs at 4 wks

## 2015-11-24 DIAGNOSIS — Z124 Encounter for screening for malignant neoplasm of cervix: Secondary | ICD-10-CM | POA: Diagnosis not present

## 2015-11-24 DIAGNOSIS — Z1231 Encounter for screening mammogram for malignant neoplasm of breast: Secondary | ICD-10-CM | POA: Diagnosis not present

## 2015-11-24 DIAGNOSIS — Z6822 Body mass index (BMI) 22.0-22.9, adult: Secondary | ICD-10-CM | POA: Diagnosis not present

## 2015-11-24 DIAGNOSIS — Z01419 Encounter for gynecological examination (general) (routine) without abnormal findings: Secondary | ICD-10-CM | POA: Diagnosis not present

## 2015-11-29 ENCOUNTER — Other Ambulatory Visit: Payer: Self-pay | Admitting: Obstetrics and Gynecology

## 2015-11-29 DIAGNOSIS — R928 Other abnormal and inconclusive findings on diagnostic imaging of breast: Secondary | ICD-10-CM
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
## 2015-11-30 ENCOUNTER — Ambulatory Visit (INDEPENDENT_AMBULATORY_CARE_PROVIDER_SITE_OTHER): Payer: Commercial Managed Care - HMO | Admitting: Family Medicine

## 2015-11-30 ENCOUNTER — Telehealth: Payer: Self-pay

## 2015-11-30 ENCOUNTER — Encounter: Payer: Self-pay | Admitting: Family Medicine

## 2015-11-30 DIAGNOSIS — R1032 Left lower quadrant pain: Secondary | ICD-10-CM

## 2015-11-30 DIAGNOSIS — M810 Age-related osteoporosis without current pathological fracture: Secondary | ICD-10-CM

## 2015-11-30 MED ORDER — VITAMIN D (ERGOCALCIFEROL) 1.25 MG (50000 UNIT) PO CAPS: 50000.0000 [IU] | ORAL_CAPSULE | ORAL | 0 refills | Status: DC

## 2015-11-30 MED ORDER — GABAPENTIN 100 MG PO CAPS: 100.0000 mg | ORAL_CAPSULE | Freq: Every day | ORAL | 3 refills | Status: DC

## 2015-11-30 NOTE — Telephone Encounter (Signed)
Spoke with patient in regards to taking Vitamin D. Per Dr. Tamala Julian, he would like to have her taking the supplement every 2-3 weeks instead of every week. Patient voiced understanding.
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
## 2015-11-30 NOTE — Patient Instructions (Signed)
Good to see you.  Ice 20 minutes 2 times daily. Usually after activity and before bed. Exercises 3 times a week.  Once weekly vitamin D for at least the next 12 weeks if not forever   Gabapentin 100mg  at night can help with sleep and nerve pain  Consider B6 200mg  daily and Turmeric 500mg  daily  Arnica lotion can help a little with the back discomfort See me again in 3 weeks to make sure we are making progress.

## 2015-12-06 ENCOUNTER — Ambulatory Visit
Admission: RE | Admit: 2015-12-06 | Discharge: 2015-12-06 | Disposition: A | Discharge status: Home / Self Care | Payer: Commercial Managed Care - HMO | Source: Ambulatory Visit | Attending: Obstetrics and Gynecology | Admitting: Obstetrics and Gynecology

## 2015-12-06 DIAGNOSIS — R928 Other abnormal and inconclusive findings on diagnostic imaging of breast: Secondary | ICD-10-CM | POA: Diagnosis not present

## 2015-12-06 DIAGNOSIS — N6310 Unspecified lump in the right breast, unspecified quadrant: Secondary | ICD-10-CM | POA: Diagnosis not present

## 2015-12-21 ENCOUNTER — Encounter: Payer: Self-pay | Admitting: Family Medicine

## 2015-12-21 ENCOUNTER — Ambulatory Visit (INDEPENDENT_AMBULATORY_CARE_PROVIDER_SITE_OTHER): Payer: Commercial Managed Care - HMO | Admitting: Family Medicine

## 2015-12-21 ENCOUNTER — Other Ambulatory Visit (INDEPENDENT_AMBULATORY_CARE_PROVIDER_SITE_OTHER): Payer: Commercial Managed Care - HMO

## 2015-12-21 DIAGNOSIS — E039 Hypothyroidism, unspecified: Secondary | ICD-10-CM

## 2015-12-21 DIAGNOSIS — R1032 Left lower quadrant pain: Secondary | ICD-10-CM

## 2015-12-21 DIAGNOSIS — E785 Hyperlipidemia, unspecified: Secondary | ICD-10-CM | POA: Diagnosis not present

## 2015-12-21 LAB — TSH: TSH: 3.89 u[IU]/mL (ref 0.35–4.50)

## 2015-12-21 LAB — LIPID PANEL
CHOL/HDL RATIO: 2.8 ratio (ref <5.0)
Cholesterol: 160 mg/dL (ref <200)
HDL: 57 mg/dL (ref >50)
LDL Cholesterol: 69 mg/dL (ref <100)
Triglycerides: 168 mg/dL — ABNORMAL HIGH (ref <150)
VLDL: 34 mg/dL — AB (ref <30)

## 2015-12-21 LAB — T4, FREE: FREE T4: 1.03 ng/dL (ref 0.60–1.60)

## 2015-12-21 LAB — HEPATIC FUNCTION PANEL
ALT: 13 U/L (ref 6–29)
AST: 17 U/L (ref 10–35)
Albumin: 4 g/dL (ref 3.6–5.1)
Alkaline Phosphatase: 41 U/L (ref 33–130)
BILIRUBIN DIRECT: 0.1 mg/dL (ref <=0.2)
BILIRUBIN INDIRECT: 0.4 mg/dL (ref 0.2–1.2)
BILIRUBIN TOTAL: 0.5 mg/dL (ref 0.2–1.2)
Total Protein: 6.5 g/dL (ref 6.1–8.1)
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
## 2015-12-21 NOTE — Assessment & Plan Note (Signed)
Patient does have more of a groin pain there is a chance that there is still a stress reaction but seems to be healing. I believe the patient will do well with conservative therapy. Patient knows if any worsening symptoms come back. Patient will finish with the every other week vitamin D given inspection daily. Patient follow-up as needed

## 2015-12-21 NOTE — Patient Instructions (Addendum)
Good to see you  Ice 20 minutes 2 times daily. Usually after activity and before bed. See me when you need me.

## 2016-02-15 ENCOUNTER — Other Ambulatory Visit: Payer: Self-pay | Admitting: Internal Medicine

## 2016-05-21 ENCOUNTER — Other Ambulatory Visit: Payer: Self-pay | Admitting: General Practice

## 2016-05-21 MED ORDER — CYANOCOBALAMIN 1000 MCG/ML IJ SOLN: INTRAMUSCULAR | 0 refills | Status: DC

## 2016-05-22 ENCOUNTER — Telehealth: Payer: Self-pay | Admitting: Internal Medicine

## 2016-05-22 NOTE — Telephone Encounter (Signed)
Called patient to schedule awv. Lvm for patient to call office to schedule appt.  °
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
## 2016-05-30 NOTE — Progress Notes (Signed)
Pre visit review using our clinic review tool, if applicable. No additional management support is needed unless otherwise documented below in the visit note. 

## 2016-05-31 ENCOUNTER — Ambulatory Visit (INDEPENDENT_AMBULATORY_CARE_PROVIDER_SITE_OTHER): Payer: Medicare HMO | Admitting: *Deleted

## 2016-05-31 VITALS — BP 128/80 | HR 67 | Resp 20 | Ht 66.0 in | Wt 138.0 lb

## 2016-05-31 DIAGNOSIS — Z0000 Encounter for general adult medical examination without abnormal findings: Secondary | ICD-10-CM

## 2016-05-31 NOTE — Patient Instructions (Addendum)
Continue to eat heart healthy diet (full of fruits, vegetables, whole grains, lean protein, water--limit salt, fat, and sugar intake) and increase physical activity as tolerated.  Continue doing brain stimulating activities (puzzles, reading, adult coloring books, staying active) to keep memory sharp.    Sheena Craig , Thank you for taking time to come for your Medicare Wellness Visit. I appreciate your ongoing commitment to your health goals. Please review the following plan we discussed and let me know if I can assist you in the future.   These are the goals we discussed: Goals    . Exercise to keep my heart rate up for 20 minutes          Play music and be active for 20 minutes while listening. Start to build personal friendships. Start to invite people out more frequently.       This is a list of the screening recommended for you and due dates:  Health Maintenance  Topic Date Due  . Flu Shot  08/14/2016  . Mammogram  10/13/2016  . Tetanus Vaccine  09/23/2018  . Colon Cancer Screening  08/18/2021  . DEXA scan (bone density measurement)  Completed  . Pneumonia vaccines  Completed    Gastroesophageal Reflux Disease, Adult Normally, food travels down the esophagus and stays in the stomach to be digested. If a person has gastroesophageal reflux disease (GERD), food and stomach acid move back up into the esophagus. When this happens, the esophagus becomes sore and swollen (inflamed). Over time, GERD can make small holes (ulcers) in the lining of the esophagus. Follow these instructions at home: Diet   Follow a diet as told by your doctor. You may need to avoid foods and drinks such as:  Coffee and tea (with or without caffeine).  Drinks that contain alcohol.  Energy drinks and sports drinks.  Carbonated drinks or sodas.  Chocolate and cocoa.  Peppermint and mint flavorings.  Garlic and onions.  Horseradish.  Spicy and acidic foods, such as peppers, chili powder,  curry powder, vinegar, hot sauces, and BBQ sauce.  Citrus fruit juices and citrus fruits, such as oranges, lemons, and limes.  Tomato-based foods, such as red sauce, chili, salsa, and pizza with red sauce.  Fried and fatty foods, such as donuts, french fries, potato chips, and high-fat dressings.  High-fat meats, such as hot dogs, rib eye steak, sausage, ham, and bacon.  High-fat dairy items, such as whole milk, butter, and cream cheese.  Eat small meals often. Avoid eating large meals.  Avoid drinking large amounts of liquid with your meals.  Avoid eating meals during the 2-3 hours before bedtime.  Avoid lying down right after you eat.  Do not exercise right after you eat. General instructions   Pay attention to any changes in your symptoms.  Take over-the-counter and prescription medicines only as told by your doctor. Do not take aspirin, ibuprofen, or other NSAIDs unless your doctor says it is okay.  Do not use any tobacco products, including cigarettes, chewing tobacco, and e-cigarettes. If you need help quitting, ask your doctor.  Wear loose clothes. Do not wear anything tight around your waist.  Raise (elevate) the head of your bed about 6 inches (15 cm).  Try to lower your stress. If you need help doing this, ask your doctor.  If you are overweight, lose an amount of weight that is healthy for you. Ask your doctor about a safe weight loss goal.  Keep all follow-up visits as told by  your doctor. This is important. Contact a doctor if:  You have new symptoms.  You lose weight and you do not know why it is happening.  You have trouble swallowing, or it hurts to swallow.  You have wheezing or a cough that keeps happening.  Your symptoms do not get better with treatment.  You have a hoarse voice. Get help right away if:  You have pain in your arms, neck, jaw, teeth, or back.  You feel sweaty, dizzy, or light-headed.  You have chest pain or shortness of  breath.  You throw up (vomit) and your throw up looks like blood or coffee grounds.  You pass out (faint).  Your poop (stool) is bloody or black.  You cannot swallow, drink, or eat. This information is not intended to replace advice given to you by your health care provider. Make sure you discuss any questions you have with your health care provider. Document Released: 06/19/2007 Document Revised: 06/08/2015 Document Reviewed: 04/27/2014 Elsevier Interactive Patient Education  2017 [employer].

## 2016-06-07 ENCOUNTER — Encounter: Payer: Self-pay | Admitting: Internal Medicine

## 2016-06-26 ENCOUNTER — Other Ambulatory Visit: Payer: Self-pay | Admitting: Obstetrics and Gynecology

## 2016-06-26 DIAGNOSIS — N63 Unspecified lump in unspecified breast: Secondary | ICD-10-CM

## 2016-07-12 ENCOUNTER — Ambulatory Visit
Admission: RE | Admit: 2016-07-12 | Discharge: 2016-07-12 | Disposition: A | Discharge status: Home / Self Care | Payer: Medicare HMO | Source: Ambulatory Visit | Attending: Obstetrics and Gynecology | Admitting: Obstetrics and Gynecology

## 2016-07-12 DIAGNOSIS — R928 Other abnormal and inconclusive findings on diagnostic imaging of breast: Secondary | ICD-10-CM | POA: Diagnosis not present

## 2016-07-12 DIAGNOSIS — N63 Unspecified lump in unspecified breast: Secondary | ICD-10-CM

## 2016-07-15 ENCOUNTER — Other Ambulatory Visit: Payer: Medicare HMO

## 2016-07-24 DIAGNOSIS — L239 Allergic contact dermatitis, unspecified cause: Secondary | ICD-10-CM | POA: Diagnosis not present

## 2016-08-19 ENCOUNTER — Other Ambulatory Visit: Payer: Self-pay | Admitting: Internal Medicine

## 2016-08-29 ENCOUNTER — Other Ambulatory Visit: Payer: Self-pay | Admitting: Cardiovascular Disease

## 2016-08-29 MED ORDER — ROSUVASTATIN CALCIUM 10 MG PO TABS: 10.0000 mg | ORAL_TABLET | Freq: Every day | ORAL | 0 refills | Status: DC

## 2016-10-04 ENCOUNTER — Other Ambulatory Visit: Payer: Self-pay | Admitting: Cardiovascular Disease

## 2016-10-21 ENCOUNTER — Other Ambulatory Visit: Payer: Self-pay | Admitting: Obstetrics and Gynecology

## 2016-10-21 DIAGNOSIS — N63 Unspecified lump in unspecified breast: Secondary | ICD-10-CM

## 2016-10-24 ENCOUNTER — Other Ambulatory Visit: Payer: Self-pay | Admitting: Internal Medicine

## 2016-11-14 ENCOUNTER — Telehealth: Payer: Self-pay

## 2016-11-14 ENCOUNTER — Other Ambulatory Visit (INDEPENDENT_AMBULATORY_CARE_PROVIDER_SITE_OTHER): Payer: Medicare HMO

## 2016-11-14 DIAGNOSIS — Z0000 Encounter for general adult medical examination without abnormal findings: Secondary | ICD-10-CM | POA: Diagnosis not present

## 2016-11-14 LAB — CBC WITH DIFFERENTIAL/PLATELET
BASOS PCT: 1 % (ref 0.0–3.0)
Basophils Absolute: 0.1 10*3/uL (ref 0.0–0.1)
EOS ABS: 0.2 10*3/uL (ref 0.0–0.7)
Eosinophils Relative: 4.2 % (ref 0.0–5.0)
HCT: 40.9 % (ref 36.0–46.0)
HEMOGLOBIN: 13.7 g/dL (ref 12.0–15.0)
Lymphocytes Relative: 28.3 % (ref 12.0–46.0)
Lymphs Abs: 1.6 10*3/uL (ref 0.7–4.0)
MCHC: 33.4 g/dL (ref 30.0–36.0)
MCV: 93.2 fl (ref 78.0–100.0)
MONO ABS: 0.6 10*3/uL (ref 0.1–1.0)
Monocytes Relative: 11.2 % (ref 3.0–12.0)
NEUTROS PCT: 55.3 % (ref 43.0–77.0)
Neutro Abs: 3 10*3/uL (ref 1.4–7.7)
PLATELETS: 339 10*3/uL (ref 150.0–400.0)
RBC: 4.39 Mil/uL (ref 3.87–5.11)
RDW: 13.3 % (ref 11.5–15.5)
WBC: 5.5 10*3/uL (ref 4.0–10.5)

## 2016-11-14 LAB — URINALYSIS, ROUTINE W REFLEX MICROSCOPIC
Bilirubin Urine: NEGATIVE
Ketones, ur: NEGATIVE
Nitrite: NEGATIVE
Specific Gravity, Urine: 1.015 (ref 1.000–1.030)
Total Protein, Urine: NEGATIVE
Urine Glucose: NEGATIVE
Urobilinogen, UA: 0.2 (ref 0.0–1.0)
pH: 7 (ref 5.0–8.0)

## 2016-11-14 LAB — LIPID PANEL
CHOL/HDL RATIO: 3
Cholesterol: 155 mg/dL (ref 0–200)
HDL: 56 mg/dL (ref >39.00)
LDL CALC: 82 mg/dL (ref 0–99)
NonHDL: 98.54
TRIGLYCERIDES: 84 mg/dL (ref 0.0–149.0)
VLDL: 16.8 mg/dL (ref 0.0–40.0)

## 2016-11-14 LAB — HEPATIC FUNCTION PANEL
ALT: 14 U/L (ref 0–35)
AST: 17 U/L (ref 0–37)
Albumin: 4 g/dL (ref 3.5–5.2)
Alkaline Phosphatase: 36 U/L — ABNORMAL LOW (ref 39–117)
BILIRUBIN DIRECT: 0.1 mg/dL (ref 0.0–0.3)
BILIRUBIN TOTAL: 0.5 mg/dL (ref 0.2–1.2)
Total Protein: 6.9 g/dL (ref 6.0–8.3)

## 2016-11-14 LAB — BASIC METABOLIC PANEL
BUN: 16 mg/dL (ref 6–23)
CHLORIDE: 105 meq/L (ref 96–112)
CO2: 30 mEq/L (ref 19–32)
CREATININE: 0.71 mg/dL (ref 0.40–1.20)
Calcium: 9.7 mg/dL (ref 8.4–10.5)
GFR: 85.28 mL/min (ref >60.00)
Glucose, Bld: 79 mg/dL (ref 70–99)
Potassium: 4.7 mEq/L (ref 3.5–5.1)
Sodium: 140 mEq/L (ref 135–145)

## 2016-11-14 LAB — TSH: TSH: 2.22 u[IU]/mL (ref 0.35–4.50)

## 2016-11-14 NOTE — Telephone Encounter (Signed)
Lab called needing orders entered for upcoming CPE

## 2016-11-18 ENCOUNTER — Ambulatory Visit (INDEPENDENT_AMBULATORY_CARE_PROVIDER_SITE_OTHER)
Admission: RE | Admit: 2016-11-18 | Discharge: 2016-11-18 | Disposition: A | Discharge status: Home / Self Care | Payer: Medicare HMO | Source: Ambulatory Visit | Attending: Internal Medicine | Admitting: Internal Medicine

## 2016-11-18 ENCOUNTER — Telehealth: Payer: Self-pay

## 2016-11-18 DIAGNOSIS — M810 Age-related osteoporosis without current pathological fracture: Secondary | ICD-10-CM

## 2016-11-18 NOTE — Telephone Encounter (Signed)
Xray called needing a new order put in

## 2016-11-20 ENCOUNTER — Encounter: Payer: Self-pay | Admitting: Internal Medicine

## 2016-11-20 ENCOUNTER — Ambulatory Visit: Payer: Medicare HMO | Admitting: Internal Medicine

## 2016-11-20 VITALS — BP 120/78 | HR 75 | Temp 97.7°F | Ht 66.0 in | Wt 134.0 lb

## 2016-11-20 DIAGNOSIS — Z0000 Encounter for general adult medical examination without abnormal findings: Secondary | ICD-10-CM | POA: Diagnosis not present

## 2016-11-20 MED ORDER — ZOSTER VAC RECOMB ADJUVANTED 50 MCG/0.5ML IM SUSR: 0.5000 mL | Freq: Once | INTRAMUSCULAR | 1 refills | Status: AC

## 2016-11-20 MED ORDER — FLUOXETINE HCL 10 MG PO CAPS: 10.0000 mg | ORAL_CAPSULE | Freq: Every day | ORAL | 3 refills | Status: DC

## 2016-11-20 MED ORDER — LEVOTHYROXINE SODIUM 100 MCG PO TABS: 100.0000 ug | ORAL_TABLET | Freq: Every day | ORAL | 3 refills | Status: DC

## 2016-11-20 MED ORDER — PANTOPRAZOLE SODIUM 40 MG PO TBEC: 40.0000 mg | DELAYED_RELEASE_TABLET | Freq: Every day | ORAL | 3 refills | Status: DC

## 2016-11-20 NOTE — Patient Instructions (Signed)
Please continue all other medications as before, and refills have been done if requested.  Please have the pharmacy call with any other refills you may need.  Please continue your efforts at being more active, low cholesterol diet, and weight control.  You are otherwise up to date with prevention measures today.  Please keep your appointments with your specialists as you may have planned  Please return in 1 year for your yearly visit, or sooner if needed, with Lab testing done 3-5 days before  
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
## 2016-11-23 ENCOUNTER — Other Ambulatory Visit: Payer: Self-pay | Admitting: Internal Medicine

## 2016-11-23 NOTE — Assessment & Plan Note (Signed)

## 2016-11-28 ENCOUNTER — Ambulatory Visit
Admission: RE | Admit: 2016-11-28 | Discharge: 2016-11-28 | Disposition: A | Discharge status: Home / Self Care | Payer: Medicare HMO | Source: Ambulatory Visit | Attending: Obstetrics and Gynecology | Admitting: Obstetrics and Gynecology

## 2016-11-28 ENCOUNTER — Other Ambulatory Visit: Payer: Self-pay | Admitting: Obstetrics and Gynecology

## 2016-11-28 DIAGNOSIS — N63 Unspecified lump in unspecified breast: Secondary | ICD-10-CM

## 2016-11-28 DIAGNOSIS — R928 Other abnormal and inconclusive findings on diagnostic imaging of breast: Secondary | ICD-10-CM | POA: Diagnosis not present

## 2017-01-19 ENCOUNTER — Other Ambulatory Visit: Payer: Self-pay | Admitting: Internal Medicine

## 2017-02-08 ENCOUNTER — Other Ambulatory Visit: Payer: Self-pay | Admitting: Cardiovascular Disease

## 2017-02-24 ENCOUNTER — Encounter: Payer: Self-pay | Admitting: Internal Medicine

## 2017-02-24 MED ORDER — ROSUVASTATIN CALCIUM 10 MG PO TABS: 10.0000 mg | ORAL_TABLET | Freq: Every day | ORAL | 2 refills | Status: DC

## 2017-03-12 ENCOUNTER — Other Ambulatory Visit: Payer: Self-pay | Admitting: Internal Medicine

## 2017-04-08 DIAGNOSIS — S32111D Minimally displaced Zone I fracture of sacrum, subsequent encounter for fracture with routine healing: Secondary | ICD-10-CM | POA: Diagnosis not present

## 2017-04-08 DIAGNOSIS — S32810D Multiple fractures of pelvis with stable disruption of pelvic ring, subsequent encounter for fracture with routine healing: Secondary | ICD-10-CM | POA: Diagnosis not present

## 2017-05-23 DIAGNOSIS — H18452 Nodular corneal degeneration, left eye: Secondary | ICD-10-CM | POA: Diagnosis not present

## 2017-05-23 DIAGNOSIS — H16223 Keratoconjunctivitis sicca, not specified as Sjogren's, bilateral: Secondary | ICD-10-CM | POA: Diagnosis not present

## 2017-05-23 DIAGNOSIS — H04123 Dry eye syndrome of bilateral lacrimal glands: Secondary | ICD-10-CM | POA: Diagnosis not present

## 2017-05-23 DIAGNOSIS — H35363 Drusen (degenerative) of macula, bilateral: Secondary | ICD-10-CM | POA: Diagnosis not present

## 2017-05-23 DIAGNOSIS — H0100A Unspecified blepharitis right eye, upper and lower eyelids: Secondary | ICD-10-CM | POA: Diagnosis not present

## 2017-05-23 DIAGNOSIS — H0100B Unspecified blepharitis left eye, upper and lower eyelids: Secondary | ICD-10-CM | POA: Diagnosis not present

## 2017-05-23 DIAGNOSIS — Z961 Presence of intraocular lens: Secondary | ICD-10-CM | POA: Diagnosis not present

## 2017-06-02 ENCOUNTER — Other Ambulatory Visit: Payer: Self-pay | Admitting: Internal Medicine

## 2017-06-06 ENCOUNTER — Ambulatory Visit: Payer: Medicare HMO

## 2017-06-26 ENCOUNTER — Ambulatory Visit: Payer: Medicare HMO

## 2017-07-03 ENCOUNTER — Ambulatory Visit (INDEPENDENT_AMBULATORY_CARE_PROVIDER_SITE_OTHER): Payer: Medicare HMO | Admitting: *Deleted

## 2017-07-03 VITALS — BP 122/58 | HR 77 | Resp 19 | Ht 65.0 in | Wt 134.0 lb

## 2017-07-03 DIAGNOSIS — Z0000 Encounter for general adult medical examination without abnormal findings: Secondary | ICD-10-CM

## 2017-07-03 NOTE — Patient Instructions (Addendum)
Continue doing brain stimulating activities (puzzles, reading, adult coloring books, staying active) to keep memory sharp.   Continue to eat heart healthy diet (full of fruits, vegetables, whole grains, lean protein, water--limit salt, fat, and sugar intake) and increase physical activity as tolerated.   Sheena Craig , Thank you for taking time to come for your Medicare Wellness Visit. I appreciate your ongoing commitment to your health goals. Please review the following plan we discussed and let me know if I can assist you in the future.   These are the goals we discussed: Goals    . Patient Stated     Continue to exercise be active, hike, enjoy watching birds, being outside, and brighten my day with flowers.        This is a list of the screening recommended for you and due dates:  Health Maintenance  Topic Date Due  . Flu Shot  08/14/2017  . Tetanus Vaccine  09/23/2018  . Colon Cancer Screening  08/18/2021  . DEXA scan (bone density measurement)  Completed  . Pneumonia vaccines  Completed
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
## 2017-09-01 ENCOUNTER — Other Ambulatory Visit: Payer: Self-pay | Admitting: Internal Medicine

## 2017-09-03 ENCOUNTER — Ambulatory Visit (INDEPENDENT_AMBULATORY_CARE_PROVIDER_SITE_OTHER): Payer: Medicare HMO | Admitting: Internal Medicine

## 2017-09-03 ENCOUNTER — Encounter: Payer: Self-pay | Admitting: Internal Medicine

## 2017-09-03 ENCOUNTER — Ambulatory Visit (INDEPENDENT_AMBULATORY_CARE_PROVIDER_SITE_OTHER)
Admission: RE | Admit: 2017-09-03 | Discharge: 2017-09-03 | Disposition: A | Discharge status: Home / Self Care | Payer: Medicare HMO | Source: Ambulatory Visit | Attending: Internal Medicine | Admitting: Internal Medicine

## 2017-09-03 VITALS — BP 122/72 | HR 67 | Temp 97.7°F | Ht 65.0 in | Wt 135.0 lb

## 2017-09-03 DIAGNOSIS — R05 Cough: Secondary | ICD-10-CM | POA: Diagnosis not present

## 2017-09-03 DIAGNOSIS — R059 Cough, unspecified: Secondary | ICD-10-CM

## 2017-09-03 MED ORDER — HYDROCODONE-HOMATROPINE 5-1.5 MG/5ML PO SYRP: 5.0000 mL | ORAL_SOLUTION | Freq: Four times a day (QID) | ORAL | 0 refills | Status: AC | PRN

## 2017-09-03 NOTE — Patient Instructions (Signed)
Please take all new medication as prescribed - the cough medicine  Please continue all other medications as before, and refills have been done if requested.  Please have the pharmacy call with any other refills you may need.  Please continue your efforts at being more active, low cholesterol diet, and weight control.  Please keep your appointments with your specialists as you may have planned  Please go to the XRAY Department in the Basement (go straight as you get off the elevator) for the x-ray testing  You will be contacted by phone if any changes need to be made immediately.  Otherwise, you will receive a letter about your results with an explanation, but please check with MyChart first.  Please remember to sign up for MyChart if you have not done so, as this will be important to you in the future with finding out test results, communicating by private email, and scheduling acute appointments online when needed.
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
## 2017-09-03 NOTE — Assessment & Plan Note (Signed)
With hx of bronchiectasis, concerned about pna, ok for cxr, cough med prn,  to f/u any worsening symptoms or concerns

## 2017-09-24 ENCOUNTER — Encounter: Payer: Self-pay | Admitting: Internal Medicine

## 2017-11-16 ENCOUNTER — Encounter: Payer: Self-pay | Admitting: Internal Medicine

## 2017-11-21 ENCOUNTER — Other Ambulatory Visit (INDEPENDENT_AMBULATORY_CARE_PROVIDER_SITE_OTHER): Payer: Medicare HMO

## 2017-11-21 DIAGNOSIS — Z0000 Encounter for general adult medical examination without abnormal findings: Secondary | ICD-10-CM

## 2017-11-21 LAB — CBC WITH DIFFERENTIAL/PLATELET
BASOS PCT: 0.7 % (ref 0.0–3.0)
Basophils Absolute: 0 10*3/uL (ref 0.0–0.1)
EOS PCT: 3.7 % (ref 0.0–5.0)
Eosinophils Absolute: 0.2 10*3/uL (ref 0.0–0.7)
HCT: 39.7 % (ref 36.0–46.0)
Hemoglobin: 13.4 g/dL (ref 12.0–15.0)
LYMPHS ABS: 1.6 10*3/uL (ref 0.7–4.0)
Lymphocytes Relative: 26 % (ref 12.0–46.0)
MCHC: 33.7 g/dL (ref 30.0–36.0)
MCV: 90.9 fl (ref 78.0–100.0)
MONO ABS: 0.6 10*3/uL (ref 0.1–1.0)
Monocytes Relative: 10.1 % (ref 3.0–12.0)
NEUTROS PCT: 59.5 % (ref 43.0–77.0)
Neutro Abs: 3.7 10*3/uL (ref 1.4–7.7)
Platelets: 367 10*3/uL (ref 150.0–400.0)
RBC: 4.36 Mil/uL (ref 3.87–5.11)
RDW: 13.9 % (ref 11.5–15.5)
WBC: 6.1 10*3/uL (ref 4.0–10.5)

## 2017-11-21 LAB — URINALYSIS, ROUTINE W REFLEX MICROSCOPIC
Bilirubin Urine: NEGATIVE
Hgb urine dipstick: NEGATIVE
KETONES UR: NEGATIVE
NITRITE: NEGATIVE
PH: 8 (ref 5.0–8.0)
SPECIFIC GRAVITY, URINE: 1.015 (ref 1.000–1.030)
TOTAL PROTEIN, URINE-UPE24: NEGATIVE
URINE GLUCOSE: NEGATIVE
UROBILINOGEN UA: 0.2 (ref 0.0–1.0)

## 2017-11-21 LAB — HEPATIC FUNCTION PANEL
ALBUMIN: 4.2 g/dL (ref 3.5–5.2)
ALT: 13 U/L (ref 0–35)
AST: 16 U/L (ref 0–37)
Alkaline Phosphatase: 48 U/L (ref 39–117)
BILIRUBIN TOTAL: 0.5 mg/dL (ref 0.2–1.2)
Bilirubin, Direct: 0.1 mg/dL (ref 0.0–0.3)
Total Protein: 7 g/dL (ref 6.0–8.3)

## 2017-11-21 LAB — LIPID PANEL
CHOL/HDL RATIO: 3
Cholesterol: 170 mg/dL (ref 0–200)
HDL: 59.9 mg/dL (ref >39.00)
LDL Cholesterol: 89 mg/dL (ref 0–99)
NONHDL: 110.05
TRIGLYCERIDES: 106 mg/dL (ref 0.0–149.0)
VLDL: 21.2 mg/dL (ref 0.0–40.0)

## 2017-11-21 LAB — BASIC METABOLIC PANEL
BUN: 14 mg/dL (ref 6–23)
CALCIUM: 9.6 mg/dL (ref 8.4–10.5)
CO2: 28 meq/L (ref 19–32)
CREATININE: 0.81 mg/dL (ref 0.40–1.20)
Chloride: 106 mEq/L (ref 96–112)
GFR: 73.05 mL/min (ref >60.00)
GLUCOSE: 84 mg/dL (ref 70–99)
Potassium: 4.2 mEq/L (ref 3.5–5.1)
Sodium: 141 mEq/L (ref 135–145)

## 2017-11-21 LAB — TSH: TSH: 8.7 u[IU]/mL — AB (ref 0.35–4.50)

## 2017-11-27 ENCOUNTER — Ambulatory Visit (INDEPENDENT_AMBULATORY_CARE_PROVIDER_SITE_OTHER): Payer: Medicare HMO | Admitting: Internal Medicine

## 2017-11-27 ENCOUNTER — Encounter: Payer: Self-pay | Admitting: Internal Medicine

## 2017-11-27 DIAGNOSIS — Z0000 Encounter for general adult medical examination without abnormal findings: Secondary | ICD-10-CM

## 2017-11-27 DIAGNOSIS — E039 Hypothyroidism, unspecified: Secondary | ICD-10-CM | POA: Diagnosis not present

## 2017-11-27 MED ORDER — LEVOTHYROXINE SODIUM 112 MCG PO TABS: 112.0000 ug | ORAL_TABLET | Freq: Every day | ORAL | 3 refills | Status: DC

## 2017-11-27 MED ORDER — LEVOTHYROXINE SODIUM 100 MCG PO TABS: 100.0000 ug | ORAL_TABLET | Freq: Every day | ORAL | 3 refills | Status: DC

## 2017-11-27 NOTE — Assessment & Plan Note (Signed)
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
## 2017-11-27 NOTE — Patient Instructions (Signed)
Please continue all other medications as before, and refills have been done if requested.  Please have the pharmacy call with any other refills you may need.  Please continue your efforts at being more active, low cholesterol diet, and weight control.  You are otherwise up to date with prevention measures today.  Please keep your appointments with your specialists as you may have planned  Please return in 1 year for your yearly visit, or sooner if needed, with Lab testing done 3-5 days before  

## 2017-11-27 NOTE — Assessment & Plan Note (Signed)
[place] for increase to 112 mcg per day,  to f/u any worsening symptoms or concerns

## 2017-12-16 ENCOUNTER — Other Ambulatory Visit: Payer: Self-pay | Admitting: Internal Medicine

## 2018-01-29 DIAGNOSIS — Z124 Encounter for screening for malignant neoplasm of cervix: Secondary | ICD-10-CM | POA: Diagnosis not present

## 2018-01-29 DIAGNOSIS — Z6821 Body mass index (BMI) 21.0-21.9, adult: Secondary | ICD-10-CM | POA: Diagnosis not present

## 2018-01-29 DIAGNOSIS — Z1231 Encounter for screening mammogram for malignant neoplasm of breast: Secondary | ICD-10-CM | POA: Diagnosis not present

## 2018-01-29 DIAGNOSIS — Z01419 Encounter for gynecological examination (general) (routine) without abnormal findings: Secondary | ICD-10-CM | POA: Diagnosis not present

## 2018-01-29 LAB — HM PAP SMEAR: HM Pap smear: NEGATIVE

## 2018-02-20 ENCOUNTER — Encounter: Payer: Self-pay | Admitting: Internal Medicine

## 2018-02-20 DIAGNOSIS — E039 Hypothyroidism, unspecified: Secondary | ICD-10-CM

## 2018-03-14 ENCOUNTER — Other Ambulatory Visit: Payer: Self-pay | Admitting: Internal Medicine

## 2018-04-29 ENCOUNTER — Encounter: Payer: Self-pay | Admitting: Internal Medicine

## 2018-05-28 DIAGNOSIS — H52223 Regular astigmatism, bilateral: Secondary | ICD-10-CM | POA: Diagnosis not present

## 2018-05-28 DIAGNOSIS — H5203 Hypermetropia, bilateral: Secondary | ICD-10-CM | POA: Diagnosis not present

## 2018-05-28 DIAGNOSIS — H18452 Nodular corneal degeneration, left eye: Secondary | ICD-10-CM | POA: Diagnosis not present

## 2018-05-28 DIAGNOSIS — H35363 Drusen (degenerative) of macula, bilateral: Secondary | ICD-10-CM | POA: Diagnosis not present

## 2018-05-28 DIAGNOSIS — H0100B Unspecified blepharitis left eye, upper and lower eyelids: Secondary | ICD-10-CM | POA: Diagnosis not present

## 2018-05-28 DIAGNOSIS — H16223 Keratoconjunctivitis sicca, not specified as Sjogren's, bilateral: Secondary | ICD-10-CM | POA: Diagnosis not present

## 2018-05-28 DIAGNOSIS — Z961 Presence of intraocular lens: Secondary | ICD-10-CM | POA: Diagnosis not present

## 2018-05-28 DIAGNOSIS — H0100A Unspecified blepharitis right eye, upper and lower eyelids: Secondary | ICD-10-CM | POA: Diagnosis not present

## 2018-06-01 ENCOUNTER — Encounter: Payer: Self-pay | Admitting: Internal Medicine

## 2018-06-01 ENCOUNTER — Other Ambulatory Visit (INDEPENDENT_AMBULATORY_CARE_PROVIDER_SITE_OTHER): Payer: Medicare HMO

## 2018-06-01 DIAGNOSIS — E039 Hypothyroidism, unspecified: Secondary | ICD-10-CM | POA: Diagnosis not present

## 2018-06-01 LAB — T4, FREE: Free T4: 1.92 ng/dL — ABNORMAL HIGH (ref 0.60–1.60)

## 2018-06-02 MED ORDER — LEVOTHYROXINE SODIUM 100 MCG PO TABS: 100.0000 ug | ORAL_TABLET | Freq: Every day | ORAL | 3 refills | Status: DC

## 2018-06-07 ENCOUNTER — Other Ambulatory Visit: Payer: Self-pay | Admitting: Internal Medicine

## 2018-07-06 ENCOUNTER — Encounter: Payer: Self-pay | Admitting: Internal Medicine

## 2018-07-07 NOTE — Telephone Encounter (Signed)
Ok for shirron to contact pt for details - how many shots, what dates and update the immunizations, thanks

## 2018-08-20 ENCOUNTER — Encounter: Payer: Self-pay | Admitting: Internal Medicine

## 2018-08-21 MED ORDER — ROSUVASTATIN CALCIUM 10 MG PO TABS: 10.0000 mg | ORAL_TABLET | Freq: Every day | ORAL | 2 refills | Status: DC

## 2018-09-06 ENCOUNTER — Encounter: Payer: Self-pay | Admitting: Internal Medicine
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
## 2018-09-10 ENCOUNTER — Ambulatory Visit (INDEPENDENT_AMBULATORY_CARE_PROVIDER_SITE_OTHER): Payer: Medicare HMO | Admitting: *Deleted

## 2018-09-10 DIAGNOSIS — Z0000 Encounter for general adult medical examination without abnormal findings: Secondary | ICD-10-CM | POA: Diagnosis not present

## 2018-09-14 ENCOUNTER — Encounter: Payer: Self-pay | Admitting: Internal Medicine

## 2018-11-09 ENCOUNTER — Other Ambulatory Visit: Payer: Self-pay | Admitting: *Deleted

## 2018-11-09 DIAGNOSIS — Z20822 Contact with and (suspected) exposure to covid-19: Secondary | ICD-10-CM

## 2018-11-09 DIAGNOSIS — Z20828 Contact with and (suspected) exposure to other viral communicable diseases: Secondary | ICD-10-CM | POA: Diagnosis not present

## 2018-11-10 LAB — NOVEL CORONAVIRUS, NAA: SARS-CoV-2, NAA: NOT DETECTED

## 2018-11-13 ENCOUNTER — Other Ambulatory Visit: Payer: Self-pay | Admitting: Internal Medicine

## 2018-11-27 ENCOUNTER — Other Ambulatory Visit (INDEPENDENT_AMBULATORY_CARE_PROVIDER_SITE_OTHER): Payer: Medicare HMO

## 2018-11-27 DIAGNOSIS — Z0000 Encounter for general adult medical examination without abnormal findings: Secondary | ICD-10-CM | POA: Diagnosis not present

## 2018-11-27 LAB — URINALYSIS, ROUTINE W REFLEX MICROSCOPIC
Bilirubin Urine: NEGATIVE
Hgb urine dipstick: NEGATIVE
Ketones, ur: NEGATIVE
Nitrite: NEGATIVE
Specific Gravity, Urine: 1.02 (ref 1.000–1.030)
Total Protein, Urine: NEGATIVE
Urine Glucose: NEGATIVE
Urobilinogen, UA: 0.2 (ref 0.0–1.0)
pH: 8 (ref 5.0–8.0)

## 2018-11-27 LAB — CBC WITH DIFFERENTIAL/PLATELET
Basophils Absolute: 0 10*3/uL (ref 0.0–0.1)
Basophils Relative: 0.9 % (ref 0.0–3.0)
Eosinophils Absolute: 0.1 10*3/uL (ref 0.0–0.7)
Eosinophils Relative: 3 % (ref 0.0–5.0)
HCT: 39.1 % (ref 36.0–46.0)
Hemoglobin: 13.3 g/dL (ref 12.0–15.0)
Lymphocytes Relative: 28.9 % (ref 12.0–46.0)
Lymphs Abs: 1.5 10*3/uL (ref 0.7–4.0)
MCHC: 34 g/dL (ref 30.0–36.0)
MCV: 91.7 fl (ref 78.0–100.0)
Monocytes Absolute: 0.5 10*3/uL (ref 0.1–1.0)
Monocytes Relative: 9.9 % (ref 3.0–12.0)
Neutro Abs: 2.9 10*3/uL (ref 1.4–7.7)
Neutrophils Relative %: 57.3 % (ref 43.0–77.0)
Platelets: 348 10*3/uL (ref 150.0–400.0)
RBC: 4.26 Mil/uL (ref 3.87–5.11)
RDW: 13.7 % (ref 11.5–15.5)
WBC: 5 10*3/uL (ref 4.0–10.5)

## 2018-11-27 LAB — LIPID PANEL
Cholesterol: 189 mg/dL (ref 0–200)
HDL: 61.8 mg/dL (ref >39.00)
LDL Cholesterol: 111 mg/dL — ABNORMAL HIGH (ref 0–99)
NonHDL: 127.46
Total CHOL/HDL Ratio: 3
Triglycerides: 84 mg/dL (ref 0.0–149.0)
VLDL: 16.8 mg/dL (ref 0.0–40.0)

## 2018-11-27 LAB — BASIC METABOLIC PANEL
BUN: 13 mg/dL (ref 6–23)
CO2: 28 mEq/L (ref 19–32)
Calcium: 9.6 mg/dL (ref 8.4–10.5)
Chloride: 104 mEq/L (ref 96–112)
Creatinine, Ser: 0.8 mg/dL (ref 0.40–1.20)
GFR: 69.54 mL/min (ref >60.00)
Glucose, Bld: 88 mg/dL (ref 70–99)
Potassium: 4.1 mEq/L (ref 3.5–5.1)
Sodium: 138 mEq/L (ref 135–145)

## 2018-11-27 LAB — TSH: TSH: 16.58 u[IU]/mL — ABNORMAL HIGH (ref 0.35–4.50)

## 2018-11-27 LAB — HEPATIC FUNCTION PANEL
ALT: 15 U/L (ref 0–35)
AST: 20 U/L (ref 0–37)
Albumin: 4.2 g/dL (ref 3.5–5.2)
Alkaline Phosphatase: 50 U/L (ref 39–117)
Bilirubin, Direct: 0.1 mg/dL (ref 0.0–0.3)
Total Bilirubin: 0.6 mg/dL (ref 0.2–1.2)
Total Protein: 7 g/dL (ref 6.0–8.3)

## 2018-12-04 ENCOUNTER — Ambulatory Visit (INDEPENDENT_AMBULATORY_CARE_PROVIDER_SITE_OTHER): Payer: Medicare HMO | Admitting: Internal Medicine

## 2018-12-04 ENCOUNTER — Encounter: Payer: Self-pay | Admitting: Internal Medicine

## 2018-12-04 ENCOUNTER — Other Ambulatory Visit: Payer: Self-pay

## 2018-12-04 ENCOUNTER — Ambulatory Visit (INDEPENDENT_AMBULATORY_CARE_PROVIDER_SITE_OTHER)
Admission: RE | Admit: 2018-12-04 | Discharge: 2018-12-04 | Disposition: A | Discharge status: Home / Self Care | Payer: Medicare HMO | Source: Ambulatory Visit | Attending: Internal Medicine | Admitting: Internal Medicine

## 2018-12-04 VITALS — BP 124/78 | HR 83 | Temp 97.7°F | Ht 65.0 in | Wt 128.0 lb

## 2018-12-04 DIAGNOSIS — E2839 Other primary ovarian failure: Secondary | ICD-10-CM

## 2018-12-04 DIAGNOSIS — E039 Hypothyroidism, unspecified: Secondary | ICD-10-CM

## 2018-12-04 DIAGNOSIS — I2510 Atherosclerotic heart disease of native coronary artery without angina pectoris: Secondary | ICD-10-CM

## 2018-12-04 DIAGNOSIS — E559 Vitamin D deficiency, unspecified: Secondary | ICD-10-CM

## 2018-12-04 DIAGNOSIS — E538 Deficiency of other specified B group vitamins: Secondary | ICD-10-CM

## 2018-12-04 DIAGNOSIS — Z23 Encounter for immunization: Secondary | ICD-10-CM | POA: Diagnosis not present

## 2018-12-04 DIAGNOSIS — E7849 Other hyperlipidemia: Secondary | ICD-10-CM

## 2018-12-04 DIAGNOSIS — E611 Iron deficiency: Secondary | ICD-10-CM

## 2018-12-04 DIAGNOSIS — Z0001 Encounter for general adult medical examination with abnormal findings: Secondary | ICD-10-CM

## 2018-12-04 DIAGNOSIS — E785 Hyperlipidemia, unspecified: Secondary | ICD-10-CM

## 2018-12-04 DIAGNOSIS — Z0000 Encounter for general adult medical examination without abnormal findings: Secondary | ICD-10-CM

## 2018-12-04 MED ORDER — CYANOCOBALAMIN 1000 MCG/ML IJ SOLN
1000.0000 ug | Freq: Once | INTRAMUSCULAR | Status: AC
  Administered 2018-12-04: 11:00:00 1000 ug via INTRAMUSCULAR

## 2018-12-04 MED ORDER — ASPIRIN 81 MG PO TBEC: 81.0000 mg | DELAYED_RELEASE_TABLET | Freq: Every day | ORAL | 12 refills | Status: DC

## 2018-12-04 MED ORDER — CYANOCOBALAMIN 1000 MCG/ML IJ SOLN: INTRAMUSCULAR | 5 refills | Status: DC
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
## 2018-12-04 NOTE — Patient Instructions (Addendum)
You had the Tdap tetanus shot today  Please schedule the bone density test before leaving today at the scheduling desk (where you check out)  You had the B12 shot today  We have discussed the Cardiac CT Score test to measure the calcification level (if any) in your heart arteries.  This test has been ordered in our [facility] [place], so please call [facility] directly, as they prefer this, at [phone] to be scheduled.  Please start baby aspirin 81 mg - 1 per day  Please continue all other medications as before, including the whole thyroid pill every day as prescribed  Please have the pharmacy call with any other refills you may need.  Please continue your efforts at being more active, low cholesterol diet, and weight control.  You are otherwise up to date with prevention measures today.  Please keep your appointments with your specialists as you may have planned  Please return in 4 weeks for repeat thyroid testing LAB ONLY to make sure we are not overtreating  You will be contacted by phone if any changes need to be made immediately.  Otherwise, you will receive a letter about your results with an explanation, but please check with MyChart first.  Please remember to sign up for MyChart if you have not done so, as this will be important to you in the future with finding out test results, communicating by private email, and scheduling acute appointments online when needed.  Please return in 1 year for your yearly visit, or sooner if needed, with Lab testing done 3-5 days before

## 2018-12-05 ENCOUNTER — Encounter: Payer: Self-pay | Admitting: Internal Medicine

## 2018-12-05 NOTE — Assessment & Plan Note (Signed)
stable overall by history and exam, recent data reviewed with pt, and pt to continue medical treatment as before,  to f/u any worsening symptoms or concerns  

## 2018-12-05 NOTE — Assessment & Plan Note (Signed)
For cardiac ct score 

## 2018-12-05 NOTE — Assessment & Plan Note (Signed)
To consider statin pending card ct score

## 2018-12-05 NOTE — Assessment & Plan Note (Signed)

## 2018-12-24 ENCOUNTER — Ambulatory Visit (INDEPENDENT_AMBULATORY_CARE_PROVIDER_SITE_OTHER)
Admission: RE | Admit: 2018-12-24 | Discharge: 2018-12-24 | Disposition: A | Discharge status: Home / Self Care | Payer: Medicare HMO | Source: Ambulatory Visit | Attending: Internal Medicine | Admitting: Internal Medicine

## 2018-12-24 ENCOUNTER — Other Ambulatory Visit: Payer: Self-pay

## 2018-12-24 DIAGNOSIS — E785 Hyperlipidemia, unspecified: Secondary | ICD-10-CM

## 2018-12-29 ENCOUNTER — Other Ambulatory Visit: Payer: Self-pay | Admitting: Internal Medicine

## 2018-12-29 DIAGNOSIS — R931 Abnormal findings on diagnostic imaging of heart and coronary circulation: Secondary | ICD-10-CM

## 2019-01-12 ENCOUNTER — Telehealth: Payer: Self-pay

## 2019-01-12 NOTE — Telephone Encounter (Signed)
Per fax request faxed and confirmed last note to [facility]

## 2019-01-21 ENCOUNTER — Encounter: Payer: Self-pay | Admitting: Internal Medicine

## 2019-01-21 NOTE — Telephone Encounter (Signed)
Sheena Craig, pt declines prolia, thanks

## 2019-01-23 ENCOUNTER — Encounter: Payer: Self-pay | Admitting: Internal Medicine

## 2019-01-27 NOTE — Telephone Encounter (Signed)
A user error has taken place CLOSING ENCOUNTER

## 2019-01-29 ENCOUNTER — Telehealth: Payer: Self-pay | Admitting: Internal Medicine

## 2019-01-29 MED ORDER — ALENDRONATE SODIUM 70 MG PO TABS: 70.0000 mg | ORAL_TABLET | ORAL | 3 refills | Status: DC

## 2019-01-29 NOTE — Telephone Encounter (Signed)
I received information from insurance that they want her to try fosamax prior to starting prolia  Please ask pt if ok to start fosamax which is once weekly for limit of 5 yrs to stabilize the bone;  The only real side effects can be GI upset (more stomach and esophagus related)  If she is ok with this, I will send to pharmacy

## 2019-01-29 NOTE — Telephone Encounter (Signed)
Ok fosamax done erx - ok to take for one year

## 2019-01-29 NOTE — Telephone Encounter (Signed)
Pt informed of below. She states she has taken Fosamax for the past 4 years. So, she states she can take Fosamax for 1 more year. If you send a new Rx, she requests it go to [facility].

## 2019-01-29 NOTE — Addendum Note (Signed)
Addended by: Biagio Borg on: 01/29/2019 08:50 PM   Modules accepted: Orders

## 2019-02-10 ENCOUNTER — Encounter: Payer: Self-pay | Admitting: Internal Medicine

## 2019-02-10 DIAGNOSIS — R202 Paresthesia of skin: Secondary | ICD-10-CM

## 2019-02-12 ENCOUNTER — Other Ambulatory Visit (INDEPENDENT_AMBULATORY_CARE_PROVIDER_SITE_OTHER): Payer: Medicare HMO

## 2019-02-12 DIAGNOSIS — E559 Vitamin D deficiency, unspecified: Secondary | ICD-10-CM | POA: Diagnosis not present

## 2019-02-12 DIAGNOSIS — E538 Deficiency of other specified B group vitamins: Secondary | ICD-10-CM

## 2019-02-12 DIAGNOSIS — E611 Iron deficiency: Secondary | ICD-10-CM | POA: Diagnosis not present

## 2019-02-12 DIAGNOSIS — E039 Hypothyroidism, unspecified: Secondary | ICD-10-CM

## 2019-02-12 DIAGNOSIS — Z1231 Encounter for screening mammogram for malignant neoplasm of breast: Secondary | ICD-10-CM | POA: Diagnosis not present

## 2019-02-12 LAB — VITAMIN D 25 HYDROXY (VIT D DEFICIENCY, FRACTURES): VITD: 29.45 ng/mL — ABNORMAL LOW (ref 30.00–100.00)

## 2019-02-12 LAB — T4, FREE: Free T4: 1.08 ng/dL (ref 0.60–1.60)

## 2019-02-12 LAB — VITAMIN B12: Vitamin B-12: 391 pg/mL (ref 211–911)

## 2019-02-12 LAB — IBC PANEL
Iron: 102 ug/dL (ref 42–145)
Saturation Ratios: 39.6 % (ref 20.0–50.0)
Transferrin: 184 mg/dL — ABNORMAL LOW (ref 212.0–360.0)

## 2019-02-15 ENCOUNTER — Encounter: Payer: Self-pay | Admitting: Neurology
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
## 2019-02-22 ENCOUNTER — Ambulatory Visit: Payer: Medicare HMO | Admitting: Cardiovascular Disease

## 2019-02-22 ENCOUNTER — Encounter: Payer: Self-pay | Admitting: Cardiovascular Disease

## 2019-02-22 ENCOUNTER — Other Ambulatory Visit: Payer: Self-pay

## 2019-02-22 VITALS — BP 128/80 | HR 65 | Wt 126.0 lb

## 2019-02-22 DIAGNOSIS — I340 Nonrheumatic mitral (valve) insufficiency: Secondary | ICD-10-CM

## 2019-02-22 DIAGNOSIS — I2510 Atherosclerotic heart disease of native coronary artery without angina pectoris: Secondary | ICD-10-CM

## 2019-02-22 NOTE — Patient Instructions (Signed)
Medication Instructions:  No changes *If you need a refill on your cardiac medications before your next appointment, please call your pharmacy*  Lab Work: None  If you have labs (blood work) drawn today and your tests are completely normal, you will receive your results only by: Marland Kitchen MyChart Message (if you have MyChart) OR . A paper copy in the mail If you have any lab test that is abnormal or we need to change your treatment, we will call you to review the results.  Testing/Procedures: Your physician has requested that you have an echocardiogram. Echocardiography is a painless test that uses sound waves to create images of your heart. It provides your doctor with information about the size and shape of your heart and how well your heart's chambers and valves are working. This procedure takes approximately one hour. There are no restrictions for this procedure.  Follow-Up: At [facility], you and your health needs are our priority.  As part of our continuing mission to provide you with exceptional heart care, we have created designated Provider Care Teams.  These Care Teams include your primary Cardiologist (physician) and Advanced Practice Providers (APPs -  Physician Assistants and Nurse Practitioners) who all work together to provide you with the care you need, when you need it.  Your next appointment:   6 month(s)  The format for your next appointment:   Either In Person or Virtual  Provider:   Lauree Chandler, MD  Other Instructions

## 2019-03-08 ENCOUNTER — Ambulatory Visit (HOSPITAL_COMMUNITY): Payer: Medicare HMO | Attending: Cardiovascular Disease

## 2019-03-08 ENCOUNTER — Other Ambulatory Visit: Payer: Self-pay

## 2019-03-08 DIAGNOSIS — I2510 Atherosclerotic heart disease of native coronary artery without angina pectoris: Secondary | ICD-10-CM | POA: Insufficient documentation

## 2019-03-08 DIAGNOSIS — I340 Nonrheumatic mitral (valve) insufficiency: Secondary | ICD-10-CM | POA: Diagnosis not present

## 2019-03-19 ENCOUNTER — Other Ambulatory Visit: Payer: Self-pay

## 2019-03-19 ENCOUNTER — Encounter: Payer: Self-pay | Admitting: Neurology

## 2019-03-19 ENCOUNTER — Ambulatory Visit: Payer: Medicare HMO | Admitting: Neurology

## 2019-03-19 VITALS — BP 130/75 | HR 67 | Resp 20 | Ht 65.5 in | Wt 125.0 lb

## 2019-03-19 DIAGNOSIS — R202 Paresthesia of skin: Secondary | ICD-10-CM

## 2019-03-19 DIAGNOSIS — G629 Polyneuropathy, unspecified: Secondary | ICD-10-CM | POA: Diagnosis not present

## 2019-03-19 NOTE — Patient Instructions (Signed)
Nerve testing of the left arm and leg.  Do not apply lotion or moisturizer to your skin on the day of testing.   ELECTROMYOGRAM AND NERVE CONDUCTION STUDIES (EMG/NCS) INSTRUCTIONS  How to Prepare The neurologist conducting the EMG will need to know if you have certain medical conditions. Tell the neurologist and other EMG lab personnel if you: . Have a pacemaker or any other electrical medical device . Take blood-thinning medications . Have hemophilia, a blood-clotting disorder that causes prolonged bleeding Bathing Take a shower or bath shortly before your exam in order to remove oils from your skin. Don't apply lotions or creams before the exam.  What to Expect You'll likely be asked to change into a hospital gown for the procedure and lie down on an examination table. The following explanations can help you understand what will happen during the exam.  . Electrodes. The neurologist or a technician places surface electrodes at various locations on your skin depending on where you're experiencing symptoms. Or the neurologist may insert needle electrodes at different sites depending on your symptoms.  . Sensations. The electrodes will at times transmit a tiny electrical current that you may feel as a twinge or spasm. The needle electrode may cause discomfort or pain that usually ends shortly after the needle is removed. If you are concerned about discomfort or pain, you may want to talk to the neurologist about taking a short break during the exam.  . Instructions. During the needle EMG, the neurologist will assess whether there is any spontaneous electrical activity when the muscle is at rest - activity that isn't present in healthy muscle tissue - and the degree of activity when you slightly contract the muscle.  He or she will give you instructions on resting and contracting a muscle at appropriate times. Depending on what muscles and nerves the neurologist is examining, he or she may ask you to  change positions during the exam.  After your EMG You may experience some temporary, minor bruising where the needle electrode was inserted into your muscle. This bruising should fade within several days. If it persists, contact your primary care doctor.
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
## 2019-03-29 ENCOUNTER — Telehealth: Payer: Self-pay | Admitting: Internal Medicine

## 2019-03-29 ENCOUNTER — Telehealth: Payer: Self-pay | Admitting: Neurology

## 2019-03-29 NOTE — Telephone Encounter (Signed)
Note to [place]:  This patient is having an EMG on Wednesday, 03/29/19. She called and said her [facility] Medicare requires prior authorization. Will you help with this please?   Response: [place], Yetta Numbers, Starla  Good Morning  EMG's and DAT Scan are done by the nurses involves a lot of pt info and clinicals. You will need cpt and dx code  I have nurse number for [facility] [phone]

## 2019-03-29 NOTE — Telephone Encounter (Signed)
I'm not doing PA's for testing- Haven't been trained on that yet. This is something you or the nurse will have to do for patient. Thanks!

## 2019-03-29 NOTE — Telephone Encounter (Signed)
Do you have time to help with this? Could you reach out to the patient's insurance and see if this required a PA?

## 2019-03-29 NOTE — Telephone Encounter (Signed)
[place] [phone] spoke with Nevin Bloodgood call ref# [id number] to see if Prior Authorization is required for CPT codes 95909,  [phone],  [phone] She said these codes are managed by her PCP Dr. Cathlean Cower at [facility] they work as the 3rd party that [employer] for these codes. She said to call [phone] but she called and transferred me once they were on the line I then spoke with Zenia Resides call ref # [id number] He stated that these codes for outpatient and in-network no authorization is required and verified Dr. Posey Pronto is in-network. I will call the patient and let her know she may come for her testing.

## 2019-03-29 NOTE — Telephone Encounter (Signed)
New message:   Pt is calling and states that Mcarthur Rossetti is requesting a copy of the referral that was sent to the Neurologist (Dr. Posey Pronto) on behalf of the patient. She states they need to give her prior authorization for a test that needs to be scheduled.

## 2019-03-31 ENCOUNTER — Other Ambulatory Visit: Payer: Self-pay

## 2019-03-31 ENCOUNTER — Ambulatory Visit (INDEPENDENT_AMBULATORY_CARE_PROVIDER_SITE_OTHER): Payer: Medicare HMO | Admitting: Neurology

## 2019-03-31 DIAGNOSIS — G629 Polyneuropathy, unspecified: Secondary | ICD-10-CM

## 2019-03-31 DIAGNOSIS — G5622 Lesion of ulnar nerve, left upper limb: Secondary | ICD-10-CM
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
## 2019-03-31 NOTE — Procedures (Signed)
[facility]  [address], [address]  [place], [place] [address] Tel: [phone] Fax:  [phone] Test Date:  03/31/2019  Patient: Sheena Craig DOB: [birth year] Physician: Narda Amber, DO  Sex: Female Height: 5\' 5"  Ref Phys: Narda Amber, DO  ID#: [id number] Temp: 32.0C Technician:    Patient Complaints: This is a 79 year old female referred for evaluation of bilateral hand and feet paresthesias.  NCV & EMG Findings: Extensive electrodiagnostic testing of the left upper extremity and additional studies of the right shows:  1. Left median, mixed palmar, sural, and superficial peroneal sensory responses are within normal limits.  Left ulnar sensory response shows prolonged latency (3.7 ms) normal amplitude.   2. Left median and tibial motor responses are within normal limits.  Left ulnar motor response shows decreased conduction velocity across the elbow (A Elbow-B Elbow, 48 m/s).  The left peroneal motor response is mildly reduced at the extensor digitorum brevis, and normal at the tibialis anterior. 3. Left tibial H reflex study is within normal limits. 4. There is no evidence of active or chronic motor axonal loss changes affecting any of the tested muscles.  Motor unit configuration and recruitment pattern is within normal limits.   Impression: 1. Left ulnar neuropathy with slowing across the elbow, purely demyelinating, mild. 2. There is no evidence of a sensorimotor polyneuropathy or lumbosacral radiculopathy affecting the left lower extremity.   ___________________________ Narda Amber, DO    Nerve Conduction Studies Anti Sensory Summary Table   Stim Site NR Peak (ms) Norm Peak (ms) P-T Amp (V) Norm P-T Amp  Left Median Anti Sensory (2nd Digit)  32C  Wrist    3.7 <3.8 29.2 >10  Left Sup Peroneal Anti Sensory (Ant Lat Mall)  32C  12 cm    2.8 <4.6 6.3 >3  Left Sural Anti Sensory (Lat Mall)  32C  Calf    3.2 <4.6 8.1 >3  Left Ulnar Anti  Sensory (5th Digit)  32C  Wrist    3.7 <3.2 23.5 >5   Motor Summary Table   Stim Site NR Onset (ms) Norm Onset (ms) O-P Amp (mV) Norm O-P Amp Site1 Site2 Delta-0 (ms) Dist (cm) Vel (m/s) Norm Vel (m/s)  Left Median Motor (Abd Poll Brev)  32C  Wrist    3.1 <4.0 7.7 >5 Elbow Wrist 4.7 28.0 60 >50  Elbow    7.8  7.2         Left Peroneal Motor (Ext Dig Brev)  32C  Ankle    5.4 <6.0 2.0 >2.5 B Fib Ankle 8.4 35.0 42 >40  B Fib    13.8  2.0  Poplt B Fib 1.5 7.0 47 >40  Poplt    15.3  1.9         Left Peroneal TA Motor (Tib Ant)  32C  Fib Head    3.3 <4.5 4.9 >3 Poplit Fib Head 1.2 7.0 58 >40  Poplit    4.5  4.8         Left Tibial Motor (Abd Hall Brev)  32C  Ankle    4.6 <6.0 6.3 >4 Knee Ankle 8.9 39.0 44 >40  Knee    13.5  5.0         Left Ulnar Motor (Abd Dig Minimi)  32C  Wrist    2.5 <3.1 10.0 >7 B Elbow Wrist 3.6 24.0 67 >50  B Elbow    6.1  9.1  A Elbow B Elbow 2.1 10.0 48 >50  A Elbow    8.2  8.5          Comparison Summary Table   Stim Site NR Peak (ms) Norm Peak (ms) P-T Amp (V) Site1 Site2 Delta-P (ms) Norm Delta (ms)  Left Median/Ulnar Palm Comparison (Wrist - 8cm)  32C  Median Palm    2.0 <2.2 48.0 Median Palm Ulnar Palm 0.1   Ulnar Palm    2.1 <2.2 21.2       H Reflex Studies   NR H-Lat (ms) Lat Norm (ms) L-R H-Lat (ms)  Left Tibial (Gastroc)  32C     32.24 <35    EMG   Side Muscle Ins Act Fibs Psw Fasc Number Recrt Dur Dur. Amp Amp. Poly Poly. Comment  Left 1stDorInt Nml Nml Nml Nml Nml Nml Nml Nml Nml Nml Nml Nml N/A  Left AntTibialis Nml Nml Nml Nml Nml Nml Nml Nml Nml Nml Nml Nml N/A  Left Gastroc Nml Nml Nml Nml Nml Nml Nml Nml Nml Nml Nml Nml N/A  Left Flex Dig Long Nml Nml Nml Nml Nml Nml Nml Nml Nml Nml Nml Nml N/A  Left RectFemoris Nml Nml Nml Nml Nml Nml Nml Nml Nml Nml Nml Nml N/A  Left GluteusMed Nml Nml Nml Nml Nml Nml Nml Nml Nml Nml Nml Nml N/A  Left PronatorTeres Nml Nml Nml Nml Nml Nml Nml Nml Nml Nml Nml Nml N/A  Left Biceps Nml Nml Nml  Nml Nml Nml Nml Nml Nml Nml Nml Nml N/A  Left Triceps Nml Nml Nml Nml Nml Nml Nml Nml Nml Nml Nml Nml N/A  Left Deltoid Nml Nml Nml Nml Nml Nml Nml Nml Nml Nml Nml Nml N/A  Left FlexCarpiUln Nml Nml Nml Nml Nml Nml Nml Nml Nml Nml Nml Nml N/A      Waveforms:

## 2019-04-01 NOTE — Telephone Encounter (Signed)
Spoke with patient discussed message, spoke with referral coordinator. Nothing further is needed

## 2019-07-16 ENCOUNTER — Other Ambulatory Visit: Payer: Self-pay | Admitting: Internal Medicine

## 2019-07-26 DIAGNOSIS — S80262A Insect bite (nonvenomous), left knee, initial encounter: Secondary | ICD-10-CM | POA: Diagnosis not present

## 2019-07-26 DIAGNOSIS — S30861A Insect bite (nonvenomous) of abdominal wall, initial encounter: Secondary | ICD-10-CM | POA: Diagnosis not present

## 2019-07-26 DIAGNOSIS — S80261A Insect bite (nonvenomous), right knee, initial encounter: Secondary | ICD-10-CM | POA: Diagnosis not present

## 2019-09-09 ENCOUNTER — Encounter: Payer: Self-pay | Admitting: Internal Medicine

## 2019-09-16 ENCOUNTER — Encounter: Payer: Self-pay | Admitting: Internal Medicine

## 2019-09-16 ENCOUNTER — Ambulatory Visit (INDEPENDENT_AMBULATORY_CARE_PROVIDER_SITE_OTHER): Payer: Medicare HMO | Admitting: Internal Medicine

## 2019-09-16 ENCOUNTER — Other Ambulatory Visit: Payer: Self-pay

## 2019-09-16 VITALS — BP 124/70 | HR 96 | Temp 97.9°F | Ht 65.5 in | Wt 127.0 lb

## 2019-09-16 DIAGNOSIS — R002 Palpitations: Secondary | ICD-10-CM | POA: Insufficient documentation

## 2019-09-16 DIAGNOSIS — D510 Vitamin B12 deficiency anemia due to intrinsic factor deficiency: Secondary | ICD-10-CM | POA: Diagnosis not present

## 2019-09-16 DIAGNOSIS — E039 Hypothyroidism, unspecified: Secondary | ICD-10-CM | POA: Diagnosis not present

## 2019-09-16 DIAGNOSIS — Z23 Encounter for immunization: Secondary | ICD-10-CM

## 2019-09-16 DIAGNOSIS — D508 Other iron deficiency anemias: Secondary | ICD-10-CM | POA: Diagnosis not present

## 2019-09-16 NOTE — Progress Notes (Signed)
Subjective:  Patient ID: Sheena Craig, female    DOB: 01-14-1941  Age: 79 y.o. MRN: [id number]  CC: Hypothyroidism and Palpitations  This visit occurred during the SARS-CoV-2 public health emergency.  Safety protocols were in place, including screening questions prior to the visit, additional usage of staff PPE, and extensive cleaning of exam room while observing appropriate contact time as indicated for disinfecting solutions.   NEW TO ME  HPI SHADIYAH WERNLI presents for f/up - She has a lifelong history of intermittent orthostatic hypotension but has felt worse for the last 2 months.  She does not tolerate the heat well.  Over the last 2 months she has been monitoring her blood pressure and her pulse.  Her pulse monitor says her pulse is intermittent irregular but she is not aware of any palpitations.  Her blood pressure monitor says that her systolic has been down to 70 or 80 a couple of times.  With this she has felt weak, dizzy, and lightheaded but she has not had near syncope or presyncope.  Outpatient Medications Prior to Visit  Medication Sig Dispense Refill  . alendronate (FOSAMAX) 70 MG tablet Take 1 tablet (70 mg total) by mouth every 7 (seven) days. Take with a full glass of water on an empty stomach. 12 tablet 3  . Ascorbic Acid (VITAMIN C) 500 MG CAPS Take 1 tablet by mouth daily.    Marland Kitchen aspirin (ASPIRIN 81) 81 MG EC tablet Take 1 tablet (81 mg total) by mouth daily. Swallow whole. 30 tablet 12  . BIOTIN PO Take 1 tablet by mouth daily.    . Calcium-Vitamin D-Vitamin K (CALCIUM SOFT CHEWS PO) Take 3 tablets by mouth daily.    . cyanocobalamin (,VITAMIN B-12,) 1000 MCG/ML injection INJECT 1 ML INTRAMUSCULARLY MONTHLY AS DIRECTED 3 mL 5  . dextromethorphan-guaiFENesin (MUCINEX DM) 30-600 MG per 12 hr tablet Take 1 tablet by mouth 2 (two) times daily as needed for cough.    . docusate sodium (COLACE) 100 MG capsule Take 100 mg by mouth daily as needed for mild  constipation.    . Ferrous Sulfate (FE-CAPS PO) Take 1 capsule by mouth 3 (three) times a week.    Marland Kitchen FOLIC ACID PO Take 1 tablet by mouth 3 (three) times a week.    . levothyroxine (SYNTHROID) 100 MCG tablet TAKE 1 TABLET EVERY DAY 90 tablet 1  . Magnesium 400 MG CAPS Take 1 tablet by mouth every morning.    . magnesium oxide (MAG-OX) 400 MG tablet Take 400 mg by mouth 3 (three) times a week.    . NONFORMULARY OR COMPOUNDED ITEM 1 tablet daily.    . NONFORMULARY OR COMPOUNDED ITEM 1 tablet daily.    . rosuvastatin (CRESTOR) 10 MG tablet TAKE 1 TABLET EVERY DAY 90 tablet 1  . SYRINGE-NEEDLE, DISP, 3 ML (B-D 3CC LUER-LOK SYR 25GX1") 25G X 1" 3 ML MISC USE FOR B12 SHOTS ONCE A MONTH 100 each 1   No facility-administered medications prior to visit.    ROS Review of Systems  Constitutional: Negative.  Negative for appetite change, diaphoresis, fatigue and unexpected weight change.  HENT: Negative.   Eyes: Negative for visual disturbance.  Respiratory: Negative for cough, chest tightness, shortness of breath and wheezing.   Cardiovascular: Positive for palpitations. Negative for chest pain and leg swelling.  Gastrointestinal: Negative for abdominal pain, constipation, diarrhea, nausea and vomiting.  Endocrine: Positive for heat intolerance. Negative for cold intolerance, polydipsia, polyphagia and polyuria.  Genitourinary: Negative.  Negative for difficulty urinating and dysuria.  Musculoskeletal: Negative.  Negative for arthralgias and myalgias.  Skin: Negative.  Negative for color change and pallor.  Neurological: Positive for dizziness, weakness and light-headedness. Negative for syncope, numbness and headaches.  Hematological: Negative for adenopathy. Does not bruise/bleed easily.  Psychiatric/Behavioral: Negative.     Objective:  BP 124/70   Pulse 96   Temp 97.9 F (36.6 C) (Oral)   Ht 5' 5.5" (1.664 m)   Wt 127 lb (57.6 kg)   SpO2 98%   BMI 20.81 kg/m   BP Readings from Last  3 Encounters:  09/16/19 124/70  03/19/19 130/75  02/22/19 128/80    Wt Readings from Last 3 Encounters:  09/16/19 127 lb (57.6 kg)  03/19/19 125 lb (56.7 kg)  02/22/19 126 lb (57.2 kg)    Physical Exam Vitals reviewed.  Constitutional:      Appearance: Normal appearance.  HENT:     Nose: Nose normal.     Mouth/Throat:     Mouth: Mucous membranes are moist.  Eyes:     General: No scleral icterus.    Conjunctiva/sclera: Conjunctivae normal.  Cardiovascular:     Rate and Rhythm: Regular rhythm. Bradycardia present.     Heart sounds: No murmur heard.      Comments: EKG - Sinus bradycardia, 59 bpm Otherwise normal EKG Pulmonary:     Effort: Pulmonary effort is normal.     Breath sounds: No stridor. No wheezing, rhonchi or rales.  Abdominal:     General: Abdomen is flat.     Palpations: There is no mass.     Tenderness: There is no abdominal tenderness. There is no guarding.  Musculoskeletal:        General: Normal range of motion.     Cervical back: Neck supple.     Right lower leg: No edema.     Left lower leg: No edema.  Lymphadenopathy:     Cervical: No cervical adenopathy.  Skin:    General: Skin is warm and dry.     Coloration: Skin is not pale.  Neurological:     General: No focal deficit present.     Mental Status: She is alert.     Lab Results  Component Value Date   WBC 5.9 09/16/2019   HGB 13.4 09/16/2019   HCT 41.1 09/16/2019   PLT 354 09/16/2019   GLUCOSE 70 09/16/2019   CHOL 189 11/27/2018   TRIG 84.0 11/27/2018   HDL 61.80 11/27/2018   LDLDIRECT 127.0 11/11/2014   LDLCALC 111 (H) 11/27/2018   ALT 15 11/27/2018   AST 20 11/27/2018   NA 139 09/16/2019   K 4.2 09/16/2019   CL 104 09/16/2019   CREATININE 0.72 09/16/2019   BUN 13 09/16/2019   CO2 27 09/16/2019   TSH 7.46 (H) 09/16/2019    CT CARDIAC SCORING  Addendum Date: 12/29/2018   ADDENDUM REPORT: 12/29/2018 11:38 CLINICAL DATA:  Risk stratification EXAM: Coronary Calcium Score  TECHNIQUE: The patient was scanned on a [employer] scanner. Axial non-contrast 3 mm slices were carried out through the heart. The data set was analyzed on a dedicated work station and scored using the [place]. FINDINGS: Non-cardiac: See separate report from [facility]. Ascending Aorta: Normal Caliber.  Calcifications noted. Pericardium: Normal Coronary arteries: Normal coronary origins. Calcifications present in the LAD and RCA. IMPRESSION: Coronary calcium score of 486. This was 83rd percentile for age and sex matched control. Fransico Him Electronically Signed  By: Fransico Him   On: 12/29/2018 11:38   Result Date: 12/29/2018 EXAM: OVER-READ INTERPRETATION  CT CHEST The following report is an over-read performed by radiologist Dr. Abigail Miyamoto of [facility], [place] on 12/24/2018. This over-read does not include interpretation of cardiac or coronary anatomy or pathology. The coronary CTA interpretation by the cardiologist is attached. COMPARISON:  09/03/2017 chest radiograph.  No prior CT. FINDINGS: Vascular: Aortic atherosclerosis. Mediastinum/Nodes: No imaged thoracic adenopathy. Lungs/Pleura: No imaged pleural fluid. Relatively diffuse mild pulmonary nodularity, the majority in a peribronchovascular distribution. Areas of volume loss and bronchiectasis within the right middle lobe and lingula. No consolidation or well-defined dominant lung mass. Areas of mucoid impaction, including within the left lower lobe. Upper Abdomen: Normal imaged portions of the liver, spleen, stomach. Musculoskeletal: Osteopenia. Advanced midthoracic spondylosis. Mild vertebral body height loss within the lower thoracic spine. IMPRESSION: 1. Diffuse primarily peribronchovascular nodularity with areas of mucoid impaction, bronchiectasis as detailed above. 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 Correlate with pulmonary symptoms and consider dedicated  chest CT. 2.  Aortic Atherosclerosis (ICD10-I70.0). Electronically Signed: By: Abigail Miyamoto M.D. On: 12/24/2018 16:57    Assessment & Plan:   Mercedies was seen today for hypothyroidism and palpitations.  Diagnoses and all orders for this visit:  Acquired hypothyroidism- Her TSH is in the acceptable range.  I recommended she stay on the current dose of levothyroxine. -     TSH; Future -     TSH  Palpitations- Her labs are negative for secondary causes.  Her EKG shows sinus bradycardia.  I recommended that she wear an event monitor to see if she is experiencing a dysrhythmia. -     CARDIAC EVENT MONITOR; Future -     BASIC METABOLIC PANEL WITH GFR; Future -     EKG 12-Lead -     BASIC METABOLIC PANEL WITH GFR  Iron deficiency anemia secondary to inadequate dietary iron intake- Her H&H and iron levels are normal. -     CBC with Differential/Platelet; Future -     Iron; Future -     Ferritin; Future -     Ferritin -     Iron -     CBC with Differential/Platelet  Pernicious anemia-B12 deficiency- Her H&H and folate levels are normal.  She will continue B12 replacement therapy. -     CBC with Differential/Platelet; Future -     Folate; Future -     Folate -     CBC with Differential/Platelet  Other orders -     Flu Vaccine QUAD 6+ mos PF IM (Fluarix Quad PF)   I am having Sheena Craig "Pat" maintain her Calcium-Vitamin D-Vitamin K (CALCIUM SOFT CHEWS PO), SYRINGE-NEEDLE (DISP) 3 ML, dextromethorphan-guaiFENesin, Vitamin C, docusate sodium, Magnesium, NONFORMULARY OR COMPOUNDED ITEM, magnesium oxide, BIOTIN PO, Ferrous Sulfate (FE-CAPS PO), FOLIC ACID PO, NONFORMULARY OR COMPOUNDED ITEM, cyanocobalamin, aspirin, alendronate, levothyroxine, and rosuvastatin.  No orders of the defined types were placed in this encounter.  I spent 50 minutes in preparing to see the patient by review of recent labs, imaging and procedures, obtaining and reviewing separately obtained history,  communicating with the patient and family or caregiver, ordering medications, tests or procedures, and documenting clinical information in the EHR including the differential Dx, treatment, and any further evaluation and other management of 1. Acquired hypothyroidism 2. Palpitations 3. Iron deficiency anemia secondary to inadequate dietary iron intake 4. Pernicious anemia-B12 deficiency  Follow-up: Return in about 2 months (around 11/16/2019).  Scarlette Calico, MD

## 2019-09-16 NOTE — Patient Instructions (Signed)
Hypothyroidism  Hypothyroidism is when the thyroid gland does not make enough of certain hormones (it is underactive). The thyroid gland is a small gland located in the lower front part of the neck, just in front of the windpipe (trachea). This gland makes hormones that help control how the body uses food for energy (metabolism) as well as how the heart and brain function. These hormones also play a role in keeping your bones strong. When the thyroid is underactive, it produces too little of the hormones thyroxine (T4) and triiodothyronine (T3). What are the causes? This condition may be caused by:  Hashimoto's disease. This is a disease in which the body's disease-fighting system (immune system) attacks the thyroid gland. This is the most common cause.  Viral infections.  Pregnancy.  Certain medicines.  Birth defects.  Past radiation treatments to the head or neck for cancer.  Past treatment with radioactive iodine.  Past exposure to radiation in the environment.  Past surgical removal of part or all of the thyroid.  Problems with a gland in the center of the brain (pituitary gland).  Lack of enough iodine in the diet. What increases the risk? You are more likely to develop this condition if:  You are female.  You have a family history of thyroid conditions.  You use a medicine called lithium.  You take medicines that affect the immune system (immunosuppressants). What are the signs or symptoms? Symptoms of this condition include:  Feeling as though you have no energy (lethargy).  Not being able to tolerate cold.  Weight gain that is not explained by a change in diet or exercise habits.  Lack of appetite.  Dry skin.  Coarse hair.  Menstrual irregularity.  Slowing of thought processes.  Constipation.  Sadness or depression. How is this diagnosed? This condition may be diagnosed based on:  Your symptoms, your medical history, and a physical exam.  Blood  tests. You may also have imaging tests, such as an ultrasound or MRI. How is this treated? This condition is treated with medicine that replaces the thyroid hormones that your body does not make. After you begin treatment, it may take several weeks for symptoms to go away. Follow these instructions at home:  Take over-the-counter and prescription medicines only as told by your health care provider.  If you start taking any new medicines, tell your health care provider.  Keep all follow-up visits as told by your health care provider. This is important. ? As your condition improves, your dosage of thyroid hormone medicine may change. ? You will need to have blood tests regularly so that your health care provider can monitor your condition. Contact a health care provider if:  Your symptoms do not get better with treatment.  You are taking thyroid replacement medicine and you: ? Sweat a lot. ? Have tremors. ? Feel anxious. ? Lose weight rapidly. ? Cannot tolerate heat. ? Have emotional swings. ? Have diarrhea. ? Feel weak. Get help right away if you have:  Chest pain.  An irregular heartbeat.  A rapid heartbeat.  Difficulty breathing. Summary  Hypothyroidism is when the thyroid gland does not make enough of certain hormones (it is underactive).  When the thyroid is underactive, it produces too little of the hormones thyroxine (T4) and triiodothyronine (T3).  The most common cause is Hashimoto's disease, a disease in which the body's disease-fighting system (immune system) attacks the thyroid gland. The condition can also be caused by viral infections, medicine, pregnancy, or past   radiation treatment to the head or neck.  Symptoms may include weight gain, dry skin, constipation, feeling as though you do not have energy, and not being able to tolerate cold.  This condition is treated with medicine to replace the thyroid hormones that your body does not make. This information  is not intended to replace advice given to you by your health care provider. Make sure you discuss any questions you have with your health care provider. Document Revised: 12/13/2016 Document Reviewed: 12/11/2016 Elsevier Patient Education  2020 [employer]  

## 2019-09-17 ENCOUNTER — Encounter: Payer: Self-pay | Admitting: Internal Medicine

## 2019-09-17 LAB — BASIC METABOLIC PANEL WITH GFR
BUN: 13 mg/dL (ref 7–25)
CO2: 27 mmol/L (ref 20–32)
Calcium: 10 mg/dL (ref 8.6–10.4)
Chloride: 104 mmol/L (ref 98–110)
Creat: 0.72 mg/dL (ref 0.60–0.93)
GFR, Est African American: 94 mL/min/{1.73_m2} (ref >=60)
GFR, Est Non African American: 81 mL/min/{1.73_m2} (ref >=60)
Glucose, Bld: 70 mg/dL (ref 65–99)
Potassium: 4.2 mmol/L (ref 3.5–5.3)
Sodium: 139 mmol/L (ref 135–146)

## 2019-09-17 LAB — CBC WITH DIFFERENTIAL/PLATELET
Absolute Monocytes: 496 cells/uL (ref 200–950)
Basophils Absolute: 41 cells/uL (ref 0–200)
Basophils Relative: 0.7 %
Eosinophils Absolute: 153 cells/uL (ref 15–500)
Eosinophils Relative: 2.6 %
HCT: 41.1 % (ref 35.0–45.0)
Hemoglobin: 13.4 g/dL (ref 11.7–15.5)
Lymphs Abs: 1481 cells/uL (ref 850–3900)
MCH: 30.8 pg (ref 27.0–33.0)
MCHC: 32.6 g/dL (ref 32.0–36.0)
MCV: 94.5 fL (ref 80.0–100.0)
MPV: 10 fL (ref 7.5–12.5)
Monocytes Relative: 8.4 %
Neutro Abs: 3729 cells/uL (ref 1500–7800)
Neutrophils Relative %: 63.2 %
Platelets: 354 10*3/uL (ref 140–400)
RBC: 4.35 10*6/uL (ref 3.80–5.10)
RDW: 11.9 % (ref 11.0–15.0)
Total Lymphocyte: 25.1 %
WBC: 5.9 10*3/uL (ref 3.8–10.8)

## 2019-09-17 LAB — TSH: TSH: 7.46 mIU/L — ABNORMAL HIGH (ref 0.40–4.50)

## 2019-09-17 LAB — FOLATE: Folate: >24 ng/mL

## 2019-09-17 LAB — IRON: Iron: 89 ug/dL (ref 45–160)

## 2019-09-17 LAB — FERRITIN: Ferritin: 110 ng/mL (ref 16–288)

## 2019-09-18 ENCOUNTER — Encounter: Payer: Self-pay | Admitting: Internal Medicine

## 2019-09-19 NOTE — Addendum Note (Signed)
Addended by: Janith Lima on: 09/19/2019 11:22 AM   Modules accepted: Orders

## 2019-09-21 ENCOUNTER — Telehealth: Payer: Self-pay | Admitting: Radiology

## 2019-09-21 NOTE — Telephone Encounter (Signed)
Enrolled patient for a 30 day [employer] Event Monitor to be mailed to patients home. Brief instructions were gone over with the patients husband (DPR) and they know to expect the monitor to arrive in 3-4 days.

## 2019-09-23 ENCOUNTER — Ambulatory Visit: Payer: Medicare HMO | Admitting: Internal Medicine

## 2019-09-29 ENCOUNTER — Ambulatory Visit (INDEPENDENT_AMBULATORY_CARE_PROVIDER_SITE_OTHER): Payer: Medicare HMO

## 2019-09-29 DIAGNOSIS — R002 Palpitations: Secondary | ICD-10-CM | POA: Diagnosis not present

## 2019-10-28 ENCOUNTER — Encounter: Payer: Self-pay | Admitting: Internal Medicine

## 2019-10-29 NOTE — Telephone Encounter (Signed)
Using wrist cuff.  Husband uses same and gets very different numbers so she feels monitor is accurate.  Will compare w arm cuff to be sure.  Feels fatigued when BP low.  This am better: 108/70.  HR 65-80 Yesterday  81/39 and 89/49 readings HR was irregular. These are not new. Dr. Gwynn Burly office ordered her an event monitor for her irregular HR and BP.  The monitor was sent back yesterday.  Checked pulse on phone it is regular.     9/17 ---62/40, irregular.  Standing, cooking, hot.  Got hot flash and faint feeling.    Avoids caffeine and no alcohol.  Stays hydrated.  Has hx of low BPs, passed out before on several occasions.    Encouraged her to increase fluids and salt, could try support stockings/spanx. Is scheduled in cardiology on 11/15/19.

## 2019-11-12 DIAGNOSIS — H16223 Keratoconjunctivitis sicca, not specified as Sjogren's, bilateral: Secondary | ICD-10-CM | POA: Diagnosis not present

## 2019-11-12 DIAGNOSIS — Z961 Presence of intraocular lens: Secondary | ICD-10-CM | POA: Diagnosis not present

## 2019-11-12 DIAGNOSIS — H35363 Drusen (degenerative) of macula, bilateral: Secondary | ICD-10-CM | POA: Diagnosis not present

## 2019-11-12 DIAGNOSIS — H18452 Nodular corneal degeneration, left eye: Secondary | ICD-10-CM | POA: Diagnosis not present

## 2019-11-15 ENCOUNTER — Ambulatory Visit: Payer: Medicare HMO | Admitting: Cardiovascular Disease

## 2019-11-15 ENCOUNTER — Other Ambulatory Visit: Payer: Self-pay

## 2019-11-15 ENCOUNTER — Encounter: Payer: Self-pay | Admitting: Cardiovascular Disease

## 2019-11-15 VITALS — BP 130/60 | HR 65 | Ht 65.5 in | Wt 126.6 lb

## 2019-11-15 DIAGNOSIS — I2510 Atherosclerotic heart disease of native coronary artery without angina pectoris: Secondary | ICD-10-CM | POA: Diagnosis not present

## 2019-11-15 DIAGNOSIS — I493 Ventricular premature depolarization: Secondary | ICD-10-CM

## 2019-11-15 DIAGNOSIS — I951 Orthostatic hypotension: Secondary | ICD-10-CM | POA: Diagnosis not present

## 2019-11-15 DIAGNOSIS — I340 Nonrheumatic mitral (valve) insufficiency: Secondary | ICD-10-CM
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
## 2019-11-15 NOTE — Patient Instructions (Signed)

## 2020-02-15 ENCOUNTER — Other Ambulatory Visit: Payer: Self-pay | Admitting: Internal Medicine

## 2020-02-15 NOTE — Telephone Encounter (Signed)
Please refill as per office routine med refill policy (all routine meds refilled for 3 mo or monthly per pt preference up to one year from last visit, then month to month grace period for 3 mo, then further med refills will have to be denied)  

## 2020-02-17 DIAGNOSIS — H524 Presbyopia: Secondary | ICD-10-CM | POA: Diagnosis not present

## 2020-02-22 ENCOUNTER — Ambulatory Visit (INDEPENDENT_AMBULATORY_CARE_PROVIDER_SITE_OTHER): Payer: Medicare HMO

## 2020-02-22 ENCOUNTER — Other Ambulatory Visit: Payer: Self-pay

## 2020-02-22 VITALS — BP 130/70 | HR 72 | Temp 98.0°F | Ht 66.0 in | Wt 126.8 lb

## 2020-02-22 DIAGNOSIS — Z0000 Encounter for general adult medical examination without abnormal findings: Secondary | ICD-10-CM
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
## 2020-02-22 NOTE — Patient Instructions (Addendum)
[name] , Thank you for taking time to come for your Medicare Wellness Visit. I appreciate your ongoing commitment to your health goals. Please review the following plan we discussed and let me know if I can assist you in the future.   Screening recommendations/referrals: Colonoscopy: no repeat due to age 80: scheduled for 2022 Bone Density: 12/04/2018; due every 2 years Recommended yearly ophthalmology/optometry visit for glaucoma screening and checkup Recommended yearly dental visit for hygiene and checkup  Vaccinations: Influenza vaccine: 09/16/2019 Pneumococcal vaccine: 09/22/2008, 09/29/2012 Tdap vaccine: 12/04/2018; due every 10 years Shingles vaccine: 02/01/2018, 07/06/2018   Covid-19: 02/04/2019, 02/25/2019, 10/27/2019  Advanced directives: Please bring a copy of your health care power of attorney and living will to the office at your convenience.  Conditions/risks identified: Yes; Reviewed health maintenance screenings with patient today and relevant education, vaccines, and/or referrals were provided. Please continue to do your personal lifestyle choices by: daily care of teeth and gums, regular physical activity (goal should be 5 days a week for 30 minutes), eat a healthy diet, avoid tobacco and drug use, limiting any alcohol intake, taking a low-dose aspirin (if not allergic or have been advised by your provider otherwise) and taking vitamins and minerals as recommended by your provider. Continue doing brain stimulating activities (puzzles, reading, adult coloring books, staying active) to keep memory sharp. Continue to eat heart healthy diet (full of fruits, vegetables, whole grains, lean protein, water--limit salt, fat, and sugar intake) and increase physical activity as tolerated.  Next appointment:  Please schedule your next Medicare Wellness Visit with your Nurse Health Advisor in 1 year by calling [phone].   Preventive Care 81 Years and Older, Female Preventive  care refers to lifestyle choices and visits with your health care provider that can promote health and wellness. What does preventive care include?  A yearly physical exam. This is also called an annual well check.  Dental exams once or twice a year.  Routine eye exams. Ask your health care provider how often you should have your eyes checked.  Personal lifestyle choices, including:  Daily care of your teeth and gums.  Regular physical activity.  Eating a healthy diet.  Avoiding tobacco and drug use.  Limiting alcohol use.  Practicing safe sex.  Taking low-dose aspirin every day.  Taking vitamin and mineral supplements as recommended by your health care provider. What happens during an annual well check? The services and screenings done by your health care provider during your annual well check will depend on your age, overall health, lifestyle risk factors, and family history of disease. Counseling  Your health care provider may ask you questions about your:  Alcohol use.  Tobacco use.  Drug use.  Emotional well-being.  Home and relationship well-being.  Sexual activity.  Eating habits.  History of falls.  Memory and ability to understand (cognition).  Work and work Statistician.  Reproductive health. Screening  You may have the following tests or measurements:  Height, weight, and BMI.  Blood pressure.  Lipid and cholesterol levels. These may be checked every 5 years, or more frequently if you are over 26 years old.  Skin check.  Lung cancer screening. You may have this screening every year starting at age 54 if you have a 30-pack-year history of smoking and currently smoke or have quit within the past 15 years.  Fecal occult blood test (FOBT) of the stool. You may have this test every year starting at age 56.  Flexible sigmoidoscopy or colonoscopy. You  may have a sigmoidoscopy every 5 years or a colonoscopy every 10 years starting at age  16.  Hepatitis C blood test.  Hepatitis B blood test.  Sexually transmitted disease (STD) testing.  Diabetes screening. This is done by checking your blood sugar (glucose) after you have not eaten for a while (fasting). You may have this done every 1-3 years.  Bone density scan. This is done to screen for osteoporosis. You may have this done starting at age 38.  Mammogram. This may be done every 1-2 years. Talk to your health care provider about how often you should have regular mammograms. Talk with your health care provider about your test results, treatment options, and if necessary, the need for more tests. Vaccines  Your health care provider may recommend certain vaccines, such as:  Influenza vaccine. This is recommended every year.  Tetanus, diphtheria, and acellular pertussis (Tdap, Td) vaccine. You may need a Td booster every 10 years.  Zoster vaccine. You may need this after age 32.  Pneumococcal 13-valent conjugate (PCV13) vaccine. One dose is recommended after age 51.  Pneumococcal polysaccharide (PPSV23) vaccine. One dose is recommended after age 17. Talk to your health care provider about which screenings and vaccines you need and how often you need them. This information is not intended to replace advice given to you by your health care provider. Make sure you discuss any questions you have with your health care provider. Document Released: 01/27/2015 Document Revised: 09/20/2015 Document Reviewed: 11/01/2014 Elsevier Interactive Patient Education  2017 [place] Prevention in the Home Falls can cause injuries. They can happen to people of all ages. There are many things you can do to make your home safe and to help prevent falls. What can I do on the outside of my home?  Regularly fix the edges of walkways and driveways and fix any cracks.  Remove anything that might make you trip as you walk through a door, such as a raised step or threshold.  Trim  any bushes or trees on the path to your home.  Use bright outdoor lighting.  Clear any walking paths of anything that might make someone trip, such as rocks or tools.  Regularly check to see if handrails are loose or broken. Make sure that both sides of any steps have handrails.  Any raised decks and porches should have guardrails on the edges.  Have any leaves, snow, or ice cleared regularly.  Use sand or salt on walking paths during winter.  Clean up any spills in your garage right away. This includes oil or grease spills. What can I do in the bathroom?  Use night lights.  Install grab bars by the toilet and in the tub and shower. Do not use towel bars as grab bars.  Use non-skid mats or decals in the tub or shower.  If you need to sit down in the shower, use a plastic, non-slip stool.  Keep the floor dry. Clean up any water that spills on the floor as soon as it happens.  Remove soap buildup in the tub or shower regularly.  Attach bath mats securely with double-sided non-slip rug tape.  Do not have throw rugs and other things on the floor that can make you trip. What can I do in the bedroom?  Use night lights.  Make sure that you have a light by your bed that is easy to reach.  Do not use any sheets or blankets that are too big for  your bed. They should not hang down onto the floor.  Have a firm chair that has side arms. You can use this for support while you get dressed.  Do not have throw rugs and other things on the floor that can make you trip. What can I do in the kitchen?  Clean up any spills right away.  Avoid walking on wet floors.  Keep items that you use a lot in easy-to-reach places.  If you need to reach something above you, use a strong step stool that has a grab bar.  Keep electrical cords out of the way.  Do not use floor polish or wax that makes floors slippery. If you must use wax, use non-skid floor wax.  Do not have throw rugs and other  things on the floor that can make you trip. What can I do with my stairs?  Do not leave any items on the stairs.  Make sure that there are handrails on both sides of the stairs and use them. Fix handrails that are broken or loose. Make sure that handrails are as long as the stairways.  Check any carpeting to make sure that it is firmly attached to the stairs. Fix any carpet that is loose or worn.  Avoid having throw rugs at the top or bottom of the stairs. If you do have throw rugs, attach them to the floor with carpet tape.  Make sure that you have a light switch at the top of the stairs and the bottom of the stairs. If you do not have them, ask someone to add them for you. What else can I do to help prevent falls?  Wear shoes that:  Do not have high heels.  Have rubber bottoms.  Are comfortable and fit you well.  Are closed at the toe. Do not wear sandals.  If you use a stepladder:  Make sure that it is fully opened. Do not climb a closed stepladder.  Make sure that both sides of the stepladder are locked into place.  Ask someone to hold it for you, if possible.  Clearly mark and make sure that you can see:  Any grab bars or handrails.  First and last steps.  Where the edge of each step is.  Use tools that help you move around (mobility aids) if they are needed. These include:  Canes.  Walkers.  Scooters.  Crutches.  Turn on the lights when you go into a dark area. Replace any light bulbs as soon as they burn out.  Set up your furniture so you have a clear path. Avoid moving your furniture around.  If any of your floors are uneven, fix them.  If there are any pets around you, be aware of where they are.  Review your medicines with your doctor. Some medicines can make you feel dizzy. This can increase your chance of falling. Ask your doctor what other things that you can do to help prevent falls. This information is not intended to replace advice given to  you by your health care provider. Make sure you discuss any questions you have with your health care provider. Document Released: 10/27/2008 Document Revised: 06/08/2015 Document Reviewed: 02/04/2014 Elsevier Interactive Patient Education  2017 [employer].

## 2020-02-26 ENCOUNTER — Encounter: Payer: Self-pay | Admitting: Internal Medicine

## 2020-02-26 ENCOUNTER — Other Ambulatory Visit: Payer: Self-pay | Admitting: Internal Medicine

## 2020-02-26 DIAGNOSIS — E538 Deficiency of other specified B group vitamins: Secondary | ICD-10-CM | POA: Insufficient documentation

## 2020-02-26 NOTE — Telephone Encounter (Signed)
Ok to contact pt  It should be ok to change to otc B12 1000 mcg per day; hope she is ok with that

## 2020-02-28 ENCOUNTER — Encounter: Payer: Self-pay | Admitting: Internal Medicine

## 2020-02-28 MED ORDER — CYANOCOBALAMIN 1000 MCG/ML IJ SOLN: INTRAMUSCULAR | 5 refills | Status: DC

## 2020-03-13 ENCOUNTER — Other Ambulatory Visit: Payer: Self-pay | Admitting: Internal Medicine

## 2020-03-13 NOTE — Telephone Encounter (Signed)
Please refill as per office routine med refill policy (all routine meds refilled for 3 mo or monthly per pt preference up to one year from last visit, then month to month grace period for 3 mo, then further med refills will have to be denied)  

## 2020-03-24 DIAGNOSIS — Z20822 Contact with and (suspected) exposure to covid-19: Secondary | ICD-10-CM | POA: Diagnosis not present

## 2020-05-09 NOTE — Telephone Encounter (Signed)
Called patient and discussed recommendations to help with low BPs/fatigue.  She will increase fluids and salt, being mindful of the warmer temps.  Discussed compression, spanx, elevating legs on 3 pillows when feeling poorly.  She is appreciative of the information provided.

## 2020-05-25 DIAGNOSIS — Z1231 Encounter for screening mammogram for malignant neoplasm of breast: Secondary | ICD-10-CM | POA: Diagnosis not present

## 2020-05-25 DIAGNOSIS — Z01419 Encounter for gynecological examination (general) (routine) without abnormal findings: Secondary | ICD-10-CM | POA: Diagnosis not present

## 2020-05-25 LAB — HM MAMMOGRAPHY

## 2020-06-01 ENCOUNTER — Encounter: Payer: Self-pay | Admitting: Internal Medicine

## 2020-06-27 ENCOUNTER — Encounter: Payer: Self-pay | Admitting: Internal Medicine

## 2020-06-27 DIAGNOSIS — D480 Neoplasm of uncertain behavior of bone and articular cartilage: Secondary | ICD-10-CM

## 2020-07-17 ENCOUNTER — Encounter: Payer: Self-pay | Admitting: Internal Medicine

## 2020-07-17 DIAGNOSIS — Z0000 Encounter for general adult medical examination without abnormal findings: Secondary | ICD-10-CM

## 2020-07-17 DIAGNOSIS — E538 Deficiency of other specified B group vitamins: Secondary | ICD-10-CM

## 2020-07-17 DIAGNOSIS — R739 Hyperglycemia, unspecified: Secondary | ICD-10-CM

## 2020-07-17 DIAGNOSIS — E559 Vitamin D deficiency, unspecified: Secondary | ICD-10-CM

## 2020-07-18 NOTE — Telephone Encounter (Signed)
Staff to contact pt - due for CPX at her convenience with lab testing done a few days prior at the [facility]  thanks

## 2020-08-09 ENCOUNTER — Other Ambulatory Visit: Payer: Self-pay | Admitting: Internal Medicine

## 2020-08-09 NOTE — Telephone Encounter (Signed)
Please refill as per office routine med refill policy (all routine meds refilled for 3 mo or monthly per pt preference up to one year from last visit, then month to month grace period for 3 mo, then further med refills will have to be denied)  

## 2020-08-17 ENCOUNTER — Other Ambulatory Visit (INDEPENDENT_AMBULATORY_CARE_PROVIDER_SITE_OTHER): Payer: Medicare HMO

## 2020-08-17 DIAGNOSIS — R739 Hyperglycemia, unspecified: Secondary | ICD-10-CM

## 2020-08-17 DIAGNOSIS — E559 Vitamin D deficiency, unspecified: Secondary | ICD-10-CM | POA: Diagnosis not present

## 2020-08-17 DIAGNOSIS — E538 Deficiency of other specified B group vitamins: Secondary | ICD-10-CM

## 2020-08-17 DIAGNOSIS — Z0000 Encounter for general adult medical examination without abnormal findings: Secondary | ICD-10-CM

## 2020-08-17 LAB — BASIC METABOLIC PANEL
BUN: 11 mg/dL (ref 6–23)
CO2: 26 mEq/L (ref 19–32)
Calcium: 9.7 mg/dL (ref 8.4–10.5)
Chloride: 104 mEq/L (ref 96–112)
Creatinine, Ser: 0.78 mg/dL (ref 0.40–1.20)
GFR: 72.55 mL/min (ref >60.00)
Glucose, Bld: 78 mg/dL (ref 70–99)
Potassium: 4.2 mEq/L (ref 3.5–5.1)
Sodium: 139 mEq/L (ref 135–145)

## 2020-08-17 LAB — URINALYSIS, ROUTINE W REFLEX MICROSCOPIC
Bilirubin Urine: NEGATIVE
Ketones, ur: NEGATIVE
Nitrite: NEGATIVE
Specific Gravity, Urine: 1.015 (ref 1.000–1.030)
Total Protein, Urine: NEGATIVE
Urine Glucose: NEGATIVE
Urobilinogen, UA: 0.2 (ref 0.0–1.0)
pH: 8 (ref 5.0–8.0)

## 2020-08-17 LAB — LIPID PANEL
Cholesterol: 163 mg/dL (ref 0–200)
HDL: 66.5 mg/dL (ref >39.00)
LDL Cholesterol: 76 mg/dL (ref 0–99)
NonHDL: 96.29
Total CHOL/HDL Ratio: 2
Triglycerides: 100 mg/dL (ref 0.0–149.0)
VLDL: 20 mg/dL (ref 0.0–40.0)

## 2020-08-17 LAB — HEPATIC FUNCTION PANEL
ALT: 14 U/L (ref 0–35)
AST: 18 U/L (ref 0–37)
Albumin: 4.1 g/dL (ref 3.5–5.2)
Alkaline Phosphatase: 47 U/L (ref 39–117)
Bilirubin, Direct: 0.1 mg/dL (ref 0.0–0.3)
Total Bilirubin: 0.6 mg/dL (ref 0.2–1.2)
Total Protein: 7.2 g/dL (ref 6.0–8.3)

## 2020-08-17 LAB — CBC WITH DIFFERENTIAL/PLATELET
Basophils Absolute: 0 10*3/uL (ref 0.0–0.1)
Basophils Relative: 0.7 % (ref 0.0–3.0)
Eosinophils Absolute: 0.2 10*3/uL (ref 0.0–0.7)
Eosinophils Relative: 4.5 % (ref 0.0–5.0)
HCT: 40.7 % (ref 36.0–46.0)
Hemoglobin: 13.3 g/dL (ref 12.0–15.0)
Lymphocytes Relative: 27.1 % (ref 12.0–46.0)
Lymphs Abs: 1.4 10*3/uL (ref 0.7–4.0)
MCHC: 32.7 g/dL (ref 30.0–36.0)
MCV: 92.8 fl (ref 78.0–100.0)
Monocytes Absolute: 0.5 10*3/uL (ref 0.1–1.0)
Monocytes Relative: 9.8 % (ref 3.0–12.0)
Neutro Abs: 2.9 10*3/uL (ref 1.4–7.7)
Neutrophils Relative %: 57.9 % (ref 43.0–77.0)
Platelets: 364 10*3/uL (ref 150.0–400.0)
RBC: 4.39 Mil/uL (ref 3.87–5.11)
RDW: 13.7 % (ref 11.5–15.5)
WBC: 5 10*3/uL (ref 4.0–10.5)

## 2020-08-17 LAB — VITAMIN B12: Vitamin B-12: 663 pg/mL (ref 211–911)

## 2020-08-17 LAB — TSH: TSH: 1.51 u[IU]/mL (ref 0.35–5.50)

## 2020-08-17 LAB — HEMOGLOBIN A1C: Hgb A1c MFr Bld: 5.4 % (ref 4.6–6.5)

## 2020-08-17 LAB — VITAMIN D 25 HYDROXY (VIT D DEFICIENCY, FRACTURES): VITD: 31.88 ng/mL (ref 30.00–100.00)

## 2020-08-24 ENCOUNTER — Encounter: Payer: Self-pay | Admitting: Internal Medicine

## 2020-08-24 ENCOUNTER — Ambulatory Visit (INDEPENDENT_AMBULATORY_CARE_PROVIDER_SITE_OTHER): Payer: Medicare HMO | Admitting: Internal Medicine

## 2020-08-24 ENCOUNTER — Other Ambulatory Visit: Payer: Self-pay

## 2020-08-24 VITALS — BP 126/80 | HR 66 | Ht 65.25 in | Wt 125.0 lb

## 2020-08-24 DIAGNOSIS — Z0001 Encounter for general adult medical examination with abnormal findings: Secondary | ICD-10-CM

## 2020-08-24 DIAGNOSIS — M67441 Ganglion, right hand: Secondary | ICD-10-CM

## 2020-08-24 DIAGNOSIS — D480 Neoplasm of uncertain behavior of bone and articular cartilage: Secondary | ICD-10-CM

## 2020-08-24 DIAGNOSIS — E559 Vitamin D deficiency, unspecified: Secondary | ICD-10-CM | POA: Diagnosis not present

## 2020-08-24 DIAGNOSIS — R8290 Unspecified abnormal findings in urine: Secondary | ICD-10-CM | POA: Insufficient documentation

## 2020-08-24 LAB — URINALYSIS, ROUTINE W REFLEX MICROSCOPIC
Bilirubin Urine: NEGATIVE
Ketones, ur: NEGATIVE
Leukocytes,Ua: NEGATIVE
Nitrite: NEGATIVE
Specific Gravity, Urine: <=1.005 — AB (ref 1.000–1.030)
Total Protein, Urine: NEGATIVE
Urine Glucose: NEGATIVE
Urobilinogen, UA: 0.2 (ref 0.0–1.0)
WBC, UA: NONE SEEN (ref 0–?)
pH: 6 (ref 5.0–8.0)

## 2020-08-24 MED ORDER — LEVOTHYROXINE SODIUM 100 MCG PO TABS: 100.0000 ug | ORAL_TABLET | Freq: Every day | ORAL | 3 refills | Status: DC
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
## 2020-08-24 NOTE — Patient Instructions (Signed)
Please take OTC Vitamin D3 at 2000 units per day, indefinitely  You will be contacted regarding the referral for: Dr Amedeo Plenty for the right 3rd finger  Please continue all other medications as before, and refills have been done if requested.  Please have the pharmacy call with any other refills you may need.  Please continue your efforts at being more active, low cholesterol diet, and weight control.  You are otherwise up to date with prevention measures today.  Please keep your appointments with your specialists as you may have planned  Please go to the LAB at the blood drawing area for the tests to be done - just the urine culture today  You will be contacted by phone if any changes need to be made immediately.  Otherwise, you will receive a letter about your results with an explanation, but please check with MyChart first.  Please remember to sign up for MyChart if you have not done so, as this will be important to you in the future with finding out test results, communicating by private email, and scheduling acute appointments online when needed.  Please make an Appointment to return for your 1 year visit, or sooner if needed, with Lab testing by Appointment as well, to be done about 3-5 days before at the [facility] (so this is for TWO appointments - please see the scheduling desk as you leave)  Due to the ongoing Covid 19 pandemic, our lab now requires an appointment for any labs done at our office.  If you need labs done and do not have an appointment, please call our office ahead of time to schedule before presenting to the lab for your testing.

## 2020-08-25 LAB — URINE CULTURE

## 2020-08-26 ENCOUNTER — Encounter: Payer: Self-pay | Admitting: Internal Medicine

## 2020-08-28 ENCOUNTER — Encounter: Payer: Medicare HMO | Admitting: Internal Medicine

## 2020-08-29 ENCOUNTER — Encounter: Payer: Self-pay | Admitting: Internal Medicine

## 2020-08-29 DIAGNOSIS — Z0001 Encounter for general adult medical examination with abnormal findings: Secondary | ICD-10-CM | POA: Insufficient documentation

## 2020-08-29 DIAGNOSIS — E559 Vitamin D deficiency, unspecified: Secondary | ICD-10-CM | POA: Insufficient documentation

## 2020-08-29 NOTE — Assessment & Plan Note (Signed)
Last vitamin D Lab Results  Component Value Date   VD25OH 31.88 08/17/2020   Low normal, to start oral replacement

## 2020-08-29 NOTE — Assessment & Plan Note (Signed)
Recurrent, now for referral to Dr Amedeo Plenty hand surgury for repeat eval, may need further surgury

## 2020-08-29 NOTE — Assessment & Plan Note (Signed)
Age and sex appropriate education and counseling updated with regular exercise and diet Referrals for preventative services - for hep c screen Immunizations addressed - declines covid booster, flu shot Smoking counseling  - none needed Evidence for depression or other mood disorder - none significant Most recent labs reviewed. I have personally reviewed and have noted: 1) the patient's medical and social history 2) The patient's current medications and supplements 3) The patient's height, weight, and BMI have been recorded in the chart

## 2020-08-29 NOTE — Assessment & Plan Note (Signed)
Etiology unclear, suspect possible UTI, for urine studies and cx, with tx pending results

## 2020-09-09 ENCOUNTER — Other Ambulatory Visit: Payer: Self-pay | Admitting: Internal Medicine

## 2020-09-09 NOTE — Telephone Encounter (Signed)
Please refill as per office routine med refill policy (all routine meds to be refilled for 3 mo or monthly (per pt preference) up to one year from last visit, then month to month grace period for 3 mo, then further med refills will have to be denied) ? ?

## 2020-11-22 DIAGNOSIS — M13849 Other specified arthritis, unspecified hand: Secondary | ICD-10-CM | POA: Diagnosis not present

## 2020-11-22 DIAGNOSIS — R2231 Localized swelling, mass and lump, right upper limb: Secondary | ICD-10-CM | POA: Diagnosis not present

## 2020-11-22 DIAGNOSIS — R2230 Localized swelling, mass and lump, unspecified upper limb: Secondary | ICD-10-CM | POA: Diagnosis not present

## 2020-11-22 DIAGNOSIS — M79644 Pain in right finger(s): Secondary | ICD-10-CM | POA: Diagnosis not present

## 2020-11-28 ENCOUNTER — Encounter: Payer: Self-pay | Admitting: Internal Medicine

## 2020-11-30 NOTE — Progress Notes (Signed)
 Chief Complaint  Patient presents with   Follow-up    CAD    History of Present Illness: 80 yo female with history of anxiety, depression, fibromyalgia, mitral regurgitation, GERD, hyperlipidemia, hypothyroidism, arthritis, CAD and sarcoidosis who is here today for follow up. I saw her as a new consult in February 2021 for the evaluation of abnormal calcium score on recent CT. She had been seen in 2017 by Dr. Nahser for atherosclerosis of the aorta and coronaries noted on CT chest as well as orthostatic hypotension. She had no chest pain at that time. Echo in 2014 with normal LV function with mild MR. She had had no chest pain. Recent calcium scoring per Dr. John with Calcium score of 486 with calcifications noted in the LAD and RCA. She had no complaints when I met her the first time. Echo February 2021 with LVEF=60-65% with mild MR. Cardiac monitor October 2021 with sinus rhythm with PVCs, 2 runs NSVT (4 and 7 beats). I saw her in the office in November 2021 and she c/o hypotension at home while in hot environments, after meals and when having bowel movements. No syncope. She did not feel her heart race before these episodes. HR in the 80s during hypotensive episodes down into the 80s systolic. Her events were felt to be vasovagal.   She is here today for follow up. The patient denies any chest pain, dyspnea, palpitations, lower extremity edema, orthopnea, PND, dizziness, near syncope or syncope.   Primary Care Physician: John, James W, MD  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   Sarcoidosis 10/17/2006     Past Surgical History:  Procedure Laterality Date   CATARACT EXTRACTION     bilateral   COLONOSCOPY     EXCISION METACARPAL MASS Right 07/24/2015   Procedure: EXCISION OF RIGHT LONG FINGER  MASS;  Surgeon: David Thompson, MD;  Location: [facility];  Service: Orthopedics;  Laterality: Right;   LYMPH NODE BIOPSY  1968   PELVIC FRACTURE SURGERY  03-04-12    [facility]-surgery at [facility]   UPPER GASTROINTESTINAL ENDOSCOPY      Current Outpatient Medications  Medication Sig Dispense Refill   aspirin (ASPIRIN 81) 81 MG EC tablet Take 1 tablet (81 mg total) by mouth daily. Swallow whole. 30 tablet 12   BIOTIN PO Take 1 tablet by mouth daily.     Calcium-Vitamin D-Vitamin K (CALCIUM SOFT CHEWS PO) Take 3 tablets by mouth daily.     cyanocobalamin (,VITAMIN B-12,) 1000 MCG/ML injection INJECT 1 ML INTRAMUSCULARLY MONTHLY AS DIRECTED 3 mL 5   dextromethorphan-guaiFENesin (MUCINEX DM) 30-600 MG per 12 hr tablet Take 1 tablet by mouth 2 (two) times daily as needed for cough.     docusate sodium (COLACE) 100 MG capsule Take 100 mg by mouth daily as needed for mild constipation.     Ferrous Sulfate (FE-CAPS PO) Take 1 capsule by mouth 3 (three) times a week.     FOLIC ACID PO Take 1 tablet by mouth 3 (three) times a week.     levothyroxine (SYNTHROID) 100 MCG tablet Take 1 tablet (100 mcg total)   by mouth daily. 90 tablet 3   Magnesium 400 MG CAPS Take 1 tablet by mouth every morning.     magnesium oxide (MAG-OX) 400 MG tablet Take 400 mg by mouth 3 (three) times a week.     NONFORMULARY OR COMPOUNDED ITEM 1 tablet daily.     NONFORMULARY OR COMPOUNDED ITEM 1 tablet daily.     rosuvastatin (CRESTOR) 10 MG tablet TAKE 1 TABLET EVERY DAY 90 tablet 1   SYRINGE-NEEDLE, DISP, 3 ML (B-D 3CC LUER-LOK SYR 25GX1") 25G X 1" 3 ML MISC USE FOR B12 SHOTS ONCE A MONTH 100 each 1   No current facility-administered medications for this visit.    Allergies  Allergen Reactions   Adhesive [Tape]      Causes skin breakdown   Aspartame And Phenylalanine Other (See Comments)    GI upset,&pain   Pravastatin Sodium     REACTION: GI upset   Other    Pseudoephedrine Other (See Comments)    "hyperactive"    Social History   Socioeconomic History   Marital status: Married    Spouse name: Not on file   Number of children: 2   Years of education: Not on file   Highest education level: Not on file  Occupational History   Occupation: psychology-counselor  Tobacco Use   Smoking status: Never   Smokeless tobacco: Never  Vaping Use   Vaping Use: Never used  Substance and Sexual Activity   Alcohol use: No   Drug use: No   Sexual activity: Yes  Other Topics Concern   Not on file  Social History Narrative   Right handed   One story home   Social Determinants of Health   Financial Resource Strain: Low Risk    Difficulty of Paying Living Expenses: Not hard at all  Food Insecurity: No Food Insecurity   Worried About Running Out of Food in the Last Year: Never true   Ran Out of Food in the Last Year: Never true  Transportation Needs: No Transportation Needs   Lack of Transportation (Medical): No   Lack of Transportation (Non-Medical): No  Physical Activity: Sufficiently Active   Days of Exercise per Week: 5 days   Minutes of Exercise per Session: 30 min  Stress: No Stress Concern Present   Feeling of Stress : Not at all  Social Connections: Socially Integrated   Frequency of Communication with Friends and Family: More than three times a week   Frequency of Social Gatherings with Friends and Family: More than three times a week   Attends Religious Services: More than 4 times per year   Active Member of Clubs or Organizations: Yes   Attends Club or Organization Meetings: More than 4 times per year   Marital Status: Married  Intimate Partner Violence: Not on file    Family History  Problem Relation Age of Onset   Hypertension Mother    Anxiety disorder Mother    Heart  disease Mother    Rheum arthritis Mother    Alcohol abuse Father    Anxiety disorder Father    Heart disease Father    Rheum arthritis Father    Celiac disease Other        nieces and nephews   Breast cancer Maternal Aunt    Uterine cancer Sister    Heart disease Brother    Colon cancer Neg Hx     Review of Systems:  As stated in the HPI and otherwise negative.   BP   106/60   Pulse 76   Ht 5' 5.5" (1.664 m)   Wt 124 lb (56.2 kg)   SpO2 98%   BMI 20.32 kg/m   Physical Examination:  General: Well developed, well nourished, NAD  HEENT: OP clear, mucus membranes moist  SKIN: warm, dry. No rashes. Neuro: No focal deficits  Musculoskeletal: Muscle strength 5/5 all ext  Psychiatric: Mood and affect normal  Neck: No JVD, no carotid bruits, no thyromegaly, no lymphadenopathy.  Lungs:Clear bilaterally, no wheezes, rhonci, crackles Cardiovascular: Regular rate and rhythm. No murmurs, gallops or rubs. Abdomen:Soft. Bowel sounds present. Non-tender.  Extremities: No lower extremity edema. Pulses are 2 + in the bilateral DP/PT.  EKG:  EKG is ordered today. The ekg ordered today demonstrates Sinus  Echo February 2021: 1. Left ventricular ejection fraction, by estimation, is 60 to 65%. The  left ventricle has normal function. The left ventricle has no regional  wall motion abnormalities. There is mild asymmetric left ventricular  hypertrophy of the anteroseptum segment.  Left ventricular diastolic parameters are consistent with Grade I  diastolic dysfunction (impaired relaxation). The average left ventricular  global longitudinal strain is -19.1 %.   2. Right ventricular systolic function is normal. The right ventricular  size is normal. There is normal pulmonary artery systolic pressure.   3. The mitral valve is normal in structure and function. Mild mitral  valve regurgitation. No evidence of mitral stenosis.   4. The aortic valve is normal in structure and function. Aortic valve   regurgitation is not visualized. No aortic stenosis is present.   5. The inferior vena cava is normal in size with greater than 50%  respiratory variability, suggesting right atrial pressure of 3 mmHg.   Recent Labs: 08/17/2020: ALT 14; BUN 11; Creatinine, Ser 0.78; Hemoglobin 13.3; Platelets 364.0; Potassium 4.2; Sodium 139; TSH 1.51   Lipid Panel    Component Value Date/Time   CHOL 163 08/17/2020 0824   TRIG 100.0 08/17/2020 0824   HDL 66.50 08/17/2020 0824   CHOLHDL 2 08/17/2020 0824   VLDL 20.0 08/17/2020 0824   LDLCALC 76 08/17/2020 0824   LDLDIRECT 127.0 11/11/2014 1616     Wt Readings from Last 3 Encounters:  12/01/20 124 lb (56.2 kg)  08/24/20 125 lb (56.7 kg)  02/22/20 126 lb 12.8 oz (57.5 kg)     Other studies Reviewed: Additional studies/ records that were reviewed today include: office notes, EKG   Assessment and Plan:   1. Mitral regurgitation: Mild MR by echo in February 2021. Repeat echo in February 2023.   2. CAD without angina: Coronary calcification noted on chest CT in 2014 and on most recent calcium score by CT. She has no chest pain.Continue ASA and statin  3. PVCs: Cardiac monitor October 2021 with sinus with PVCs, several short runs of VT (4 and 7 beats). LV function is normal. No therapy at this time  Current medicines are reviewed at length with the patient today.  The patient does not have concerns regarding medicines.  The following changes have been made:  no change  Labs/ tests ordered today include:   Orders Placed This Encounter  Procedures   EKG 12-Lead   ECHOCARDIOGRAM COMPLETE     Disposition:   F/U with me in 12 months.    Signed, Lauree Chandler, MD 12/01/2020 9:45 AM    [address], [place], [place]  [address] Phone: [phone]; Fax: [phone]

## 2020-12-01 ENCOUNTER — Encounter: Payer: Self-pay | Admitting: Cardiovascular Disease

## 2020-12-01 ENCOUNTER — Ambulatory Visit: Payer: Medicare HMO | Admitting: Cardiovascular Disease

## 2020-12-01 ENCOUNTER — Other Ambulatory Visit: Payer: Self-pay

## 2020-12-01 VITALS — BP 106/60 | HR 76 | Ht 65.5 in | Wt 124.0 lb

## 2020-12-01 DIAGNOSIS — I493 Ventricular premature depolarization: Secondary | ICD-10-CM

## 2020-12-01 DIAGNOSIS — I340 Nonrheumatic mitral (valve) insufficiency: Secondary | ICD-10-CM | POA: Diagnosis not present

## 2020-12-01 DIAGNOSIS — I2510 Atherosclerotic heart disease of native coronary artery without angina pectoris: Secondary | ICD-10-CM

## 2020-12-01 NOTE — Patient Instructions (Addendum)
Medication Instructions:  Your physician recommends that you continue on your current medications as directed. Please refer to the Current Medication list given to you today.  *If you need a refill on your cardiac medications before your next appointment, please call your pharmacy*   Lab Work: None  If you have labs (blood work) drawn today and your tests are completely normal, you will receive your results only by: [place] (if you have MyChart) OR A paper copy in the mail If you have any lab test that is abnormal or we need to change your treatment, we will call you to review the results.   Testing/Procedures: DUE IN FEBRUARY 2023 Your physician has requested that you have an echocardiogram. Echocardiography is a painless test that uses sound waves to create images of your heart. It provides your doctor with information about the size and shape of your heart and how well your heart's chambers and valves are working. This procedure takes approximately one hour. There are no restrictions for this procedure.    Follow-Up: At [facility], you and your health needs are our priority.  As part of our continuing mission to provide you with exceptional heart care, we have created designated Provider Care Teams.  These Care Teams include your primary Cardiologist (physician) and Advanced Practice Providers (APPs -  Physician Assistants and Nurse Practitioners) who all work together to provide you with the care you need, when you need it.  Your next appointment:   1 year(s)  The format for your next appointment:   In Person  Provider:   Lauree Chandler, MD

## 2020-12-13 DIAGNOSIS — M461 Sacroiliitis, not elsewhere classified: Secondary | ICD-10-CM | POA: Diagnosis not present

## 2020-12-13 DIAGNOSIS — M9902 Segmental and somatic dysfunction of thoracic region: Secondary | ICD-10-CM | POA: Diagnosis not present

## 2020-12-13 DIAGNOSIS — M9901 Segmental and somatic dysfunction of cervical region: Secondary | ICD-10-CM | POA: Diagnosis not present

## 2020-12-13 DIAGNOSIS — M9905 Segmental and somatic dysfunction of pelvic region: Secondary | ICD-10-CM | POA: Diagnosis not present

## 2020-12-13 DIAGNOSIS — M5489 Other dorsalgia: Secondary | ICD-10-CM | POA: Diagnosis not present

## 2020-12-19 DIAGNOSIS — M9901 Segmental and somatic dysfunction of cervical region: Secondary | ICD-10-CM | POA: Diagnosis not present

## 2020-12-19 DIAGNOSIS — M9902 Segmental and somatic dysfunction of thoracic region: Secondary | ICD-10-CM | POA: Diagnosis not present

## 2020-12-19 DIAGNOSIS — M9905 Segmental and somatic dysfunction of pelvic region: Secondary | ICD-10-CM | POA: Diagnosis not present

## 2020-12-19 DIAGNOSIS — M5489 Other dorsalgia: Secondary | ICD-10-CM | POA: Diagnosis not present

## 2020-12-19 DIAGNOSIS — M461 Sacroiliitis, not elsewhere classified: Secondary | ICD-10-CM | POA: Diagnosis not present

## 2020-12-27 DIAGNOSIS — M9902 Segmental and somatic dysfunction of thoracic region: Secondary | ICD-10-CM | POA: Diagnosis not present

## 2020-12-27 DIAGNOSIS — M461 Sacroiliitis, not elsewhere classified: Secondary | ICD-10-CM | POA: Diagnosis not present

## 2020-12-27 DIAGNOSIS — M5489 Other dorsalgia: Secondary | ICD-10-CM | POA: Diagnosis not present

## 2020-12-27 DIAGNOSIS — M9901 Segmental and somatic dysfunction of cervical region: Secondary | ICD-10-CM | POA: Diagnosis not present

## 2020-12-27 DIAGNOSIS — M9905 Segmental and somatic dysfunction of pelvic region: Secondary | ICD-10-CM | POA: Diagnosis not present

## 2020-12-29 DIAGNOSIS — M461 Sacroiliitis, not elsewhere classified: Secondary | ICD-10-CM | POA: Diagnosis not present

## 2020-12-29 DIAGNOSIS — M9902 Segmental and somatic dysfunction of thoracic region: Secondary | ICD-10-CM | POA: Diagnosis not present

## 2020-12-29 DIAGNOSIS — M9901 Segmental and somatic dysfunction of cervical region: Secondary | ICD-10-CM | POA: Diagnosis not present

## 2020-12-29 DIAGNOSIS — M5489 Other dorsalgia: Secondary | ICD-10-CM | POA: Diagnosis not present

## 2020-12-29 DIAGNOSIS — M9905 Segmental and somatic dysfunction of pelvic region: Secondary | ICD-10-CM | POA: Diagnosis not present

## 2021-01-02 DIAGNOSIS — M461 Sacroiliitis, not elsewhere classified: Secondary | ICD-10-CM | POA: Diagnosis not present

## 2021-01-02 DIAGNOSIS — M9901 Segmental and somatic dysfunction of cervical region: Secondary | ICD-10-CM | POA: Diagnosis not present

## 2021-01-02 DIAGNOSIS — M9905 Segmental and somatic dysfunction of pelvic region: Secondary | ICD-10-CM | POA: Diagnosis not present

## 2021-01-02 DIAGNOSIS — M5489 Other dorsalgia: Secondary | ICD-10-CM | POA: Diagnosis not present

## 2021-01-02 DIAGNOSIS — M9902 Segmental and somatic dysfunction of thoracic region: Secondary | ICD-10-CM | POA: Diagnosis not present

## 2021-01-05 DIAGNOSIS — M9905 Segmental and somatic dysfunction of pelvic region: Secondary | ICD-10-CM | POA: Diagnosis not present

## 2021-01-05 DIAGNOSIS — M9902 Segmental and somatic dysfunction of thoracic region: Secondary | ICD-10-CM | POA: Diagnosis not present

## 2021-01-05 DIAGNOSIS — M9901 Segmental and somatic dysfunction of cervical region: Secondary | ICD-10-CM | POA: Diagnosis not present

## 2021-01-05 DIAGNOSIS — M461 Sacroiliitis, not elsewhere classified: Secondary | ICD-10-CM | POA: Diagnosis not present

## 2021-01-05 DIAGNOSIS — M5489 Other dorsalgia: Secondary | ICD-10-CM | POA: Diagnosis not present

## 2021-01-10 DIAGNOSIS — M9905 Segmental and somatic dysfunction of pelvic region: Secondary | ICD-10-CM | POA: Diagnosis not present

## 2021-01-10 DIAGNOSIS — M461 Sacroiliitis, not elsewhere classified: Secondary | ICD-10-CM | POA: Diagnosis not present

## 2021-01-10 DIAGNOSIS — M9901 Segmental and somatic dysfunction of cervical region: Secondary | ICD-10-CM | POA: Diagnosis not present

## 2021-01-10 DIAGNOSIS — M5489 Other dorsalgia: Secondary | ICD-10-CM | POA: Diagnosis not present

## 2021-01-10 DIAGNOSIS — M9902 Segmental and somatic dysfunction of thoracic region: Secondary | ICD-10-CM | POA: Diagnosis not present

## 2021-01-12 DIAGNOSIS — M461 Sacroiliitis, not elsewhere classified: Secondary | ICD-10-CM | POA: Diagnosis not present

## 2021-01-12 DIAGNOSIS — M5489 Other dorsalgia: Secondary | ICD-10-CM | POA: Diagnosis not present

## 2021-01-12 DIAGNOSIS — M9901 Segmental and somatic dysfunction of cervical region: Secondary | ICD-10-CM | POA: Diagnosis not present

## 2021-01-12 DIAGNOSIS — M9905 Segmental and somatic dysfunction of pelvic region: Secondary | ICD-10-CM | POA: Diagnosis not present

## 2021-01-12 DIAGNOSIS — M9902 Segmental and somatic dysfunction of thoracic region: Secondary | ICD-10-CM | POA: Diagnosis not present

## 2021-01-17 DIAGNOSIS — M9905 Segmental and somatic dysfunction of pelvic region: Secondary | ICD-10-CM | POA: Diagnosis not present

## 2021-01-17 DIAGNOSIS — M9901 Segmental and somatic dysfunction of cervical region: Secondary | ICD-10-CM | POA: Diagnosis not present

## 2021-01-17 DIAGNOSIS — M461 Sacroiliitis, not elsewhere classified: Secondary | ICD-10-CM | POA: Diagnosis not present

## 2021-01-17 DIAGNOSIS — M9902 Segmental and somatic dysfunction of thoracic region: Secondary | ICD-10-CM | POA: Diagnosis not present

## 2021-01-24 DIAGNOSIS — M9901 Segmental and somatic dysfunction of cervical region: Secondary | ICD-10-CM | POA: Diagnosis not present

## 2021-01-24 DIAGNOSIS — M9902 Segmental and somatic dysfunction of thoracic region: Secondary | ICD-10-CM | POA: Diagnosis not present

## 2021-01-24 DIAGNOSIS — M9905 Segmental and somatic dysfunction of pelvic region: Secondary | ICD-10-CM | POA: Diagnosis not present

## 2021-01-24 DIAGNOSIS — M461 Sacroiliitis, not elsewhere classified: Secondary | ICD-10-CM | POA: Diagnosis not present

## 2021-01-25 ENCOUNTER — Encounter: Payer: Self-pay | Admitting: Internal Medicine

## 2021-01-25 MED ORDER — FLUOXETINE HCL 10 MG PO CAPS: 10.0000 mg | ORAL_CAPSULE | Freq: Every day | ORAL | 3 refills | Status: DC

## 2021-01-26 DIAGNOSIS — M9905 Segmental and somatic dysfunction of pelvic region: Secondary | ICD-10-CM | POA: Diagnosis not present

## 2021-01-26 DIAGNOSIS — M9901 Segmental and somatic dysfunction of cervical region: Secondary | ICD-10-CM | POA: Diagnosis not present

## 2021-01-26 DIAGNOSIS — M9902 Segmental and somatic dysfunction of thoracic region: Secondary | ICD-10-CM | POA: Diagnosis not present

## 2021-01-26 DIAGNOSIS — M461 Sacroiliitis, not elsewhere classified: Secondary | ICD-10-CM | POA: Diagnosis not present

## 2021-02-07 DIAGNOSIS — M9905 Segmental and somatic dysfunction of pelvic region: Secondary | ICD-10-CM | POA: Diagnosis not present

## 2021-02-07 DIAGNOSIS — M461 Sacroiliitis, not elsewhere classified: Secondary | ICD-10-CM | POA: Diagnosis not present

## 2021-02-07 DIAGNOSIS — M9902 Segmental and somatic dysfunction of thoracic region: Secondary | ICD-10-CM | POA: Diagnosis not present

## 2021-02-07 DIAGNOSIS — M9901 Segmental and somatic dysfunction of cervical region: Secondary | ICD-10-CM | POA: Diagnosis not present

## 2021-02-16 DIAGNOSIS — M9901 Segmental and somatic dysfunction of cervical region: Secondary | ICD-10-CM | POA: Diagnosis not present

## 2021-02-16 DIAGNOSIS — M461 Sacroiliitis, not elsewhere classified: Secondary | ICD-10-CM | POA: Diagnosis not present

## 2021-02-16 DIAGNOSIS — M9905 Segmental and somatic dysfunction of pelvic region: Secondary | ICD-10-CM | POA: Diagnosis not present

## 2021-02-16 DIAGNOSIS — M9902 Segmental and somatic dysfunction of thoracic region: Secondary | ICD-10-CM | POA: Diagnosis not present

## 2021-02-20 ENCOUNTER — Other Ambulatory Visit (HOSPITAL_COMMUNITY): Payer: Medicare HMO

## 2021-02-21 DIAGNOSIS — M9902 Segmental and somatic dysfunction of thoracic region: Secondary | ICD-10-CM | POA: Diagnosis not present

## 2021-02-21 DIAGNOSIS — M9901 Segmental and somatic dysfunction of cervical region: Secondary | ICD-10-CM | POA: Diagnosis not present

## 2021-02-21 DIAGNOSIS — M461 Sacroiliitis, not elsewhere classified: Secondary | ICD-10-CM | POA: Diagnosis not present

## 2021-02-21 DIAGNOSIS — M9905 Segmental and somatic dysfunction of pelvic region: Secondary | ICD-10-CM | POA: Diagnosis not present

## 2021-02-23 DIAGNOSIS — M9902 Segmental and somatic dysfunction of thoracic region: Secondary | ICD-10-CM | POA: Diagnosis not present

## 2021-02-23 DIAGNOSIS — M461 Sacroiliitis, not elsewhere classified: Secondary | ICD-10-CM | POA: Diagnosis not present

## 2021-02-23 DIAGNOSIS — M9901 Segmental and somatic dysfunction of cervical region: Secondary | ICD-10-CM | POA: Diagnosis not present

## 2021-02-23 DIAGNOSIS — M9905 Segmental and somatic dysfunction of pelvic region: Secondary | ICD-10-CM | POA: Diagnosis not present

## 2021-02-25 ENCOUNTER — Encounter: Payer: Self-pay | Admitting: Internal Medicine

## 2021-02-26 ENCOUNTER — Ambulatory Visit (INDEPENDENT_AMBULATORY_CARE_PROVIDER_SITE_OTHER): Payer: Medicare Other

## 2021-02-26 DIAGNOSIS — Z0000 Encounter for general adult medical examination without abnormal findings: Secondary | ICD-10-CM | POA: Diagnosis not present

## 2021-02-26 MED ORDER — LEVOTHYROXINE SODIUM 100 MCG PO TABS: 100.0000 ug | ORAL_TABLET | Freq: Every day | ORAL | 2 refills | Status: DC

## 2021-02-26 NOTE — Patient Instructions (Signed)
[name] , Thank you for taking time to come for your Medicare Wellness Visit. I appreciate your ongoing commitment to your health goals. Please review the following plan we discussed and let me know if I can assist you in the future.   Screening recommendations/referrals: Colonoscopy: 08/19/2011; due every 10 years Mammogram: 05/25/2020; due every 1-2 years Bone Density: 12/04/2018; due every 2-3 years Recommended yearly ophthalmology/optometry visit for glaucoma screening and checkup Recommended yearly dental visit for hygiene and checkup  Vaccinations: Influenza vaccine: 10/2020 at [facility] Pneumococcal vaccine: 09/22/2008, 09/29/2012 Tdap vaccine: 12/04/2018; due every 10 years Shingles vaccine: 02/01/2018, 07/06/2018   Covid-19: 02/04/2019, 02/25/2019, 10/27/2019  Advanced directives: Yes; documents on file  Conditions/risks identified: Yes; Client understands the importance of follow-up with providers by attending scheduled visits and discussed goals to eat healthier, increase physical activity, exercise the brain, socialize more, get enough sleep and make time for laughter.  Next appointment: Please schedule your next Medicare Wellness Visit with your Nurse Health Advisor in 1 year by calling [phone].   Preventive Care 57 Years and Older, Female Preventive care refers to lifestyle choices and visits with your health care provider that can promote health and wellness. What does preventive care include? A yearly physical exam. This is also called an annual well check. Dental exams once or twice a year. Routine eye exams. Ask your health care provider how often you should have your eyes checked. Personal lifestyle choices, including: Daily care of your teeth and gums. Regular physical activity. Eating a healthy diet. Avoiding tobacco and drug use. Limiting alcohol use. Practicing safe sex. Taking low-dose aspirin every day. Taking vitamin and mineral supplements as recommended  by your health care provider. What happens during an annual well check? The services and screenings done by your health care provider during your annual well check will depend on your age, overall health, lifestyle risk factors, and family history of disease. Counseling  Your health care provider may ask you questions about your: Alcohol use. Tobacco use. Drug use. Emotional well-being. Home and relationship well-being. Sexual activity. Eating habits. History of falls. Memory and ability to understand (cognition). Work and work Statistician. Reproductive health. Screening  You may have the following tests or measurements: Height, weight, and BMI. Blood pressure. Lipid and cholesterol levels. These may be checked every 5 years, or more frequently if you are over 48 years old. Skin check. Lung cancer screening. You may have this screening every year starting at age 65 if you have a 30-pack-year history of smoking and currently smoke or have quit within the past 15 years. Fecal occult blood test (FOBT) of the stool. You may have this test every year starting at age 42. Flexible sigmoidoscopy or colonoscopy. You may have a sigmoidoscopy every 5 years or a colonoscopy every 10 years starting at age 80. Hepatitis C blood test. Hepatitis B blood test. Sexually transmitted disease (STD) testing. Diabetes screening. This is done by checking your blood sugar (glucose) after you have not eaten for a while (fasting). You may have this done every 1-3 years. Bone density scan. This is done to screen for osteoporosis. You may have this done starting at age 68. Mammogram. This may be done every 1-2 years. Talk to your health care provider about how often you should have regular mammograms. Talk with your health care provider about your test results, treatment options, and if necessary, the need for more tests. Vaccines  Your health care provider may recommend certain vaccines, such as: Influenza  vaccine. This is recommended every year. Tetanus, diphtheria, and acellular pertussis (Tdap, Td) vaccine. You may need a Td booster every 10 years. Zoster vaccine. You may need this after age 79. Pneumococcal 13-valent conjugate (PCV13) vaccine. One dose is recommended after age 47. Pneumococcal polysaccharide (PPSV23) vaccine. One dose is recommended after age 81. Talk to your health care provider about which screenings and vaccines you need and how often you need them. This information is not intended to replace advice given to you by your health care provider. Make sure you discuss any questions you have with your health care provider. Document Released: 01/27/2015 Document Revised: 09/20/2015 Document Reviewed: 11/01/2014 Elsevier Interactive Patient Education  2017 [place] Prevention in the Home Falls can cause injuries. They can happen to people of all ages. There are many things you can do to make your home safe and to help prevent falls. What can I do on the outside of my home? Regularly fix the edges of walkways and driveways and fix any cracks. Remove anything that might make you trip as you walk through a door, such as a raised step or threshold. Trim any bushes or trees on the path to your home. Use bright outdoor lighting. Clear any walking paths of anything that might make someone trip, such as rocks or tools. Regularly check to see if handrails are loose or broken. Make sure that both sides of any steps have handrails. Any raised decks and porches should have guardrails on the edges. Have any leaves, snow, or ice cleared regularly. Use sand or salt on walking paths during winter. Clean up any spills in your garage right away. This includes oil or grease spills. What can I do in the bathroom? Use night lights. Install grab bars by the toilet and in the tub and shower. Do not use towel bars as grab bars. Use non-skid mats or decals in the tub or shower. If you  need to sit down in the shower, use a plastic, non-slip stool. Keep the floor dry. Clean up any water that spills on the floor as soon as it happens. Remove soap buildup in the tub or shower regularly. Attach bath mats securely with double-sided non-slip rug tape. Do not have throw rugs and other things on the floor that can make you trip. What can I do in the bedroom? Use night lights. Make sure that you have a light by your bed that is easy to reach. Do not use any sheets or blankets that are too big for your bed. They should not hang down onto the floor. Have a firm chair that has side arms. You can use this for support while you get dressed. Do not have throw rugs and other things on the floor that can make you trip. What can I do in the kitchen? Clean up any spills right away. Avoid walking on wet floors. Keep items that you use a lot in easy-to-reach places. If you need to reach something above you, use a strong step stool that has a grab bar. Keep electrical cords out of the way. Do not use floor polish or wax that makes floors slippery. If you must use wax, use non-skid floor wax. Do not have throw rugs and other things on the floor that can make you trip. What can I do with my stairs? Do not leave any items on the stairs. Make sure that there are handrails on both sides of the stairs and use them. Fix handrails  that are broken or loose. Make sure that handrails are as long as the stairways. Check any carpeting to make sure that it is firmly attached to the stairs. Fix any carpet that is loose or worn. Avoid having throw rugs at the top or bottom of the stairs. If you do have throw rugs, attach them to the floor with carpet tape. Make sure that you have a light switch at the top of the stairs and the bottom of the stairs. If you do not have them, ask someone to add them for you. What else can I do to help prevent falls? Wear shoes that: Do not have high heels. Have rubber  bottoms. Are comfortable and fit you well. Are closed at the toe. Do not wear sandals. If you use a stepladder: Make sure that it is fully opened. Do not climb a closed stepladder. Make sure that both sides of the stepladder are locked into place. Ask someone to hold it for you, if possible. Clearly mark and make sure that you can see: Any grab bars or handrails. First and last steps. Where the edge of each step is. Use tools that help you move around (mobility aids) if they are needed. These include: Canes. Walkers. Scooters. Crutches. Turn on the lights when you go into a dark area. Replace any light bulbs as soon as they burn out. Set up your furniture so you have a clear path. Avoid moving your furniture around. If any of your floors are uneven, fix them. If there are any pets around you, be aware of where they are. Review your medicines with your doctor. Some medicines can make you feel dizzy. This can increase your chance of falling. Ask your doctor what other things that you can do to help prevent falls. This information is not intended to replace advice given to you by your health care provider. Make sure you discuss any questions you have with your health care provider. Document Released: 10/27/2008 Document Revised: 06/08/2015 Document Reviewed: 02/04/2014 Elsevier Interactive Patient Education  2017 [employer].
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
## 2021-02-28 ENCOUNTER — Other Ambulatory Visit: Payer: Self-pay | Admitting: Internal Medicine

## 2021-02-28 DIAGNOSIS — M9901 Segmental and somatic dysfunction of cervical region: Secondary | ICD-10-CM | POA: Diagnosis not present

## 2021-02-28 DIAGNOSIS — M9905 Segmental and somatic dysfunction of pelvic region: Secondary | ICD-10-CM | POA: Diagnosis not present

## 2021-02-28 DIAGNOSIS — M9902 Segmental and somatic dysfunction of thoracic region: Secondary | ICD-10-CM | POA: Diagnosis not present

## 2021-02-28 DIAGNOSIS — M461 Sacroiliitis, not elsewhere classified: Secondary | ICD-10-CM | POA: Diagnosis not present

## 2021-03-04 ENCOUNTER — Encounter: Payer: Self-pay | Admitting: Internal Medicine

## 2021-03-06 DIAGNOSIS — M9902 Segmental and somatic dysfunction of thoracic region: Secondary | ICD-10-CM | POA: Diagnosis not present

## 2021-03-06 DIAGNOSIS — M9905 Segmental and somatic dysfunction of pelvic region: Secondary | ICD-10-CM | POA: Diagnosis not present

## 2021-03-06 DIAGNOSIS — M461 Sacroiliitis, not elsewhere classified: Secondary | ICD-10-CM | POA: Diagnosis not present

## 2021-03-06 DIAGNOSIS — M9901 Segmental and somatic dysfunction of cervical region: Secondary | ICD-10-CM | POA: Diagnosis not present

## 2021-03-06 NOTE — Telephone Encounter (Signed)
Ok to [employer] to [employer] for her and husband that their low B12 is due to perniscious anemia , not low b12 due to low nutrition

## 2021-03-14 DIAGNOSIS — M9901 Segmental and somatic dysfunction of cervical region: Secondary | ICD-10-CM | POA: Diagnosis not present

## 2021-03-14 DIAGNOSIS — M461 Sacroiliitis, not elsewhere classified: Secondary | ICD-10-CM | POA: Diagnosis not present

## 2021-03-14 DIAGNOSIS — M9905 Segmental and somatic dysfunction of pelvic region: Secondary | ICD-10-CM | POA: Diagnosis not present

## 2021-03-14 DIAGNOSIS — M9902 Segmental and somatic dysfunction of thoracic region: Secondary | ICD-10-CM | POA: Diagnosis not present

## 2021-03-16 DIAGNOSIS — M9905 Segmental and somatic dysfunction of pelvic region: Secondary | ICD-10-CM | POA: Diagnosis not present

## 2021-03-16 DIAGNOSIS — M9902 Segmental and somatic dysfunction of thoracic region: Secondary | ICD-10-CM | POA: Diagnosis not present

## 2021-03-16 DIAGNOSIS — M9901 Segmental and somatic dysfunction of cervical region: Secondary | ICD-10-CM | POA: Diagnosis not present

## 2021-03-16 DIAGNOSIS — M461 Sacroiliitis, not elsewhere classified: Secondary | ICD-10-CM | POA: Diagnosis not present

## 2021-03-21 DIAGNOSIS — M9901 Segmental and somatic dysfunction of cervical region: Secondary | ICD-10-CM | POA: Diagnosis not present

## 2021-03-21 DIAGNOSIS — M9902 Segmental and somatic dysfunction of thoracic region: Secondary | ICD-10-CM | POA: Diagnosis not present

## 2021-03-21 DIAGNOSIS — M461 Sacroiliitis, not elsewhere classified: Secondary | ICD-10-CM | POA: Diagnosis not present

## 2021-03-21 DIAGNOSIS — M9905 Segmental and somatic dysfunction of pelvic region: Secondary | ICD-10-CM | POA: Diagnosis not present

## 2021-03-23 ENCOUNTER — Other Ambulatory Visit: Payer: Self-pay | Admitting: *Deleted

## 2021-03-23 ENCOUNTER — Encounter: Payer: Self-pay | Admitting: Internal Medicine

## 2021-03-23 MED ORDER — ROSUVASTATIN CALCIUM 10 MG PO TABS: 10.0000 mg | ORAL_TABLET | Freq: Every day | ORAL | 1 refills | Status: DC

## 2021-03-27 DIAGNOSIS — F4320 Adjustment disorder, unspecified: Secondary | ICD-10-CM | POA: Diagnosis not present

## 2021-03-28 DIAGNOSIS — M9901 Segmental and somatic dysfunction of cervical region: Secondary | ICD-10-CM | POA: Diagnosis not present

## 2021-03-28 DIAGNOSIS — M461 Sacroiliitis, not elsewhere classified: Secondary | ICD-10-CM | POA: Diagnosis not present

## 2021-03-28 DIAGNOSIS — M9905 Segmental and somatic dysfunction of pelvic region: Secondary | ICD-10-CM | POA: Diagnosis not present

## 2021-03-28 DIAGNOSIS — M9902 Segmental and somatic dysfunction of thoracic region: Secondary | ICD-10-CM | POA: Diagnosis not present

## 2021-04-01 DIAGNOSIS — N390 Urinary tract infection, site not specified: Secondary | ICD-10-CM | POA: Diagnosis not present

## 2021-04-01 DIAGNOSIS — U071 COVID-19: Secondary | ICD-10-CM | POA: Diagnosis not present

## 2021-04-01 DIAGNOSIS — E785 Hyperlipidemia, unspecified: Secondary | ICD-10-CM | POA: Diagnosis not present

## 2021-04-01 DIAGNOSIS — R030 Elevated blood-pressure reading, without diagnosis of hypertension: Secondary | ICD-10-CM | POA: Diagnosis not present

## 2021-04-02 ENCOUNTER — Telehealth: Payer: Self-pay | Admitting: Internal Medicine

## 2021-04-02 NOTE — Telephone Encounter (Signed)
Patient calling back in to check status of previous message ? ?Please fu w/ patient [phone] ?

## 2021-04-02 NOTE — Telephone Encounter (Signed)
Pt states she was treated for covid and an uti at a UC not affiliated w/ [facility] ? ?Pt states she was prescribed cefdinir for the uti, after taking the medication pt experienced stomach cramps and vomiting ? ?Pt requesting a cb to discuss if another antibiotic can be prescribed  ? ? ?

## 2021-04-02 NOTE — Telephone Encounter (Signed)
Spoke with patient's daughter and she states that patient has had vomiting while taking cefdinir and paxlovid. Patient stopped taking the cefdinir and vomiting has stopped. Advised patient and patient's daughter that we are unable to advise being that we did not see nor treat the patient and results are not in [place]. Contacted UC that patient was seen at to attempt to get assistance for patient. Patient transferred over to UC line.  ?

## 2021-04-04 ENCOUNTER — Encounter: Payer: Self-pay | Admitting: Internal Medicine

## 2021-04-05 ENCOUNTER — Inpatient Hospital Stay: Admission: RE | Admit: 2021-04-05 | Payer: Self-pay | Source: Ambulatory Visit

## 2021-04-05 NOTE — Telephone Encounter (Signed)
Spoke with patient's daughter and she states that patient will be seen by a [facility] urgent care today.  ?

## 2021-04-11 ENCOUNTER — Encounter: Payer: Self-pay | Admitting: Family Medicine

## 2021-04-11 ENCOUNTER — Ambulatory Visit (INDEPENDENT_AMBULATORY_CARE_PROVIDER_SITE_OTHER): Payer: Medicare Other | Admitting: Family Medicine

## 2021-04-11 ENCOUNTER — Other Ambulatory Visit: Payer: Self-pay | Admitting: Family Medicine

## 2021-04-11 ENCOUNTER — Ambulatory Visit (INDEPENDENT_AMBULATORY_CARE_PROVIDER_SITE_OTHER): Payer: Medicare Other

## 2021-04-11 ENCOUNTER — Encounter: Payer: Self-pay | Admitting: Cardiovascular Disease

## 2021-04-11 VITALS — BP 100/72 | HR 75 | Temp 98.1°F | Ht 65.5 in | Wt 118.2 lb

## 2021-04-11 DIAGNOSIS — R051 Acute cough: Secondary | ICD-10-CM

## 2021-04-11 DIAGNOSIS — U099 Post covid-19 condition, unspecified: Secondary | ICD-10-CM

## 2021-04-11 DIAGNOSIS — I951 Orthostatic hypotension: Secondary | ICD-10-CM | POA: Diagnosis not present

## 2021-04-11 DIAGNOSIS — N3000 Acute cystitis without hematuria: Secondary | ICD-10-CM

## 2021-04-11 DIAGNOSIS — H938X3 Other specified disorders of ear, bilateral: Secondary | ICD-10-CM

## 2021-04-11 DIAGNOSIS — R0989 Other specified symptoms and signs involving the circulatory and respiratory systems: Secondary | ICD-10-CM

## 2021-04-11 DIAGNOSIS — R350 Frequency of micturition: Secondary | ICD-10-CM | POA: Diagnosis not present

## 2021-04-11 DIAGNOSIS — R42 Dizziness and giddiness: Secondary | ICD-10-CM | POA: Diagnosis not present

## 2021-04-11 DIAGNOSIS — R5383 Other fatigue: Secondary | ICD-10-CM

## 2021-04-11 DIAGNOSIS — R059 Cough, unspecified: Secondary | ICD-10-CM | POA: Diagnosis not present

## 2021-04-11 DIAGNOSIS — E871 Hypo-osmolality and hyponatremia: Secondary | ICD-10-CM

## 2021-04-11 LAB — CBC WITH DIFFERENTIAL/PLATELET
Basophils Absolute: 0 10*3/uL (ref 0.0–0.1)
Basophils Relative: 0.6 % (ref 0.0–3.0)
Eosinophils Absolute: 0.1 10*3/uL (ref 0.0–0.7)
Eosinophils Relative: 1.7 % (ref 0.0–5.0)
HCT: 38.2 % (ref 36.0–46.0)
Hemoglobin: 12.8 g/dL (ref 12.0–15.0)
Lymphocytes Relative: 21.6 % (ref 12.0–46.0)
Lymphs Abs: 1.4 10*3/uL (ref 0.7–4.0)
MCHC: 33.5 g/dL (ref 30.0–36.0)
MCV: 95.2 fl (ref 78.0–100.0)
Monocytes Absolute: 0.7 10*3/uL (ref 0.1–1.0)
Monocytes Relative: 10.7 % (ref 3.0–12.0)
Neutro Abs: 4.4 10*3/uL (ref 1.4–7.7)
Neutrophils Relative %: 65.4 % (ref 43.0–77.0)
Platelets: 429 10*3/uL — ABNORMAL HIGH (ref 150.0–400.0)
RBC: 4.01 Mil/uL (ref 3.87–5.11)
RDW: 13.4 % (ref 11.5–15.5)
WBC: 6.7 10*3/uL (ref 4.0–10.5)

## 2021-04-11 LAB — BASIC METABOLIC PANEL
BUN: 15 mg/dL (ref 6–23)
CO2: 29 mEq/L (ref 19–32)
Calcium: 9.5 mg/dL (ref 8.4–10.5)
Chloride: 96 mEq/L (ref 96–112)
Creatinine, Ser: 0.68 mg/dL (ref 0.40–1.20)
GFR: 82.82 mL/min (ref >60.00)
Glucose, Bld: 85 mg/dL (ref 70–99)
Potassium: 3.8 mEq/L (ref 3.5–5.1)
Sodium: 130 mEq/L — ABNORMAL LOW (ref 135–145)

## 2021-04-11 LAB — POCT URINALYSIS DIPSTICK
Bilirubin, UA: NEGATIVE
Blood, UA: NEGATIVE
Glucose, UA: NEGATIVE
Ketones, UA: NEGATIVE
Nitrite, UA: NEGATIVE
Protein, UA: NEGATIVE
Spec Grav, UA: 1.02 (ref 1.010–1.025)
Urobilinogen, UA: 0.2 E.U./dL
pH, UA: 7 (ref 5.0–8.0)

## 2021-04-11 LAB — POCT CBG (FASTING - GLUCOSE)-MANUAL ENTRY: Glucose Fasting, POC: 82 mg/dL (ref 70–99)

## 2021-04-11 NOTE — Patient Instructions (Signed)
I will send your urine for culture and follow-up with you. ? ?Please go downstairs to get your chest x-ray before you leave today. ? ?Continue treating your upper respiratory symptoms over-the-counter.  I will let you know if we need an antibiotic based on your results from today. ? ?Stay well-hydrated.  Change positions slowly.  Wear compression stockings.  Be generous with your sodium intake. ?Follow-up with your cardiologist or Dr. Jenny Reichmann. ?

## 2021-04-11 NOTE — Progress Notes (Signed)
? ?Subjective:  ? ? Patient ID: Sheena Craig, female    DOB: 10-Aug-1940, 81 y.o.   MRN: [id number] ? ?HPI ?Chief Complaint  ?Patient presents with  ? Urinary Frequency  ?  Started in January, denies dysuria. Notes discomfort in groin area, especially in different sleeping positions.   ? Ear Fullness  ?  Would like to check ears and lungs after getting over COVID.   ? Hypotension  ?  Hypotension after being up for a long period of time. Notes that she is feeling okay today. ? ?Lowest was 85/48  ? ?Here today with her daughter. She has several concerns today.  ?Complains of a 3 month history of urinary frequency, urgency and discomfort in anterior right groin. States she was treated for UTI at a different facility. States the antibiotic made her sick with GI symptoms.  ? ?Reports squeezing sensation in her right groin, worse with certain movements.  ? ?Recent Covid infection. This is day 10 of her illness. She has increased fatigue, rhinorrhea, cough that is mainly dry. Tightness on left side of her chest with deep inspiration.  ? ?States her ears feel full. Nasal congestion. She takes Mucinex.  ? ?Denies fever, chills, dizziness, chest pain, palpitations, shortness of breath, abdominal pain, N/V/D, urinary symptoms, LE edema.  ? ?Hx of hypotension. States low BP and dizziness has been daily issue since Covid, worse than her usual.  ?She has compression stockings at home but has not been wearing them.  ? ?Past Medical History:  ?Diagnosis Date  ? ANEMIA-NOS 10/17/2006  ? Anxiety   ? DEPRESSION 10/17/2006  ? FIBROMYALGIA 02/09/2007  ? Fibromyalgia   ? GERD 10/17/2006  ? HYPERLIPIDEMIA 10/17/2006  ? HYPOTHYROIDISM 10/17/2006  ? LUMBAR RADICULOPATHY, LEFT 10/06/2009  ? MIGRAINE HEADACHE 10/17/2006  ? OSTEOARTHRITIS 10/17/2006  ? OSTEOPENIA 10/17/2006  ? Osteopenia 11/23/2014  ? OSTEOPOROSIS 02/09/2007  ? 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  ? Sarcoidosis 10/17/2006  ? ?Current Outpatient Medications on File Prior to  Visit  ?Medication Sig Dispense Refill  ? aspirin (ASPIRIN 81) 81 MG EC tablet Take 1 tablet (81 mg total) by mouth daily. Swallow whole. 30 tablet 12  ? BIOTIN PO Take 1 tablet by mouth daily.    ? Calcium-Vitamin D-Vitamin K (CALCIUM SOFT CHEWS PO) Take 3 tablets by mouth daily.    ? cyanocobalamin (,VITAMIN B-12,) 1000 MCG/ML injection INJECT 1 ML INTRAMUSCULARLY MONTHLY AS DIRECTED 3 mL 5  ? dextromethorphan-guaiFENesin (MUCINEX DM) 30-600 MG per 12 hr tablet Take 1 tablet by mouth 2 (two) times daily as needed for cough.    ? docusate sodium (COLACE) 100 MG capsule Take 100 mg by mouth daily as needed for mild constipation.    ? Ferrous Sulfate (FE-CAPS PO) Take 1 capsule by mouth 3 (three) times a week.    ? FLUoxetine (PROZAC) 10 MG capsule Take 1 capsule (10 mg total) by mouth daily. 90 capsule 3  ? FOLIC ACID PO Take 1 tablet by mouth 3 (three) times a week.    ? levothyroxine (SYNTHROID) 100 MCG tablet Take 1 tablet (100 mcg total) by mouth daily. 90 tablet 2  ? Magnesium 400 MG CAPS Take 1 tablet by mouth every morning.    ? magnesium oxide (MAG-OX) 400 MG tablet Take 400 mg by mouth 3 (three) times a week.    ? NONFORMULARY OR COMPOUNDED ITEM 1 tablet daily.    ? NONFORMULARY OR COMPOUNDED ITEM 1 tablet daily.    ? rosuvastatin (CRESTOR)  10 MG tablet Take 1 tablet (10 mg total) by mouth daily. 90 tablet 1  ? SYRINGE-NEEDLE, DISP, 3 ML (B-D 3CC LUER-LOK SYR 25GX1") 25G X 1" 3 ML MISC USE FOR B12 SHOTS ONCE A MONTH 100 each 1  ? ?No current facility-administered medications on file prior to visit.  ? ? ? ?Reviewed allergies, medications, past medical, surgical, family, and social history. ? ? ? ?Review of Systems ?Pertinent positives and negatives in the history of present illness. ? ?   ?Objective:  ? Physical Exam ?Constitutional:   ?   General: She is not in acute distress. ?   Appearance: She is not ill-appearing.  ?HENT:  ?   Right Ear: Ear canal normal.  ?   Left Ear: Ear canal normal.  ?   Ears:  ?    Comments: Scarring of bilateral TMs ?Eyes:  ?   Extraocular Movements: Extraocular movements intact.  ?   Conjunctiva/sclera: Conjunctivae normal.  ?   Pupils: Pupils are equal, round, and reactive to light.  ?Cardiovascular:  ?   Rate and Rhythm: Normal rate and regular rhythm.  ?   Pulses: Normal pulses.  ?Pulmonary:  ?   Effort: Pulmonary effort is normal.  ?   Breath sounds: Examination of the left-lower field reveals decreased breath sounds. Decreased breath sounds present.  ?Abdominal:  ?   General: Abdomen is flat. Bowel sounds are normal. There is no distension.  ?   Palpations: Abdomen is soft.  ?   Tenderness: There is no abdominal tenderness. There is no right CVA tenderness, left CVA tenderness, guarding or rebound.  ?Musculoskeletal:  ?   Right lower leg: No edema.  ?   Left lower leg: No edema.  ?Skin: ?   General: Skin is warm and dry.  ?   Capillary Refill: Capillary refill takes less than 2 seconds.  ?Neurological:  ?   General: No focal deficit present.  ?   Mental Status: She is alert and oriented to person, place, and time. Mental status is at baseline.  ?   Cranial Nerves: No cranial nerve deficit.  ?   Motor: No weakness.  ?Psychiatric:     ?   Mood and Affect: Mood normal.     ?   Thought Content: Thought content normal.  ? ?BP 100/72 (BP Location: Left Arm, Patient Position: Sitting, Cuff Size: Normal)   Pulse 75   Temp 98.1 ?F (36.7 ?C) (Oral)   Ht 5' 5.5" (1.664 m)   Wt 118 lb 3.2 oz (53.6 kg)   SpO2 97%   BMI 19.37 kg/m?  ? ? ? ? ?   ?Assessment & Plan:  ?Acute cystitis without hematuria ?-UA shows a moderate amount of leukocytes, negative otherwise.  ?she prefers to hold off on starting antibiotics until her urine culture is back.  ? ?Urinary frequency - Plan: POCT urinalysis dipstick, Urine Culture ? ?Orthostatic hypotension ?-orthostatic vitals done today and negative. Encouraged her to stay hydrated, change positions slowly and wear compression stockings. Increase sodium intake.  Follow up with cardiologist.  ? ?Acute cough - Plan: DG Chest 2 View ?-Covid related. Continue symptomatic management.  ? 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  ? ?Sensation of fullness in both ears ?-exam unremarkable. Suspect this is due to nasal congestion. May try OTC nasal spray such as Flonase.  ? ?Abnormal lung sounds - Plan: DG Chest 2 View ?-LLL with  decreased lung sounds. Follow up pending chest X ray ? ?Dizzy - Plan: POCT CBG (Fasting - Glucose), CBC with Differential/Platelet, Basic metabolic panel, Basic metabolic panel, CBC with Differential/Platelet ?-not orthostatic in the office.  ?POC blood glucose is 82. Encouraged her to eat small frequent meals with protein to maintain BS ? ?Other fatigue - Plan: POCT CBG (Fasting - Glucose), CBC with Differential/Platelet, Basic metabolic panel, Basic metabolic panel, CBC with Differential/Platelet ?-most likely due to Covid illness and will improve. Check labs and follow up ? ? ?

## 2021-04-11 NOTE — Progress Notes (Signed)
Her sodium level is low. Please ask her to cut back on water intake and try drinking over the counter Pedialyte. This may be the explanation for her symptoms. I recommend rechecking her sodium level Friday morning or Monday at the latest. We can do that here.

## 2021-04-12 ENCOUNTER — Encounter: Payer: Self-pay | Admitting: Family Medicine

## 2021-04-12 LAB — URINE CULTURE: Result:: NO GROWTH

## 2021-04-13 ENCOUNTER — Other Ambulatory Visit (INDEPENDENT_AMBULATORY_CARE_PROVIDER_SITE_OTHER): Payer: Medicare Other

## 2021-04-13 DIAGNOSIS — E871 Hypo-osmolality and hyponatremia: Secondary | ICD-10-CM | POA: Diagnosis not present

## 2021-04-13 LAB — BASIC METABOLIC PANEL
BUN: 13 mg/dL (ref 6–23)
CO2: 31 mEq/L (ref 19–32)
Calcium: 9.7 mg/dL (ref 8.4–10.5)
Chloride: 101 mEq/L (ref 96–112)
Creatinine, Ser: 0.71 mg/dL (ref 0.40–1.20)
GFR: 80.85 mL/min (ref >60.00)
Glucose, Bld: 56 mg/dL — ABNORMAL LOW (ref 70–99)
Potassium: 4.6 mEq/L (ref 3.5–5.1)
Sodium: 136 mEq/L (ref 135–145)

## 2021-04-13 NOTE — Telephone Encounter (Signed)
Pt is wondering what could be the possible causes of the high WBC count in her urine? ?

## 2021-04-13 NOTE — Telephone Encounter (Signed)
Spoke w patient and relayed Vickie's response. Pt verbalized understanding and said she will watch out for any symptoms and will notify us if anything arises.  ?

## 2021-04-17 DIAGNOSIS — F4320 Adjustment disorder, unspecified: Secondary | ICD-10-CM | POA: Diagnosis not present

## 2021-04-18 DIAGNOSIS — M461 Sacroiliitis, not elsewhere classified: Secondary | ICD-10-CM | POA: Diagnosis not present

## 2021-04-18 DIAGNOSIS — M9902 Segmental and somatic dysfunction of thoracic region: Secondary | ICD-10-CM | POA: Diagnosis not present

## 2021-04-18 DIAGNOSIS — M9905 Segmental and somatic dysfunction of pelvic region: Secondary | ICD-10-CM | POA: Diagnosis not present

## 2021-04-18 DIAGNOSIS — M9901 Segmental and somatic dysfunction of cervical region: Secondary | ICD-10-CM | POA: Diagnosis not present

## 2021-04-23 ENCOUNTER — Encounter: Payer: Self-pay | Admitting: Internal Medicine

## 2021-04-25 ENCOUNTER — Ambulatory Visit (HOSPITAL_COMMUNITY): Payer: Medicare Other | Attending: Cardiovascular Disease

## 2021-04-25 DIAGNOSIS — M9902 Segmental and somatic dysfunction of thoracic region: Secondary | ICD-10-CM | POA: Diagnosis not present

## 2021-04-25 DIAGNOSIS — M9901 Segmental and somatic dysfunction of cervical region: Secondary | ICD-10-CM | POA: Diagnosis not present

## 2021-04-25 DIAGNOSIS — I493 Ventricular premature depolarization: Secondary | ICD-10-CM | POA: Insufficient documentation

## 2021-04-25 DIAGNOSIS — M461 Sacroiliitis, not elsewhere classified: Secondary | ICD-10-CM | POA: Diagnosis not present

## 2021-04-25 DIAGNOSIS — I2510 Atherosclerotic heart disease of native coronary artery without angina pectoris: Secondary | ICD-10-CM | POA: Diagnosis not present

## 2021-04-25 DIAGNOSIS — I340 Nonrheumatic mitral (valve) insufficiency: Secondary | ICD-10-CM | POA: Insufficient documentation

## 2021-04-25 DIAGNOSIS — M9905 Segmental and somatic dysfunction of pelvic region: Secondary | ICD-10-CM | POA: Diagnosis not present

## 2021-04-25 LAB — ECHOCARDIOGRAM COMPLETE
Area-P 1/2: 3.13 cm2
S' Lateral: 2.1 cm

## 2021-04-26 ENCOUNTER — Encounter: Payer: Self-pay | Admitting: Internal Medicine

## 2021-05-01 ENCOUNTER — Ambulatory Visit (INDEPENDENT_AMBULATORY_CARE_PROVIDER_SITE_OTHER): Payer: Medicare Other | Admitting: Internal Medicine

## 2021-05-01 ENCOUNTER — Encounter: Payer: Self-pay | Admitting: Internal Medicine

## 2021-05-01 VITALS — BP 112/58 | HR 74 | Temp 98.3°F | Ht 65.5 in | Wt 124.0 lb

## 2021-05-01 DIAGNOSIS — M5416 Radiculopathy, lumbar region: Secondary | ICD-10-CM

## 2021-05-01 DIAGNOSIS — M9901 Segmental and somatic dysfunction of cervical region: Secondary | ICD-10-CM | POA: Diagnosis not present

## 2021-05-01 DIAGNOSIS — M9905 Segmental and somatic dysfunction of pelvic region: Secondary | ICD-10-CM | POA: Diagnosis not present

## 2021-05-01 DIAGNOSIS — M461 Sacroiliitis, not elsewhere classified: Secondary | ICD-10-CM | POA: Diagnosis not present

## 2021-05-01 DIAGNOSIS — F32A Depression, unspecified: Secondary | ICD-10-CM

## 2021-05-01 DIAGNOSIS — E538 Deficiency of other specified B group vitamins: Secondary | ICD-10-CM

## 2021-05-01 DIAGNOSIS — E559 Vitamin D deficiency, unspecified: Secondary | ICD-10-CM | POA: Diagnosis not present

## 2021-05-01 DIAGNOSIS — M9902 Segmental and somatic dysfunction of thoracic region: Secondary | ICD-10-CM | POA: Diagnosis not present

## 2021-05-01 NOTE — Patient Instructions (Signed)
Please continue all other medications as before, and refills have been done if requested. ? ?Please have the pharmacy call with any other refills you may need. ? ?Please continue your efforts at being more active, low cholesterol diet, and weight control ? ?Please keep your appointments with your specialists as you may have planned ? ?You will be contacted regarding the referral for: MRI and Dr Marcelino Scot ?
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
## 2021-05-04 DIAGNOSIS — M9901 Segmental and somatic dysfunction of cervical region: Secondary | ICD-10-CM | POA: Diagnosis not present

## 2021-05-04 DIAGNOSIS — M9902 Segmental and somatic dysfunction of thoracic region: Secondary | ICD-10-CM | POA: Diagnosis not present

## 2021-05-04 DIAGNOSIS — M9905 Segmental and somatic dysfunction of pelvic region: Secondary | ICD-10-CM | POA: Diagnosis not present

## 2021-05-04 DIAGNOSIS — M461 Sacroiliitis, not elsewhere classified: Secondary | ICD-10-CM | POA: Diagnosis not present

## 2021-05-05 ENCOUNTER — Encounter: Payer: Self-pay | Admitting: Internal Medicine

## 2021-05-05 DIAGNOSIS — M5416 Radiculopathy, lumbar region: Secondary | ICD-10-CM | POA: Insufficient documentation

## 2021-05-05 NOTE — Assessment & Plan Note (Signed)
With worsening symptoms, for LS spine mri, , refer Dr Winifred Olive per pt reqeust ?

## 2021-05-05 NOTE — Assessment & Plan Note (Signed)
Last vitamin D ?Lab Results  ?Component Value Date  ? VD25OH 31.88 08/17/2020  ? ?Low, to start oral replacement ? ?

## 2021-05-05 NOTE — Assessment & Plan Note (Signed)
Lab Results  ?Component Value Date  ? [id number] 663 08/17/2020  ? ?Stable, cont oral replacement - b12 1000 mcg qd ? ?

## 2021-05-05 NOTE — Assessment & Plan Note (Signed)
Mild chronic stable, declines need for further tx or counseling ?

## 2021-05-06 ENCOUNTER — Ambulatory Visit
Admission: RE | Admit: 2021-05-06 | Discharge: 2021-05-06 | Disposition: A | Discharge status: Home / Self Care | Payer: Medicare Other | Source: Ambulatory Visit | Attending: Internal Medicine | Admitting: Internal Medicine

## 2021-05-06 DIAGNOSIS — M5416 Radiculopathy, lumbar region: Secondary | ICD-10-CM

## 2021-05-06 DIAGNOSIS — M545 Low back pain, unspecified: Secondary | ICD-10-CM | POA: Diagnosis not present

## 2021-05-06 DIAGNOSIS — M48061 Spinal stenosis, lumbar region without neurogenic claudication: Secondary | ICD-10-CM | POA: Diagnosis not present

## 2021-05-08 ENCOUNTER — Telehealth: Payer: Self-pay | Admitting: Internal Medicine

## 2021-05-08 ENCOUNTER — Encounter: Payer: Self-pay | Admitting: Internal Medicine

## 2021-05-08 DIAGNOSIS — S32020D Wedge compression fracture of second lumbar vertebra, subsequent encounter for fracture with routine healing: Secondary | ICD-10-CM

## 2021-05-08 NOTE — Telephone Encounter (Signed)
Pt requesting a cb to discuss ortho referral placed 4-18 ? ?Pt states she wants to make sure referral has been made to correct specialist giving results shown on the mri ?

## 2021-05-08 NOTE — Telephone Encounter (Signed)
Called pt per chart referral was placed 4/18 (Dr.Handy). Referral still pending. Will send referral coordinators a msg to reach out to patient regarding referral../lmb ?

## 2021-05-14 NOTE — Telephone Encounter (Signed)
Haydee with Ortho office calls today in regards to PT's referral. Haydee states that referral would need to be sent out to a spine/back specialist based on MRI.  ? ?CB if needed: [phone] ?

## 2021-05-14 NOTE — Addendum Note (Signed)
Addended by: Biagio Borg on: 05/14/2021 11:16 AM ? ? Modules accepted: Orders ? ?

## 2021-05-14 NOTE — Telephone Encounter (Signed)
Ok this is done 

## 2021-05-24 DIAGNOSIS — H35363 Drusen (degenerative) of macula, bilateral: Secondary | ICD-10-CM | POA: Diagnosis not present

## 2021-05-24 DIAGNOSIS — H16223 Keratoconjunctivitis sicca, not specified as Sjogren's, bilateral: Secondary | ICD-10-CM | POA: Diagnosis not present

## 2021-05-24 DIAGNOSIS — H18452 Nodular corneal degeneration, left eye: Secondary | ICD-10-CM | POA: Diagnosis not present

## 2021-05-24 DIAGNOSIS — Z961 Presence of intraocular lens: Secondary | ICD-10-CM | POA: Diagnosis not present

## 2021-05-25 DIAGNOSIS — M48061 Spinal stenosis, lumbar region without neurogenic claudication: Secondary | ICD-10-CM | POA: Diagnosis not present

## 2021-05-25 DIAGNOSIS — M25559 Pain in unspecified hip: Secondary | ICD-10-CM | POA: Diagnosis not present

## 2021-06-04 DIAGNOSIS — M1611 Unilateral primary osteoarthritis, right hip: Secondary | ICD-10-CM | POA: Diagnosis not present

## 2021-06-04 DIAGNOSIS — M25551 Pain in right hip: Secondary | ICD-10-CM | POA: Diagnosis not present

## 2021-06-04 DIAGNOSIS — M25552 Pain in left hip: Secondary | ICD-10-CM | POA: Diagnosis not present

## 2021-06-08 DIAGNOSIS — Z01419 Encounter for gynecological examination (general) (routine) without abnormal findings: Secondary | ICD-10-CM | POA: Diagnosis not present

## 2021-06-08 DIAGNOSIS — Z1231 Encounter for screening mammogram for malignant neoplasm of breast: Secondary | ICD-10-CM | POA: Diagnosis not present

## 2021-06-21 DIAGNOSIS — F4320 Adjustment disorder, unspecified: Secondary | ICD-10-CM | POA: Diagnosis not present

## 2021-07-04 DIAGNOSIS — F4320 Adjustment disorder, unspecified: Secondary | ICD-10-CM | POA: Diagnosis not present

## 2021-08-21 ENCOUNTER — Encounter: Payer: Self-pay | Admitting: Internal Medicine

## 2021-08-21 DIAGNOSIS — D508 Other iron deficiency anemias: Secondary | ICD-10-CM

## 2021-08-21 DIAGNOSIS — D510 Vitamin B12 deficiency anemia due to intrinsic factor deficiency: Secondary | ICD-10-CM

## 2021-08-21 DIAGNOSIS — E559 Vitamin D deficiency, unspecified: Secondary | ICD-10-CM

## 2021-08-21 DIAGNOSIS — R739 Hyperglycemia, unspecified: Secondary | ICD-10-CM

## 2021-08-21 DIAGNOSIS — E7849 Other hyperlipidemia: Secondary | ICD-10-CM

## 2021-08-24 ENCOUNTER — Other Ambulatory Visit (INDEPENDENT_AMBULATORY_CARE_PROVIDER_SITE_OTHER): Payer: Medicare Other

## 2021-08-24 DIAGNOSIS — R739 Hyperglycemia, unspecified: Secondary | ICD-10-CM | POA: Diagnosis not present

## 2021-08-24 DIAGNOSIS — E559 Vitamin D deficiency, unspecified: Secondary | ICD-10-CM | POA: Diagnosis not present

## 2021-08-24 DIAGNOSIS — D510 Vitamin B12 deficiency anemia due to intrinsic factor deficiency: Secondary | ICD-10-CM

## 2021-08-24 DIAGNOSIS — E7849 Other hyperlipidemia: Secondary | ICD-10-CM | POA: Diagnosis not present

## 2021-08-24 DIAGNOSIS — D508 Other iron deficiency anemias: Secondary | ICD-10-CM

## 2021-08-24 LAB — VITAMIN B12: Vitamin B-12: 251 pg/mL (ref 211–911)

## 2021-08-24 LAB — URINALYSIS, ROUTINE W REFLEX MICROSCOPIC
Bilirubin Urine: NEGATIVE
Hgb urine dipstick: NEGATIVE
Ketones, ur: NEGATIVE
Nitrite: NEGATIVE
Specific Gravity, Urine: 1.01 (ref 1.000–1.030)
Total Protein, Urine: NEGATIVE
Urine Glucose: NEGATIVE
Urobilinogen, UA: 0.2 (ref 0.0–1.0)
pH: 7.5 (ref 5.0–8.0)

## 2021-08-24 LAB — VITAMIN D 25 HYDROXY (VIT D DEFICIENCY, FRACTURES): VITD: 48.08 ng/mL (ref 30.00–100.00)

## 2021-08-24 LAB — TSH: TSH: 16.16 u[IU]/mL — ABNORMAL HIGH (ref 0.35–5.50)

## 2021-08-24 LAB — BASIC METABOLIC PANEL
BUN: 15 mg/dL (ref 6–23)
CO2: 30 mEq/L (ref 19–32)
Calcium: 9.6 mg/dL (ref 8.4–10.5)
Chloride: 101 mEq/L (ref 96–112)
Creatinine, Ser: 0.77 mg/dL (ref 0.40–1.20)
GFR: 73.16 mL/min (ref >60.00)
Glucose, Bld: 82 mg/dL (ref 70–99)
Potassium: 4.4 mEq/L (ref 3.5–5.1)
Sodium: 137 mEq/L (ref 135–145)

## 2021-08-24 LAB — MICROALBUMIN / CREATININE URINE RATIO
Creatinine,U: 124.6 mg/dL
Microalb Creat Ratio: 0.6 mg/g (ref 0.0–30.0)
Microalb, Ur: 0.8 mg/dL (ref 0.0–1.9)

## 2021-08-24 LAB — CBC WITH DIFFERENTIAL/PLATELET
Basophils Absolute: 0.1 10*3/uL (ref 0.0–0.1)
Basophils Relative: 1.1 % (ref 0.0–3.0)
Eosinophils Absolute: 0.2 10*3/uL (ref 0.0–0.7)
Eosinophils Relative: 3.5 % (ref 0.0–5.0)
HCT: 40.2 % (ref 36.0–46.0)
Hemoglobin: 13.1 g/dL (ref 12.0–15.0)
Lymphocytes Relative: 25.2 % (ref 12.0–46.0)
Lymphs Abs: 1.4 10*3/uL (ref 0.7–4.0)
MCHC: 32.7 g/dL (ref 30.0–36.0)
MCV: 95.9 fl (ref 78.0–100.0)
Monocytes Absolute: 0.5 10*3/uL (ref 0.1–1.0)
Monocytes Relative: 9.1 % (ref 3.0–12.0)
Neutro Abs: 3.4 10*3/uL (ref 1.4–7.7)
Neutrophils Relative %: 61.1 % (ref 43.0–77.0)
Platelets: 374 10*3/uL (ref 150.0–400.0)
RBC: 4.19 Mil/uL (ref 3.87–5.11)
RDW: 13.6 % (ref 11.5–15.5)
WBC: 5.6 10*3/uL (ref 4.0–10.5)

## 2021-08-24 LAB — FERRITIN: Ferritin: 97.6 ng/mL (ref 10.0–291.0)

## 2021-08-24 LAB — LIPID PANEL
Cholesterol: 193 mg/dL (ref 0–200)
HDL: 74.4 mg/dL (ref >39.00)
LDL Cholesterol: 97 mg/dL (ref 0–99)
NonHDL: 118.31
Total CHOL/HDL Ratio: 3
Triglycerides: 108 mg/dL (ref 0.0–149.0)
VLDL: 21.6 mg/dL (ref 0.0–40.0)

## 2021-08-24 LAB — HEPATIC FUNCTION PANEL
ALT: 14 U/L (ref 0–35)
AST: 17 U/L (ref 0–37)
Albumin: 4.2 g/dL (ref 3.5–5.2)
Alkaline Phosphatase: 40 U/L (ref 39–117)
Bilirubin, Direct: 0.1 mg/dL (ref 0.0–0.3)
Total Bilirubin: 0.6 mg/dL (ref 0.2–1.2)
Total Protein: 6.7 g/dL (ref 6.0–8.3)

## 2021-08-24 LAB — IBC PANEL
Iron: 111 ug/dL (ref 42–145)
Saturation Ratios: 35.2 % (ref 20.0–50.0)
TIBC: 315 ug/dL (ref 250.0–450.0)
Transferrin: 225 mg/dL (ref 212.0–360.0)

## 2021-08-24 LAB — HEMOGLOBIN A1C: Hgb A1c MFr Bld: 5.6 % (ref 4.6–6.5)

## 2021-08-30 ENCOUNTER — Encounter: Payer: Self-pay | Admitting: Internal Medicine

## 2021-08-30 ENCOUNTER — Ambulatory Visit (INDEPENDENT_AMBULATORY_CARE_PROVIDER_SITE_OTHER): Payer: Medicare Other | Admitting: Internal Medicine

## 2021-08-30 VITALS — BP 138/80 | HR 71 | Temp 98.0°F | Ht 65.5 in | Wt 122.0 lb

## 2021-08-30 DIAGNOSIS — M810 Age-related osteoporosis without current pathological fracture: Secondary | ICD-10-CM

## 2021-08-30 DIAGNOSIS — E039 Hypothyroidism, unspecified: Secondary | ICD-10-CM | POA: Diagnosis not present

## 2021-08-30 DIAGNOSIS — E7800 Pure hypercholesterolemia, unspecified: Secondary | ICD-10-CM

## 2021-08-30 DIAGNOSIS — Z0001 Encounter for general adult medical examination with abnormal findings: Secondary | ICD-10-CM

## 2021-08-30 DIAGNOSIS — E559 Vitamin D deficiency, unspecified: Secondary | ICD-10-CM

## 2021-08-30 DIAGNOSIS — D510 Vitamin B12 deficiency anemia due to intrinsic factor deficiency: Secondary | ICD-10-CM

## 2021-08-30 DIAGNOSIS — K5900 Constipation, unspecified: Secondary | ICD-10-CM | POA: Diagnosis not present

## 2021-08-30 DIAGNOSIS — R251 Tremor, unspecified: Secondary | ICD-10-CM | POA: Diagnosis not present

## 2021-08-30 DIAGNOSIS — Z1211 Encounter for screening for malignant neoplasm of colon: Secondary | ICD-10-CM | POA: Diagnosis not present

## 2021-08-30 DIAGNOSIS — R739 Hyperglycemia, unspecified: Secondary | ICD-10-CM

## 2021-08-30 MED ORDER — FLUOXETINE HCL 10 MG PO CAPS: 10.0000 mg | ORAL_CAPSULE | Freq: Every day | ORAL | 3 refills | Status: DC

## 2021-08-30 MED ORDER — LEVOTHYROXINE SODIUM 100 MCG PO TABS
100.0000 ug | ORAL_TABLET | Freq: Every day | ORAL | 3 refills | Status: DC
Start: 2021-08-30 — End: 2022-10-23

## 2021-08-30 MED ORDER — ROSUVASTATIN CALCIUM 10 MG PO TABS: 10.0000 mg | ORAL_TABLET | Freq: Every day | ORAL | 3 refills | Status: DC

## 2021-08-30 MED ORDER — CYANOCOBALAMIN 1000 MCG/ML IJ SOLN: INTRAMUSCULAR | 5 refills | Status: DC

## 2021-08-30 NOTE — Assessment & Plan Note (Signed)
Age and sex appropriate education and counseling updated with regular exercise and diet Referrals for preventative services - declines hep c screen, ok for DXA Immunizations addressed - declines covid booster Smoking counseling  - none needed Evidence for depression or other mood disorder - none significant Most recent labs reviewed. I have personally reviewed and have noted: 1) the patient's medical and social history 2) The patient's current medications and supplements 3) The patient's height, weight, and BMI have been recorded in the chart

## 2021-08-30 NOTE — Assessment & Plan Note (Signed)
Lab Results  Component Value Date   TSH 16.16 (H) 08/24/2021   Uncontrolled but admits to not taking thyroid med on regular baiss, pt to continue levothyroxine 100 mcg qd with better compliance and f/u lab next visit

## 2021-08-30 NOTE — Assessment & Plan Note (Signed)
Lab Results  Component Value Date   VITAMINB12 251 08/24/2021   Low normal, cont IM replacement with good compliance

## 2021-08-30 NOTE — Addendum Note (Signed)
Addended by: Biagio Borg on: 08/30/2021 01:16 PM   Modules accepted: Orders

## 2021-08-30 NOTE — Assessment & Plan Note (Signed)
With mild recent worsening, ? Thyroid releated, for miralax 17 gm daily,  to f/u any worsening symptoms or concerns

## 2021-08-30 NOTE — Assessment & Plan Note (Signed)
Lab Results  Component Value Date   LDLCALC 97 08/24/2021   Stable, pt to continue current statin crestor 10 mg

## 2021-08-30 NOTE — Patient Instructions (Addendum)
Please take the Miralax every day  Please continue all other medications as before every day, and refills have been done if requested  Please have the pharmacy call with any other refills you may need.  Please continue your efforts at being more active, low cholesterol diet, and weight control.  You are otherwise up to date with prevention measures today.  Please keep your appointments with your specialists as you may have planned  Please schedule the bone density test before leaving today at the scheduling desk (where you check out)  Please make an Appointment to return in 6 months, or sooner if needed, also with Lab Appointment for testing done 3-5 days before at the [facility] (so this is for TWO appointments - please see the scheduling desk as you leave)

## 2021-08-30 NOTE — Assessment & Plan Note (Signed)
Also due for f/u DXA  - s/p 5 yrs fosamax, consider prolia start
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
## 2021-08-30 NOTE — Assessment & Plan Note (Signed)
Right hand, mild very mild, declines BB tx today,  to f/u any worsening symptoms or concerns

## 2021-08-30 NOTE — Assessment & Plan Note (Signed)
Last vitamin D Lab Results  Component Value Date   VD25OH 48.08 08/24/2021   Stable, cont oral replacement

## 2021-09-07 ENCOUNTER — Ambulatory Visit (INDEPENDENT_AMBULATORY_CARE_PROVIDER_SITE_OTHER)
Admission: RE | Admit: 2021-09-07 | Discharge: 2021-09-07 | Disposition: A | Discharge status: Home / Self Care | Payer: Medicare Other | Source: Ambulatory Visit | Attending: Internal Medicine | Admitting: Internal Medicine

## 2021-09-07 DIAGNOSIS — M810 Age-related osteoporosis without current pathological fracture: Secondary | ICD-10-CM

## 2021-09-09 DIAGNOSIS — M810 Age-related osteoporosis without current pathological fracture: Secondary | ICD-10-CM | POA: Diagnosis not present

## 2021-09-10 ENCOUNTER — Encounter: Payer: Self-pay | Admitting: Internal Medicine

## 2021-09-11 ENCOUNTER — Telehealth: Payer: Self-pay | Admitting: Internal Medicine

## 2021-09-11 DIAGNOSIS — Z1211 Encounter for screening for malignant neoplasm of colon: Secondary | ICD-10-CM

## 2021-09-11 NOTE — Telephone Encounter (Signed)
Ms Sheena Craig  This pt has osteoporosis - she is wanting to start prolia  Can you assist with starting this?    thanks

## 2021-09-13 NOTE — Telephone Encounter (Signed)
Prolia VOB initiated via [email or web address] ? ?New start ? ?

## 2021-09-15 DIAGNOSIS — Z1211 Encounter for screening for malignant neoplasm of colon: Secondary | ICD-10-CM | POA: Diagnosis not present

## 2021-09-19 NOTE — Telephone Encounter (Signed)
Pt ready for scheduling on or after 09/19/21  Out-of-pocket cost due at time of visit: $276  Primary: BCBS Medicare Adv Prolia co-insurance: 20% (approximately $276) Admin fee co-insurance: 0%   Secondary: n/a Prolia co-insurance:  Admin fee co-insurance:   Deductible: does not apply  Prior Auth: NOT required PA# Valid:   ** This summary of benefits is an estimation of the patient's out-of-pocket cost. Exact cost may vary based on individual plan coverage.

## 2021-09-21 LAB — COLOGUARD: COLOGUARD: NEGATIVE

## 2021-09-24 ENCOUNTER — Encounter: Payer: Self-pay | Admitting: Internal Medicine

## 2021-09-25 DIAGNOSIS — F4320 Adjustment disorder, unspecified: Secondary | ICD-10-CM | POA: Diagnosis not present

## 2021-10-08 ENCOUNTER — Ambulatory Visit (INDEPENDENT_AMBULATORY_CARE_PROVIDER_SITE_OTHER): Payer: Medicare Other

## 2021-10-08 DIAGNOSIS — M810 Age-related osteoporosis without current pathological fracture: Secondary | ICD-10-CM | POA: Diagnosis not present

## 2021-10-08 MED ORDER — DENOSUMAB 60 MG/ML ~~LOC~~ SOSY
60.0000 mg | PREFILLED_SYRINGE | Freq: Once | SUBCUTANEOUS | Status: AC
  Administered 2021-10-08: 60 mg via SUBCUTANEOUS

## 2021-10-08 NOTE — Progress Notes (Signed)
After obtaining consent, and per orders of Dr. Jenny Reichmann, injection of Prolia given in the left arm subcut by Marrian Salvage. Patient instructed to report any adverse reaction to me immediately.

## 2021-10-18 DIAGNOSIS — F4320 Adjustment disorder, unspecified: Secondary | ICD-10-CM | POA: Diagnosis not present

## 2021-10-23 ENCOUNTER — Encounter: Payer: Self-pay | Admitting: Internal Medicine

## 2021-10-31 ENCOUNTER — Ambulatory Visit (INDEPENDENT_AMBULATORY_CARE_PROVIDER_SITE_OTHER): Payer: Medicare Other

## 2021-10-31 ENCOUNTER — Ambulatory Visit: Payer: Medicare Other | Admitting: Family Medicine

## 2021-10-31 DIAGNOSIS — Z23 Encounter for immunization: Secondary | ICD-10-CM | POA: Diagnosis not present

## 2021-11-01 DIAGNOSIS — F4320 Adjustment disorder, unspecified: Secondary | ICD-10-CM | POA: Diagnosis not present

## 2021-11-02 ENCOUNTER — Encounter: Payer: Self-pay | Admitting: Family Medicine

## 2021-11-02 ENCOUNTER — Ambulatory Visit (INDEPENDENT_AMBULATORY_CARE_PROVIDER_SITE_OTHER): Payer: Medicare Other | Admitting: Family Medicine

## 2021-11-02 VITALS — BP 146/69 | HR 77 | Ht 65.5 in | Wt 126.0 lb

## 2021-11-02 DIAGNOSIS — E039 Hypothyroidism, unspecified: Secondary | ICD-10-CM | POA: Diagnosis not present

## 2021-11-02 DIAGNOSIS — Z7689 Persons encountering health services in other specified circumstances: Secondary | ICD-10-CM
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
## 2021-11-02 NOTE — Patient Instructions (Addendum)
Such a pleasure meeting you today!   Your health is in great shape--keep up the good work!  You can stop the baby aspirin  We will check your thyroid and see if we need to make any medication changes   You are due for your next wellness exam August 2024

## 2021-11-02 NOTE — Assessment & Plan Note (Signed)
-   pleasant 81 yo female who presents to establish care due to shorter commute from pervious provider - TSH elevated on last visit. Have ordered tsh to assess formulary of levothyroxine dosages  - follow up in 6 months

## 2021-11-03 LAB — T4, FREE: Free T4: 1.2 ng/dL (ref 0.8–1.8)

## 2021-11-03 LAB — TSH+FREE T4: TSH W/REFLEX TO FT4: 10.59 mIU/L — ABNORMAL HIGH (ref 0.40–4.50)

## 2021-11-05 ENCOUNTER — Encounter: Payer: Self-pay | Admitting: Family Medicine

## 2021-11-11 NOTE — Telephone Encounter (Signed)
Last Prolia inj 10/08/21 Next Prolia inj due 04/09/22

## 2021-11-12 DIAGNOSIS — M25559 Pain in unspecified hip: Secondary | ICD-10-CM | POA: Diagnosis not present
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
## 2021-12-11 ENCOUNTER — Other Ambulatory Visit: Payer: Self-pay

## 2021-12-11 ENCOUNTER — Encounter: Payer: Self-pay | Admitting: Physical Therapy

## 2021-12-11 ENCOUNTER — Ambulatory Visit: Payer: Medicare Other | Attending: Orthopedic Surgery | Admitting: Physical Therapy

## 2021-12-11 DIAGNOSIS — R252 Cramp and spasm: Secondary | ICD-10-CM | POA: Insufficient documentation

## 2021-12-11 DIAGNOSIS — M5459 Other low back pain: Secondary | ICD-10-CM | POA: Diagnosis not present

## 2021-12-11 DIAGNOSIS — M25552 Pain in left hip: Secondary | ICD-10-CM | POA: Insufficient documentation

## 2021-12-11 DIAGNOSIS — M25551 Pain in right hip: Secondary | ICD-10-CM | POA: Diagnosis not present

## 2021-12-11 NOTE — Patient Instructions (Signed)
DO's and DON'T's   Avoid and/or Minimize positions of forward bending ( flexion)  Side bending and rotation of the trunk  Especially when movements occur together   When your back aches:   Don't sit down   Lie down on your back with a small pillow under your head and one under your knees or as outlined by our therapist. Or, lie in the 90/90 position ( on the floor with your feet and legs on the sofa with knees and hips bent to 90 degrees)  Tying or putting on your shoes:   Don't bend over to tie your shoes or put on socks.  Instead, bring one foot up, cross it over the opposite knee and bend forward (hinge) at the hips to so the task.  Keep your back straight.  If you cannot do this safely, then you need to use long handled assistive devices such as a shoehorn and sock puller.  Exercising:  Don't engage in ballistic types of exercise routines such as high-impact aerobics or jumping rope  Don't do exercises in the gym that bring you forward (abdominal crunches, sit-ups, touching your  toes, knee-to-chest, straight leg raising.)  Follow a regular exercise program that includes a variety of different weight-bearing activities, such as low-impact aerobics, T' ai chi or walking as your physical therapist advises  Do exercises that emphasize return to normal body alignment and strengthening of the muscles that keep your back straight, as outlined in this program or by your therapist  Household tasks:  Don't reach unnecessarily or twist your trunk when mopping, sweeping, vacuuming, raking, making beds, weeding gardens, getting objects ou of cupboards, etc.  Keep your broom, mop, vacuum, or rake close to you and mover your whole body as you move them. Walk over to the area on which you are working. Arrange kitchen, bathroom, and bedroom shelves so that frequently used items may be reached without excessive bending, twisting, and reaching.  Use a  sturdy stool if necessary.  Don't bend from the waist to pick up something up  Off the floor, out of the trunk of your car, or to brush your teeth, wash your face, etc.   Bend at the knees, keeping back straight as possible. Use a reacher if necessary.   Prevention of fracture is the so-called "BOTTOm -Line" in the management of OSTEOPOROSIS. Do not take unnecessary chances in movement. Once a compression fracture occurs, the process is very difficult to control; one fracture is frequently followed by many more.   

## 2021-12-18 ENCOUNTER — Ambulatory Visit: Payer: Medicare Other | Attending: Orthopedic Surgery | Admitting: Physical Therapy

## 2021-12-18 ENCOUNTER — Encounter: Payer: Self-pay | Admitting: Physical Therapy

## 2021-12-18 DIAGNOSIS — M5459 Other low back pain: Secondary | ICD-10-CM | POA: Insufficient documentation

## 2021-12-18 DIAGNOSIS — M25552 Pain in left hip: Secondary | ICD-10-CM | POA: Diagnosis not present

## 2021-12-18 DIAGNOSIS — M25551 Pain in right hip: Secondary | ICD-10-CM | POA: Diagnosis not present

## 2021-12-18 DIAGNOSIS — R252 Cramp and spasm: Secondary | ICD-10-CM | POA: Diagnosis not present
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
## 2021-12-20 ENCOUNTER — Ambulatory Visit: Payer: Medicare Other

## 2021-12-20 DIAGNOSIS — M25551 Pain in right hip: Secondary | ICD-10-CM | POA: Diagnosis not present

## 2021-12-20 DIAGNOSIS — R252 Cramp and spasm: Secondary | ICD-10-CM | POA: Diagnosis not present

## 2021-12-20 DIAGNOSIS — M5459 Other low back pain: Secondary | ICD-10-CM | POA: Diagnosis not present

## 2021-12-20 DIAGNOSIS — M25552 Pain in left hip: Secondary | ICD-10-CM
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
## 2021-12-25 ENCOUNTER — Encounter: Payer: Medicare Other | Admitting: Physical Therapy

## 2021-12-26 DIAGNOSIS — M1611 Unilateral primary osteoarthritis, right hip: Secondary | ICD-10-CM | POA: Diagnosis not present

## 2021-12-26 DIAGNOSIS — M25561 Pain in right knee: Secondary | ICD-10-CM | POA: Diagnosis not present

## 2021-12-27 ENCOUNTER — Encounter: Payer: Self-pay | Admitting: Physical Therapy

## 2021-12-27 ENCOUNTER — Ambulatory Visit: Payer: Medicare Other | Admitting: Physical Therapy

## 2021-12-27 DIAGNOSIS — R252 Cramp and spasm: Secondary | ICD-10-CM | POA: Diagnosis not present

## 2021-12-27 DIAGNOSIS — M25552 Pain in left hip: Secondary | ICD-10-CM

## 2021-12-27 DIAGNOSIS — M25551 Pain in right hip: Secondary | ICD-10-CM

## 2021-12-27 DIAGNOSIS — M5459 Other low back pain: Secondary | ICD-10-CM
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
## 2022-01-01 ENCOUNTER — Ambulatory Visit: Payer: Medicare Other | Admitting: Physical Therapy

## 2022-01-01 ENCOUNTER — Encounter: Payer: Self-pay | Admitting: Physical Therapy

## 2022-01-01 DIAGNOSIS — M25552 Pain in left hip: Secondary | ICD-10-CM | POA: Diagnosis not present

## 2022-01-01 DIAGNOSIS — R252 Cramp and spasm: Secondary | ICD-10-CM | POA: Diagnosis not present

## 2022-01-01 DIAGNOSIS — M25551 Pain in right hip: Secondary | ICD-10-CM | POA: Diagnosis not present

## 2022-01-01 DIAGNOSIS — M5459 Other low back pain: Secondary | ICD-10-CM
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
## 2022-01-09 ENCOUNTER — Ambulatory Visit: Payer: Medicare Other

## 2022-01-09 DIAGNOSIS — M25551 Pain in right hip: Secondary | ICD-10-CM | POA: Diagnosis not present

## 2022-01-09 DIAGNOSIS — M25552 Pain in left hip: Secondary | ICD-10-CM

## 2022-01-09 DIAGNOSIS — M5459 Other low back pain: Secondary | ICD-10-CM | POA: Diagnosis not present

## 2022-01-09 DIAGNOSIS — R252 Cramp and spasm: Secondary | ICD-10-CM | POA: Diagnosis not present
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
## 2022-01-17 ENCOUNTER — Encounter: Payer: Self-pay | Admitting: Physical Therapy

## 2022-01-17 ENCOUNTER — Ambulatory Visit: Payer: Medicare Other | Attending: Orthopedic Surgery | Admitting: Physical Therapy

## 2022-01-17 DIAGNOSIS — F4320 Adjustment disorder, unspecified: Secondary | ICD-10-CM | POA: Diagnosis not present

## 2022-01-17 DIAGNOSIS — M25551 Pain in right hip: Secondary | ICD-10-CM | POA: Diagnosis not present

## 2022-01-17 DIAGNOSIS — M5459 Other low back pain: Secondary | ICD-10-CM | POA: Insufficient documentation

## 2022-01-17 DIAGNOSIS — R252 Cramp and spasm: Secondary | ICD-10-CM | POA: Diagnosis not present

## 2022-01-17 DIAGNOSIS — M25552 Pain in left hip: Secondary | ICD-10-CM | POA: Diagnosis not present
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
## 2022-01-22 ENCOUNTER — Encounter: Payer: Self-pay | Admitting: Family Medicine

## 2022-01-22 DIAGNOSIS — Z7189 Other specified counseling: Secondary | ICD-10-CM

## 2022-01-23 NOTE — Telephone Encounter (Signed)
Orders signed.

## 2022-02-18 NOTE — Telephone Encounter (Signed)
Prolia VOB initiated via [email or web address]  Last Prolia inj 10/08/21 Next Prolia inj due 04/09/22

## 2022-02-26 DIAGNOSIS — F4320 Adjustment disorder, unspecified: Secondary | ICD-10-CM | POA: Diagnosis not present

## 2022-03-08 ENCOUNTER — Ambulatory Visit: Payer: Medicare Other | Admitting: Internal Medicine

## 2022-03-11 DIAGNOSIS — K080 Exfoliation of teeth due to systemic causes: Secondary | ICD-10-CM | POA: Diagnosis not present

## 2022-03-12 NOTE — Telephone Encounter (Signed)
Forwarding to Rx Prior Auth Team 

## 2022-03-15 ENCOUNTER — Other Ambulatory Visit (HOSPITAL_COMMUNITY): Payer: Self-pay

## 2022-03-15 NOTE — Telephone Encounter (Signed)
Prolia VOB initiated via [email or web address] 

## 2022-03-22 NOTE — Telephone Encounter (Signed)
    Submitted a prior auth to [facility] for medical benefit. Status is pending.

## 2022-03-25 ENCOUNTER — Other Ambulatory Visit (HOSPITAL_COMMUNITY): Payer: Self-pay

## 2022-03-25 NOTE — Telephone Encounter (Signed)
Pharmacy Patient Advocate Encounter  Prior Authorization for Sheena Craig has been approved by [facility]   Authorization number: [id number] Effective dates: 03/22/2022 through 03/21/2023

## 2022-03-26 ENCOUNTER — Other Ambulatory Visit (HOSPITAL_COMMUNITY): Payer: Self-pay

## 2022-03-26 NOTE — Telephone Encounter (Signed)
Pharmacy Patient Advocate Encounter   Received notification that prior authorization for Prolia is required/requested.  Per Test Claim: prior authorization required (pharmacy benefit)   PA submitted on 03/26/22 to (ins) [employer] via CoverMyMeds Key # [id number] Status is pending

## 2022-04-04 ENCOUNTER — Encounter: Payer: Self-pay | Admitting: Family Medicine

## 2022-04-04 ENCOUNTER — Other Ambulatory Visit (HOSPITAL_COMMUNITY): Payer: Self-pay

## 2022-04-04 NOTE — Telephone Encounter (Signed)
Pt ready for scheduling for Prolia 60mg  on or after : 04/04/22  Out-of-pocket cost due at time of visit: $302  Primary: [employer] Prolia co-insurance: 20% Admin fee co-insurance: 0%  Secondary: --- Prolia co-insurance:  Admin fee co-insurance:   Medical Benefit Details: Date Benefits were checked: 03/15/22 Deductible: NO/ Coinsurance: 20%/ Admin Fee: 0%  Prior Auth: APPROVED PA# [id number]  Expiration Date: 03/21/2023    Pharmacy benefit: Copay $447 (Auth #[id number] Exp: 03/26/23) If patient wants fill through the pharmacy benefit please send prescription to: [facility], and include estimated need by date in rx notes. Pharmacy will ship medication directly to the office.  Patient NOT eligible for Prolia Copay Card. Copay Card can make patient's cost as little as $25. Link to apply: [email or web address]  ** This summary of benefits is an estimation of the patient's out-of-pocket cost. Exact cost may very based on individual plan coverage.

## 2022-04-04 NOTE — Telephone Encounter (Signed)
Patient Advocate Encounter  Prior Authorization for Prolia 60mg  has been approved.    PA# [id number] Effective dates: 03/26/22 through 03/26/23

## 2022-04-05 ENCOUNTER — Encounter: Payer: Self-pay | Admitting: Cardiovascular Disease

## 2022-04-05 DIAGNOSIS — I493 Ventricular premature depolarization: Secondary | ICD-10-CM

## 2022-04-05 DIAGNOSIS — R002 Palpitations: Secondary | ICD-10-CM

## 2022-04-08 ENCOUNTER — Ambulatory Visit: Payer: Medicare Other | Attending: Cardiovascular Disease

## 2022-04-08 DIAGNOSIS — R002 Palpitations: Secondary | ICD-10-CM

## 2022-04-08 DIAGNOSIS — I493 Ventricular premature depolarization: Secondary | ICD-10-CM

## 2022-04-08 NOTE — Progress Notes (Unsigned)
Enrolled for [employer] to mail a ZIO XT long term holter monitor to the patients address on file.  

## 2022-04-10 DIAGNOSIS — R002 Palpitations: Secondary | ICD-10-CM

## 2022-04-10 DIAGNOSIS — I493 Ventricular premature depolarization: Secondary | ICD-10-CM

## 2022-04-24 DIAGNOSIS — I493 Ventricular premature depolarization: Secondary | ICD-10-CM | POA: Diagnosis not present

## 2022-04-24 DIAGNOSIS — R002 Palpitations: Secondary | ICD-10-CM | POA: Diagnosis not present

## 2022-04-25 ENCOUNTER — Encounter: Payer: Self-pay | Admitting: Cardiovascular Disease

## 2022-04-25 ENCOUNTER — Ambulatory Visit: Payer: Medicare Other | Attending: Cardiovascular Disease | Admitting: Cardiovascular Disease

## 2022-04-25 ENCOUNTER — Encounter: Payer: Self-pay | Admitting: Family Medicine

## 2022-04-25 VITALS — BP 120/58 | HR 73 | Ht 65.0 in | Wt 116.4 lb

## 2022-04-25 DIAGNOSIS — I340 Nonrheumatic mitral (valve) insufficiency: Secondary | ICD-10-CM

## 2022-04-25 DIAGNOSIS — I493 Ventricular premature depolarization: Secondary | ICD-10-CM

## 2022-04-25 DIAGNOSIS — I2510 Atherosclerotic heart disease of native coronary artery without angina pectoris: Secondary | ICD-10-CM | POA: Diagnosis not present

## 2022-04-25 DIAGNOSIS — R002 Palpitations: Secondary | ICD-10-CM

## 2022-04-25 NOTE — Patient Instructions (Signed)
Medication Instructions:  No changes *If you need a refill on your cardiac medications before your next appointment, please call your pharmacy*   Lab Work: none   Testing/Procedures: none   Follow-Up: At [facility], you and your health needs are our priority.  As part of our continuing mission to provide you with exceptional heart care, we have created designated Provider Care Teams.  These Care Teams include your primary Cardiologist (physician) and Advanced Practice Providers (APPs -  Physician Assistants and Nurse Practitioners) who all work together to provide you with the care you need, when you need it.   Your next appointment:   6 month(s)  Provider:   Christopher McAlhany, MD   

## 2022-04-25 NOTE — Progress Notes (Signed)
Chief Complaint  Patient presents with   Follow-up    Palpitations   History of Present Illness: 82 yo female with history of anxiety, depression, fibromyalgia, mitral regurgitation, GERD, hyperlipidemia, hypothyroidism, arthritis, CAD and sarcoidosis who is here today for follow up. I saw her as a new consult in February 2021 for the evaluation of abnormal calcium score on CT. She had been seen in 2017 by Dr. Elease HashimotoNahser for atherosclerosis of the aorta and coronaries noted on CT chest as well as orthostatic hypotension. She had no chest pain at that time. Echo in 2014 with normal LV function with mild MR. Coronary calcium score of 486 with calcifications noted in the LAD and RCA in December 2020. Echo February 2021 with LVEF=60-65% with mild MR. Cardiac monitor October 2021 with sinus rhythm with PVCs, 2 runs NSVT (4 and 7 beats). I saw her in the office in November 2021 and she c/o hypotension at home while in hot environments, after meals and when having bowel movements. No syncope. She did not feel her heart race before these episodes. Her events were felt to be vasovagal. Echo April 2023 with LVEF=60-65%. Mild to moderate mitral regurgitation.   She sent us a message in March 2024 reporting that her blood pressure cuff was showing her heart rhythm was irregular. We arranged a Zio cardiac monitor which showed sinus rhythm with PACs and several short runs of SVT. She denies any chest pain, dyspnea, palpitations, lower extremity edema, orthopnea, PND. She still has occasional dizziness.    Primary Care Physician: Charlton AmorWachs, Erika S, DO  Past Medical History:  Diagnosis Date   ANEMIA-NOS 10/17/2006   Anxiety    DEPRESSION 10/17/2006   FIBROMYALGIA 02/09/2007   Fibromyalgia    GERD 10/17/2006   HYPERLIPIDEMIA 10/17/2006   HYPOTHYROIDISM 10/17/2006   LUMBAR RADICULOPATHY, LEFT 10/06/2009   MIGRAINE HEADACHE 10/17/2006   OSTEOARTHRITIS 10/17/2006   OSTEOPENIA 10/17/2006   Osteopenia 11/23/2014    OSTEOPOROSIS 02/09/2007   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   Sarcoidosis 10/17/2006    Past Surgical History:  Procedure Laterality Date   CATARACT EXTRACTION     bilateral   COLONOSCOPY     EXCISION METACARPAL MASS Right 07/24/2015   Procedure: EXCISION OF RIGHT LONG FINGER  MASS;  Surgeon: Mack Hookavid Thompson, MD;  Location: [facility];  Service: Orthopedics;  Laterality: Right;   LYMPH NODE BIOPSY  1968   PELVIC FRACTURE SURGERY  03-04-12    [facility]   UPPER GASTROINTESTINAL ENDOSCOPY      Current Outpatient Medications  Medication Sig Dispense Refill   Calcium-Vitamin D-Vitamin K (CALCIUM SOFT CHEWS PO) Take 3 tablets by mouth daily.     cyanocobalamin (VITAMIN B12) 1000 MCG/ML injection INJECT 1 ML INTRAMUSCULARLY MONTHLY AS DIRECTED 3 mL 5   dextromethorphan-guaiFENesin (MUCINEX DM) 30-600 MG per 12 hr tablet Take 1 tablet by mouth 2 (two) times daily as needed for cough.     docusate sodium (COLACE) 100 MG capsule Take 100 mg by mouth daily as needed for mild constipation.     FLUoxetine (PROZAC) 10 MG capsule Take 1 capsule (10 mg total) by mouth daily. 90 capsule 3   FOLIC ACID PO Take 1 tablet by mouth 3 (three) times a week.     levothyroxine (SYNTHROID) 100 MCG tablet Take 1 tablet (100 mcg total) by mouth daily. 90 tablet 3   NONFORMULARY OR COMPOUNDED ITEM 1 tablet daily.     NONFORMULARY OR COMPOUNDED ITEM 1 tablet daily.  rosuvastatin (CRESTOR) 10 MG tablet Take 1 tablet (10 mg total) by mouth daily. 90 tablet 3   SYRINGE-NEEDLE, DISP, 3 ML (B-D 3CC LUER-LOK SYR 25GX1") 25G X 1" 3 ML MISC USE FOR B12 SHOTS ONCE A MONTH 100 each 1   No current facility-administered medications for this visit.    Allergies  Allergen Reactions   Adhesive [Tape]     Causes skin breakdown   Aspartame And Phenylalanine Other (See Comments)    GI upset,&pain   Cefdinir Nausea And Vomiting   Dimetapp Children's Cold-Cough     Other Reaction(s): Not  available   Pravastatin Sodium     Other Reaction(s): Not available   Other    Pseudoephedrine Other (See Comments)    "hyperactive"  Other Reaction(s): Not available    Social History   Socioeconomic History   Marital status: Married    Spouse name: Not on file   Number of children: 2   Years of education: Not on file   Highest education level: Not on file  Occupational History   Occupation: psychology-counselor  Tobacco Use   Smoking status: Never    Passive exposure: Never   Smokeless tobacco: Never  Vaping Use   Vaping Use: Never used  Substance and Sexual Activity   Alcohol use: No   Drug use: No   Sexual activity: Yes    Partners: Male    Birth control/protection: Post-menopausal  Other Topics Concern   Not on file  Social History Narrative   Right handed   One story home   Social Determinants of Health   Financial Resource Strain: Low Risk  (02/26/2021)   Overall Financial Resource Strain (CARDIA)    Difficulty of Paying Living Expenses: Not hard at all  Food Insecurity: No Food Insecurity (02/26/2021)   Hunger Vital Sign    Worried About Running Out of Food in the Last Year: Never true    Ran Out of Food in the Last Year: Never true  Transportation Needs: No Transportation Needs (02/26/2021)   PRAPARE - Administrator, Civil Service (Medical): No    Lack of Transportation (Non-Medical): No  Physical Activity: Sufficiently Active (02/26/2021)   Exercise Vital Sign    Days of Exercise per Week: 5 days    Minutes of Exercise per Session: 30 min  Stress: No Stress Concern Present (02/26/2021)   [employer] of Occupational Health - Occupational Stress Questionnaire    Feeling of Stress : Not at all  Social Connections: Socially Integrated (02/26/2021)   Social Connection and Isolation Panel [NHANES]    Frequency of Communication with Friends and Family: More than three times a week    Frequency of Social Gatherings with Friends and Family:  More than three times a week    Attends Religious Services: More than 4 times per year    Active Member of [employer] or Organizations: Yes    Attends Engineer, structural: More than 4 times per year    Marital Status: Married  Catering manager Violence: Not At Risk (02/26/2021)   Humiliation, Afraid, Rape, and Kick questionnaire    Fear of Current or Ex-Partner: No    Emotionally Abused: No    Physically Abused: No    Sexually Abused: No    Family History  Problem Relation Age of Onset   Hypertension Mother    Anxiety disorder Mother    Heart disease Mother    Rheum arthritis Mother    Alcohol abuse Father  Anxiety disorder Father    Heart disease Father    Rheum arthritis Father    Aneurysm Father    Skin cancer Father    Hypertension Father    Uterine cancer Sister    Heart disease Brother    Heart attack Brother    Breast cancer Maternal Aunt    Celiac disease Other        nieces and nephews   Colon cancer Neg Hx     Review of Systems:  As stated in the HPI and otherwise negative.   BP (!) 120/58   Pulse 73   Ht 5\' 5"  (1.651 m)   Wt 52.8 kg   SpO2 96%   BMI 19.37 kg/m   Physical Examination:  General: Well developed, well nourished, NAD  HEENT: OP clear, mucus membranes moist  SKIN: warm, dry. No rashes. Neuro: No focal deficits  Musculoskeletal: Muscle strength 5/5 all ext  Psychiatric: Mood and affect normal  Neck: No JVD, no carotid bruits, no thyromegaly, no lymphadenopathy.  Lungs:Clear bilaterally, no wheezes, rhonci, crackles Cardiovascular: Regular rate and rhythm. No murmurs, gallops or rubs. Abdomen:Soft. Bowel sounds present. Non-tender.  Extremities: No lower extremity edema. Pulses are 2 + in the bilateral DP/PT.  EKG:  EKG is  ordered today. The ekg ordered today demonstrates NSR  Echo April 2023: 1. Left ventricular ejection fraction, by estimation, is 60 to 65%. Left  ventricular ejection fraction by 3D volume is 66 %. The left  ventricle has  normal function. The left ventricle has no regional wall motion  abnormalities. There is moderate  concentric left ventricular hypertrophy. Left ventricular diastolic  parameters were normal. The average left ventricular global longitudinal  strain is -20.3 %. The global longitudinal strain is normal.   2. Right ventricular systolic function is normal. The right ventricular  size is normal. There is normal pulmonary artery systolic pressure.   3. Left atrial size was mildly dilated.   4. The mitral valve is myxomatous. Mild to moderate mitral valve  regurgitation. No evidence of mitral stenosis. 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   5. The tricuspid valve is myxomatous.   6. The aortic valve is tricuspid. There is mild thickening of the aortic  valve. Aortic valve regurgitation is not visualized. Aortic valve  sclerosis is present, with no evidence of aortic valve stenosis.   7. The inferior vena cava is normal in size with greater than 50%  respiratory variability, suggesting right atrial pressure of 3 mmHg.   Recent Labs: 08/24/2021: ALT 14; BUN 15; Creatinine, Ser 0.77; Hemoglobin 13.1; Platelets 374.0; Potassium 4.4; Sodium 137; TSH 16.16   Lipid Panel    Component Value Date/Time   CHOL 193 08/24/2021 0914   TRIG 108.0 08/24/2021 0914   HDL 74.40 08/24/2021 0914   CHOLHDL 3 08/24/2021 0914   VLDL 21.6 08/24/2021 0914   LDLCALC 97 08/24/2021 0914   LDLDIRECT 127.0 11/11/2014 1616     Wt Readings from Last 3 Encounters:  04/25/22 52.8 kg  11/02/21 57.2 kg  08/30/21 55.3 kg    Assessment and Plan:   1. Mitral regurgitation: Mild to moderate MR by echo in April 2023.    2. CAD without angina: Coronary calcification noted on chest CT in 2014 and on most recent calcium score by CT in 2020. No chest pain suggestive of angina. Continue ASA and statin.   3. PVCs/SVT: Cardiac monitor April 2024 with sinus with PACs  with  several short runs of SVT. LV function is normal by echo in 2023. No changes  Labs/ tests ordered today include:   Orders Placed This Encounter  Procedures   EKG 12-Lead   Disposition:   F/U with me in 12 months.   Signed, Verne Carrow, MD 04/25/2022 3:15 PM    [facility] [address] [place], [place], [place]  [address] Phone: [phone]; Fax: [phone]

## 2022-04-26 ENCOUNTER — Encounter: Payer: Self-pay | Admitting: Cardiovascular Disease

## 2022-04-28 NOTE — Progress Notes (Unsigned)
     Established patient visit   Patient: Sheena Craig   DOB: Nov 10, 1940   82 y.o. Female  MRN: [id number] Visit Date: 04/29/2022  Today's healthcare provider: Charlton Amor, DO   No chief complaint on file.   SUBJECTIVE   No chief complaint on file.  HPI    Review of Systems     No outpatient medications have been marked as taking for the 04/29/22 encounter (Appointment) with Charlton Amor, DO.    OBJECTIVE    There were no vitals taken for this visit.  Physical Exam   {Show previous labs (optional):23736}    ASSESSMENT & PLAN    Problem List Items Addressed This Visit   None   No follow-ups on file.      No orders of the defined types were placed in this encounter.   No orders of the defined types were placed in this encounter.    Charlton Amor, DO  [facility] at [facility] [phone] (phone) [phone] (fax)  [facility]

## 2022-04-29 ENCOUNTER — Encounter: Payer: Self-pay | Admitting: Family Medicine

## 2022-04-29 ENCOUNTER — Ambulatory Visit (INDEPENDENT_AMBULATORY_CARE_PROVIDER_SITE_OTHER): Payer: Medicare Other

## 2022-04-29 ENCOUNTER — Ambulatory Visit (INDEPENDENT_AMBULATORY_CARE_PROVIDER_SITE_OTHER): Payer: Medicare Other | Admitting: Family Medicine

## 2022-04-29 VITALS — BP 139/63 | HR 78 | Temp 98.4°F | Ht 65.0 in | Wt 122.1 lb

## 2022-04-29 DIAGNOSIS — K5900 Constipation, unspecified: Secondary | ICD-10-CM | POA: Diagnosis not present

## 2022-04-29 DIAGNOSIS — E538 Deficiency of other specified B group vitamins: Secondary | ICD-10-CM

## 2022-04-29 DIAGNOSIS — R634 Abnormal weight loss: Secondary | ICD-10-CM

## 2022-04-29 DIAGNOSIS — K5909 Other constipation: Secondary | ICD-10-CM

## 2022-04-29 DIAGNOSIS — E039 Hypothyroidism, unspecified: Secondary | ICD-10-CM

## 2022-04-29 LAB — CBC
MCH: 30.2 pg (ref 27.0–33.0)
MPV: 9.5 fL (ref 7.5–12.5)

## 2022-04-29 MED ORDER — BD LUER-LOK SYRINGE 25G X 1" 3 ML MISC
1 refills | Status: DC
Start: 2022-04-29 — End: 2022-09-02

## 2022-04-29 NOTE — Assessment & Plan Note (Signed)
-   recommend increasing fiber in diet - sounds related to lack of appetite - the diarrhea she describes I am wondering if this is actually constipation will loose stool down the sides. - have sent referral to GI since she is having pain and it has been >10 years of colonoscopy and weight loss.

## 2022-04-29 NOTE — Assessment & Plan Note (Signed)
-   refill on lancets

## 2022-04-29 NOTE — Assessment & Plan Note (Signed)
-   pt has lost 10lbs in the last 6 months or so likely due to her constipaton and lack of appetite. Will go ahead and order cbc to assess for white count and order tsh to see if this is related.

## 2022-04-29 NOTE — Assessment & Plan Note (Signed)
-   ordered TSH 

## 2022-04-30 ENCOUNTER — Encounter: Payer: Self-pay | Admitting: Family Medicine

## 2022-04-30 LAB — COMPLETE METABOLIC PANEL WITH GFR
AG Ratio: 1.6 (calc) (ref 1.0–2.5)
ALT: 11 U/L (ref 6–29)
AST: 16 U/L (ref 10–35)
Albumin: 3.9 g/dL (ref 3.6–5.1)
Alkaline phosphatase (APISO): 40 U/L (ref 37–153)
BUN: 13 mg/dL (ref 7–25)
CO2: 28 mmol/L (ref 20–32)
Calcium: 9.6 mg/dL (ref 8.6–10.4)
Chloride: 102 mmol/L (ref 98–110)
Creat: 0.75 mg/dL (ref 0.60–0.95)
Globulin: 2.5 g/dL (calc) (ref 1.9–3.7)
Glucose, Bld: 100 mg/dL — ABNORMAL HIGH (ref 65–99)
Potassium: 4.6 mmol/L (ref 3.5–5.3)
Sodium: 137 mmol/L (ref 135–146)
Total Bilirubin: 0.3 mg/dL (ref 0.2–1.2)
Total Protein: 6.4 g/dL (ref 6.1–8.1)
eGFR: 80 mL/min/{1.73_m2} (ref >=60)

## 2022-04-30 LAB — CBC
HCT: 37.2 % (ref 35.0–45.0)
Hemoglobin: 12.2 g/dL (ref 11.7–15.5)
MCHC: 32.8 g/dL (ref 32.0–36.0)
MCV: 92.1 fL (ref 80.0–100.0)
Platelets: 437 10*3/uL — ABNORMAL HIGH (ref 140–400)
RBC: 4.04 10*6/uL (ref 3.80–5.10)
RDW: 11.8 % (ref 11.0–15.0)
WBC: 7.3 10*3/uL (ref 3.8–10.8)

## 2022-04-30 LAB — TSH: TSH: 2 mIU/L (ref 0.40–4.50)

## 2022-05-02 ENCOUNTER — Encounter: Payer: Self-pay | Admitting: Family Medicine

## 2022-05-02 ENCOUNTER — Other Ambulatory Visit: Payer: Self-pay | Admitting: Family Medicine

## 2022-05-02 DIAGNOSIS — K5900 Constipation, unspecified: Secondary | ICD-10-CM

## 2022-05-02 MED ORDER — GLYCERIN (ADULT) 2 G RE SUPP
1.0000 | RECTAL | 0 refills | Status: DC | PRN
Start: 2022-05-02 — End: 2022-10-18

## 2022-05-06 ENCOUNTER — Ambulatory Visit: Payer: Medicare Other | Admitting: Family Medicine

## 2022-05-07 ENCOUNTER — Telehealth: Payer: Self-pay

## 2022-05-07 ENCOUNTER — Other Ambulatory Visit: Payer: Self-pay | Admitting: Family Medicine

## 2022-05-07 NOTE — Telephone Encounter (Signed)
Patient scheduled for Prolia injection. 

## 2022-05-07 NOTE — Telephone Encounter (Signed)
See MyChart message with Prolia approval. 05/07/22  Approval number [id number]

## 2022-05-08 ENCOUNTER — Ambulatory Visit (INDEPENDENT_AMBULATORY_CARE_PROVIDER_SITE_OTHER): Payer: Medicare Other | Admitting: Family Medicine

## 2022-05-08 VITALS — BP 103/58 | HR 71 | Ht 65.0 in

## 2022-05-08 DIAGNOSIS — M810 Age-related osteoporosis without current pathological fracture: Secondary | ICD-10-CM

## 2022-05-08 MED ORDER — DENOSUMAB 60 MG/ML ~~LOC~~ SOSY
60.0000 mg | PREFILLED_SYRINGE | Freq: Once | SUBCUTANEOUS | Status: AC
Start: 2022-05-08 — End: 2022-05-08
  Administered 2022-05-08: 60 mg via SUBCUTANEOUS

## 2022-05-08 NOTE — Patient Instructions (Signed)
Return in 6 months and one day for next prolia injection.

## 2022-05-08 NOTE — Progress Notes (Signed)
   Established Patient Office Visit  Subjective   Patient ID: Sheena Craig, female    DOB: 1941/01/09  Age: 82 y.o. MRN: [id number]  Chief Complaint  Patient presents with   Osteoporosis    Prolia injection nurse visit.     HPI  Osteoporosis -prolia injection nurse visit.  Patient verified that she is taking a daily Vit D and  calcium supplement. Patient's calcium and kidney function were within normal limits Patient had low BP diastolic reading at check in.  Patient states this is common for her and cardiologist is aware. Verified with Dr. Tamera Punt that O.k. to continue with Prolia injection with low BP readings today.   ROS    Objective:     BP (!) 121/48   Pulse 71   Ht  (1.651 m)   SpO2 98%   BMI 20.32 kg/m    Physical Exam   No results found for any visits on 05/08/22.    The ASCVD Risk score (Arnett DK, et al., 2019) failed to calculate for the following reasons:   The 2019 ASCVD risk score is only valid for ages 40 to 43    Assessment & Plan:  Prolia injection- admin  SQ left upper arm . Patient tolerated injection well without complications. Patient advised to schedule next injection in 6 months and one day.  Problem List Items Addressed This Visit   None   No follow-ups on file.    Elizabeth Palau, LPN  Medical screening examination/treatment was performed by qualified clinical staff member and as supervising physician I was immediately available for consultation/collaboration. I have reviewed documentation and agree with assessment and plan.  Colbert Coyer. Tamera Punt, DO

## 2022-05-13 DIAGNOSIS — F4320 Adjustment disorder, unspecified: Secondary | ICD-10-CM | POA: Diagnosis not present

## 2022-05-31 DIAGNOSIS — Z961 Presence of intraocular lens: Secondary | ICD-10-CM | POA: Diagnosis not present

## 2022-05-31 DIAGNOSIS — H35363 Drusen (degenerative) of macula, bilateral: Secondary | ICD-10-CM | POA: Diagnosis not present

## 2022-05-31 DIAGNOSIS — H16223 Keratoconjunctivitis sicca, not specified as Sjogren's, bilateral: Secondary | ICD-10-CM | POA: Diagnosis not present

## 2022-05-31 DIAGNOSIS — H18452 Nodular corneal degeneration, left eye: Secondary | ICD-10-CM | POA: Diagnosis not present

## 2022-06-05 ENCOUNTER — Telehealth: Payer: Self-pay | Admitting: Family Medicine

## 2022-06-05 NOTE — Telephone Encounter (Signed)
Pt called. She is requesting a refill on her Simvastatin 10mg  Pharm[facility]: [facility], [place]

## 2022-06-06 ENCOUNTER — Other Ambulatory Visit: Payer: Self-pay | Admitting: Family Medicine

## 2022-06-06 MED ORDER — ROSUVASTATIN CALCIUM 10 MG PO TABS: 10.0000 mg | ORAL_TABLET | Freq: Every day | ORAL | 3 refills | Status: DC

## 2022-06-06 NOTE — Telephone Encounter (Signed)
sent 

## 2022-06-06 NOTE — Telephone Encounter (Signed)
Thank you.  Pt aware.

## 2022-06-14 ENCOUNTER — Telehealth: Payer: Self-pay | Admitting: Family Medicine

## 2022-06-14 NOTE — Telephone Encounter (Signed)
Patient called requesting a refill of ;  Vitamin B12 1000 mcg/ml injection  Pharmacy ;  [facility] on [address] [place]  [phone]

## 2022-06-17 DIAGNOSIS — F4320 Adjustment disorder, unspecified: Secondary | ICD-10-CM | POA: Diagnosis not present

## 2022-06-17 MED ORDER — CYANOCOBALAMIN 1000 MCG/ML IJ SOLN: INTRAMUSCULAR | 0 refills | Status: DC

## 2022-06-18 DIAGNOSIS — Z780 Asymptomatic menopausal state: Secondary | ICD-10-CM | POA: Diagnosis not present

## 2022-06-18 DIAGNOSIS — Z01419 Encounter for gynecological examination (general) (routine) without abnormal findings: Secondary | ICD-10-CM | POA: Diagnosis not present

## 2022-06-18 DIAGNOSIS — Z1231 Encounter for screening mammogram for malignant neoplasm of breast: Secondary | ICD-10-CM | POA: Diagnosis not present

## 2022-06-20 DIAGNOSIS — R079 Chest pain, unspecified: Secondary | ICD-10-CM | POA: Diagnosis not present

## 2022-06-20 DIAGNOSIS — F419 Anxiety disorder, unspecified: Secondary | ICD-10-CM | POA: Diagnosis not present

## 2022-06-20 DIAGNOSIS — I2510 Atherosclerotic heart disease of native coronary artery without angina pectoris: Secondary | ICD-10-CM | POA: Diagnosis not present

## 2022-06-20 DIAGNOSIS — R0789 Other chest pain: Secondary | ICD-10-CM | POA: Diagnosis not present

## 2022-06-20 DIAGNOSIS — K5900 Constipation, unspecified: Secondary | ICD-10-CM | POA: Diagnosis not present

## 2022-06-20 DIAGNOSIS — E039 Hypothyroidism, unspecified: Secondary | ICD-10-CM | POA: Diagnosis not present

## 2022-06-20 DIAGNOSIS — I959 Hypotension, unspecified: Secondary | ICD-10-CM | POA: Diagnosis not present

## 2022-06-20 DIAGNOSIS — Z8249 Family history of ischemic heart disease and other diseases of the circulatory system: Secondary | ICD-10-CM | POA: Diagnosis not present

## 2022-06-20 DIAGNOSIS — E538 Deficiency of other specified B group vitamins: Secondary | ICD-10-CM | POA: Diagnosis not present

## 2022-06-20 DIAGNOSIS — Z79899 Other long term (current) drug therapy: Secondary | ICD-10-CM | POA: Diagnosis not present

## 2022-06-20 DIAGNOSIS — I214 Non-ST elevation (NSTEMI) myocardial infarction: Secondary | ICD-10-CM | POA: Diagnosis not present

## 2022-06-20 DIAGNOSIS — I700 Atherosclerosis of aorta: Secondary | ICD-10-CM | POA: Diagnosis not present

## 2022-06-20 DIAGNOSIS — R231 Pallor: Secondary | ICD-10-CM | POA: Diagnosis not present

## 2022-06-20 DIAGNOSIS — E785 Hyperlipidemia, unspecified: Secondary | ICD-10-CM | POA: Diagnosis not present

## 2022-06-20 DIAGNOSIS — M1990 Unspecified osteoarthritis, unspecified site: Secondary | ICD-10-CM | POA: Diagnosis not present

## 2022-06-20 DIAGNOSIS — K219 Gastro-esophageal reflux disease without esophagitis: Secondary | ICD-10-CM | POA: Diagnosis not present

## 2022-06-20 DIAGNOSIS — F32A Depression, unspecified: Secondary | ICD-10-CM | POA: Diagnosis not present

## 2022-06-20 DIAGNOSIS — D869 Sarcoidosis, unspecified: Secondary | ICD-10-CM | POA: Diagnosis not present

## 2022-06-20 DIAGNOSIS — I10 Essential (primary) hypertension: Secondary | ICD-10-CM | POA: Diagnosis not present

## 2022-06-20 DIAGNOSIS — I340 Nonrheumatic mitral (valve) insufficiency: Secondary | ICD-10-CM | POA: Diagnosis not present

## 2022-06-20 DIAGNOSIS — Z91040 Latex allergy status: Secondary | ICD-10-CM | POA: Diagnosis not present

## 2022-06-20 DIAGNOSIS — M797 Fibromyalgia: Secondary | ICD-10-CM | POA: Diagnosis not present

## 2022-06-21 DIAGNOSIS — I2584 Coronary atherosclerosis due to calcified coronary lesion: Secondary | ICD-10-CM | POA: Diagnosis not present

## 2022-06-21 DIAGNOSIS — I2510 Atherosclerotic heart disease of native coronary artery without angina pectoris: Secondary | ICD-10-CM | POA: Diagnosis not present

## 2022-06-21 DIAGNOSIS — I214 Non-ST elevation (NSTEMI) myocardial infarction: Secondary | ICD-10-CM | POA: Diagnosis not present

## 2022-06-21 DIAGNOSIS — R079 Chest pain, unspecified: Secondary | ICD-10-CM | POA: Diagnosis not present

## 2022-06-21 DIAGNOSIS — I491 Atrial premature depolarization: Secondary | ICD-10-CM | POA: Diagnosis not present

## 2022-06-22 ENCOUNTER — Encounter: Payer: Self-pay | Admitting: Cardiovascular Disease

## 2022-06-22 ENCOUNTER — Encounter: Payer: Self-pay | Admitting: Family Medicine

## 2022-06-22 DIAGNOSIS — I517 Cardiomegaly: Secondary | ICD-10-CM | POA: Diagnosis not present

## 2022-06-22 DIAGNOSIS — I214 Non-ST elevation (NSTEMI) myocardial infarction: Secondary | ICD-10-CM | POA: Diagnosis not present

## 2022-06-22 DIAGNOSIS — I358 Other nonrheumatic aortic valve disorders: Secondary | ICD-10-CM | POA: Diagnosis not present

## 2022-06-25 ENCOUNTER — Ambulatory Visit (INDEPENDENT_AMBULATORY_CARE_PROVIDER_SITE_OTHER): Payer: Medicare Other | Admitting: Family Medicine

## 2022-06-25 ENCOUNTER — Encounter: Payer: Self-pay | Admitting: Family Medicine

## 2022-06-25 VITALS — BP 126/66 | HR 63 | Temp 99.0°F | Resp 18 | Ht 65.0 in | Wt 120.2 lb

## 2022-06-25 DIAGNOSIS — F411 Generalized anxiety disorder: Secondary | ICD-10-CM | POA: Diagnosis not present

## 2022-06-25 DIAGNOSIS — Z09 Encounter for follow-up examination after completed treatment for conditions other than malignant neoplasm: Secondary | ICD-10-CM | POA: Insufficient documentation

## 2022-06-25 NOTE — Assessment & Plan Note (Signed)
-   pt feels she can get off prozac. We will taper. Also prozac can lessen effects of plavix so the fact that patient feels she can get off prozac will be helpful

## 2022-06-25 NOTE — Patient Instructions (Signed)
Taper prozac - one tab every other day for two weeks  Then stop

## 2022-06-25 NOTE — Progress Notes (Signed)
Established patient visit   Patient: Sheena Craig   DOB: 1940-11-01   82 y.o. Female  MRN: [id number] Visit Date: 06/25/2022  Today's healthcare provider: Charlton Amor, DO   Chief Complaint  Patient presents with   Hospitalization Follow-up    SUBJECTIVE    Chief Complaint  Patient presents with   Hospitalization Follow-up   HPI   Pt presents for hospital follow up. She was seen in the hospital and admitted for NSTEMI. Pt was advised to undergo cardiac rehab program and starts in a week. Doing well no concerns.   Thinks she can get off prozac.  Review of Systems  Constitutional:  Negative for activity change, fatigue and fever.  Respiratory:  Negative for cough and shortness of breath.   Cardiovascular:  Negative for chest pain.  Gastrointestinal:  Negative for abdominal pain.  Genitourinary:  Negative for difficulty urinating.       Current Meds  Medication Sig   aspirin 81 MG chewable tablet Chew by mouth.   Calcium-Vitamin D-Vitamin K (CALCIUM SOFT CHEWS PO) Take 3 tablets by mouth daily.   clopidogrel (PLAVIX) 75 MG tablet Take 1 tablet by mouth daily.   cyanocobalamin (VITAMIN B12) 1000 MCG/ML injection INJECT 1 ML INTRAMUSCULARLY MONTHLY AS DIRECTED   dextromethorphan-guaiFENesin (MUCINEX DM) 30-600 MG per 12 hr tablet Take 1 tablet by mouth 2 (two) times daily as needed for cough.   docusate sodium (COLACE) 100 MG capsule Take 100 mg by mouth daily as needed for mild constipation.   FLUoxetine (PROZAC) 10 MG capsule Take 1 capsule (10 mg total) by mouth daily.   FOLIC ACID PO Take 1 tablet by mouth 3 (three) times a week.   glycerin adult 2 g suppository Place 1 suppository rectally as needed for constipation.   levothyroxine (SYNTHROID) 100 MCG tablet Take 1 tablet (100 mcg total) by mouth daily.   metoprolol succinate (TOPROL-XL) 25 MG 24 hr tablet Take 0.5 tablets by mouth daily.   NONFORMULARY OR COMPOUNDED ITEM 1 tablet daily.    NONFORMULARY OR COMPOUNDED ITEM 1 tablet daily.   rosuvastatin (CRESTOR) 10 MG tablet Take 1 tablet (10 mg total) by mouth daily.   SYRINGE-NEEDLE, DISP, 3 ML (B-D 3CC LUER-LOK SYR 25GX1") 25G X 1" 3 ML MISC USE FOR B12 SHOTS ONCE A MONTH    OBJECTIVE    BP 126/66   Pulse 63   Temp 99 F (37.2 C)   Resp 18   Ht 5\' 5"  (1.651 m)   Wt 120 lb 4 oz (54.5 kg)   BMI 20.01 kg/m   Physical Exam Vitals and nursing note reviewed.  Constitutional:      General: She is not in acute distress.    Appearance: Normal appearance.  HENT:     Head: Normocephalic and atraumatic.     Right Ear: External ear normal.     Left Ear: External ear normal.     Nose: Nose normal.  Eyes:     Conjunctiva/sclera: Conjunctivae normal.  Cardiovascular:     Rate and Rhythm: Normal rate and regular rhythm.  Pulmonary:     Effort: Pulmonary effort is normal.     Breath sounds: Normal breath sounds.  Neurological:     General: No focal deficit present.     Mental Status: She is alert and oriented to person, place, and time.  Psychiatric:        Mood and Affect: Mood normal.  Behavior: Behavior normal.        Thought Content: Thought content normal.        Judgment: Judgment normal.          ASSESSMENT & PLAN    Problem List Items Addressed This Visit       Other   Hospital discharge follow-up - Primary    - pt doing well post NSTEMI - following up with cardiology      GAD (generalized anxiety disorder)    - pt feels she can get off prozac. We will taper. Also prozac can lessen effects of plavix so the fact that patient feels she can get off prozac will be helpful       No follow-ups on file.      No orders of the defined types were placed in this encounter.   No orders of the defined types were placed in this encounter.    Charlton Amor, DO  [facility] at [facility] [phone] (phone) [phone] (fax)  [facility]

## 2022-06-25 NOTE — Assessment & Plan Note (Signed)
-   pt doing well post NSTEMI - following up with cardiology

## 2022-07-01 ENCOUNTER — Encounter: Payer: Self-pay | Admitting: Cardiovascular Disease

## 2022-07-02 ENCOUNTER — Other Ambulatory Visit: Payer: Self-pay | Admitting: Family Medicine

## 2022-07-02 DIAGNOSIS — K5904 Chronic idiopathic constipation: Secondary | ICD-10-CM

## 2022-07-02 MED ORDER — LINACLOTIDE 145 MCG PO CAPS
145.0000 ug | ORAL_CAPSULE | Freq: Every day | ORAL | 0 refills | Status: DC
Start: 2022-07-02 — End: 2022-10-18

## 2022-07-04 ENCOUNTER — Encounter: Payer: Self-pay | Admitting: Cardiovascular Disease

## 2022-07-04 ENCOUNTER — Encounter: Payer: Self-pay | Admitting: Family Medicine

## 2022-07-06 NOTE — Progress Notes (Unsigned)
Cardiology Office Note:  .   Date:  07/08/2022  ID:  MELESSIA KAUS, DOB 1941/01/11, MRN [id number] PCP: Charlton Amor, DO  [facility] Providers Cardiologist:  Verne Carrow, MD    History of Present Illness: .   Sheena Craig is a 82 y.o. female CAD s/p NSTEMI, mitral regurgitation, anxiety, depression, fibromyalgia, hypotension, hyperlipidemia, palpitations, SVT, hypothyroidism, arthritis, GERD, and sarcoidosis      Admission 6/6-06/22/2022 [facility] [place] with NSTEMI with troponin peak at 15,000.  TTE overall unremarkable.  Cardiac catheterization response field 100% occlusion of R PAV with recommendations for optimization of medical therapy, no stents placed. DAPT x 12 months, low dose BB, borderline hypotension prevented initiation of ACE/ARB/ARNI.     ROS: Hypotension and lightheadedness. Was taking 12.5 mg metoprolol which was stopped on 07/02/22. Severe constipation since April, feeling some improvement since starting Linzess.  Symptom of angina was midsternal chest pain, no further episodes since discharge. She denies shortness of breath, orthopnea, PND, palpitations. Activity tolerance impacted by hypotension on metoprolol, she is feeling better today.   Studies Reviewed: Marland Kitchen   EKG Interpretation  Date/Time:  Monday July 08 2022 14:59:00 EDT Ventricular Rate:  83 PR Interval:  136 QRS Duration: 78 QT Interval:  382 QTC Calculation: 448 R Axis:   86 Text Interpretation: Normal sinus rhythm Normal ECG No previous ECGs available with occasional Premature atrial complexes Confirmed by Eligha Bridegroom [phone]) on 07/08/2022 3:06:40 PM     Cardiac catheterization @ [facility] [place] 06/21/22 Conclusions:   Occlusion of the distal right coronary at the last posterolateral branch.  Nonobstructive coronary artery disease  normal Left ventricular systolic function  There were no complications   Coronary Findings Diagnostic Dominance: Right  Left  Anterior Descending: Prox LAD lesion is 15% stenosed. The lesion is mildly calcified.  Ramus Intermedius: The vessel exhibits minimal luminal irregularities.  Left Circumflex: The vessel exhibits minimal luminal irregularities.  Right Coronary Artery: Prox RCA lesion is 40% stenosed. Dist RCA lesion is 10% stenosed. Right Posterior Atrioventricular Artery: RPAV lesion is 100% stenosed.    RECOMMENDATION:  Medical therapy for Coronary artery disease risk factor reduction and  Myocardial infarction  The vessel is very small and her LV function is normal.  If she does not  have further angina there is no reason to intervene on this.    Risk Assessment/Calculations:             Physical Exam:   VS:  BP 100/60   Pulse 83   Ht 5\' 5"  (1.651 m)   Wt 117 lb 3.2 oz (53.2 kg)   SpO2 95%   BMI 19.50 kg/m    Wt Readings from Last 3 Encounters:  07/08/22 117 lb 3.2 oz (53.2 kg)  06/25/22 120 lb 4 oz (54.5 kg)  04/29/22 122 lb 1.3 oz (55.4 kg)    GEN: Well nourished, well developed in no acute distress NECK: No JVD; No carotid bruits CARDIAC: RRR, no murmurs, rubs, gallops RESPIRATORY:  Clear to auscultation without rales, wheezing or rhonchi  ABDOMEN: Soft, non-tender, non-distended EXTREMITIES:  No edema; No deformity   ASSESSMENT AND PLAN: .    CAD without angina: S/p NSTEMI 06/20/22 with 100% occlusion RPAV (very small vessel), nonobstructive CAD in LAD, prox RCA. Reports symptom of angina was mid-sternal chest pain. No further episodes of chest pain since hospital d/c. No concerning symptoms for angina. No indication for further ischemic evaluation at this time. GDMT limited by  hypotension. Rosuvastatin was doubled from 10 mg to 20 mg. We will recheck lipid panel in 3 months. No bleeding concerns. Continue aspirin, Plavix. Discussed no interruption in DAPT for at least 6 months should she need hip injection in the future.   Hypotension: Lengthy history of orthostatic hypotension.  Discharged 06/22/22 on metoprolol 12.5 mg daily, could not tolerate due to symptomatic hypotension. She is liberalizing salt, we discussed ways to do this in a healthy way.  Continue good high duration and nutrition.  Hyperlipidemia LDL goal < 55: LDL 76 on 06/20/22. Rosuvastatin was doubled to 20 mg during admission. We will recheck FLP in 3 months, prior to next office visit.   Palpitations: Quiescent at this time. Has been unable to tolerate low dose AV nodal blocking agents.   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 She is asymptomatic.  We will continue to monitor clinically at this time.       Dispo: 4 months with Dr. Clifton James  Signed, Sheena Craig, Zachary George, NP

## 2022-07-08 ENCOUNTER — Encounter: Payer: Self-pay | Admitting: Nurse Practitioner

## 2022-07-08 ENCOUNTER — Ambulatory Visit: Payer: Medicare Other | Attending: Nurse Practitioner | Admitting: Nurse Practitioner

## 2022-07-08 VITALS — BP 100/60 | HR 83 | Ht 65.0 in | Wt 117.2 lb

## 2022-07-08 DIAGNOSIS — E785 Hyperlipidemia, unspecified: Secondary | ICD-10-CM

## 2022-07-08 DIAGNOSIS — I2510 Atherosclerotic heart disease of native coronary artery without angina pectoris: Secondary | ICD-10-CM | POA: Diagnosis not present

## 2022-07-08 DIAGNOSIS — R002 Palpitations: Secondary | ICD-10-CM | POA: Diagnosis not present

## 2022-07-08 DIAGNOSIS — I340 Nonrheumatic mitral (valve) insufficiency: Secondary | ICD-10-CM

## 2022-07-08 DIAGNOSIS — I951 Orthostatic hypotension: Secondary | ICD-10-CM | POA: Diagnosis not present

## 2022-07-08 NOTE — Patient Instructions (Signed)
Medication Instructions:   Your physician recommends that you continue on your current medications as directed. Please refer to the Current Medication list given to you today.   *If you need a refill on your cardiac medications before your next appointment, please call your pharmacy*   Lab Work:  Your physician recommends that you return for a FASTING lipid profile/cmet/lipo a/Friday, September 27. You can come in on the day of your appointment anytime between 7:30-4:30 fasting from midnight the night before.     If you have labs (blood work) drawn today and your tests are completely normal, you will receive your results only by: MyChart Message (if you have MyChart) OR A paper copy in the mail If you have any lab test that is abnormal or we need to change your treatment, we will call you to review the results.   Testing/Procedures:  None ordered.   Follow-Up: At [facility], you and your health needs are our priority.  As part of our continuing mission to provide you with exceptional heart care, we have created designated Provider Care Teams.  These Care Teams include your primary Cardiologist (physician) and Advanced Practice Providers (APPs -  Physician Assistants and Nurse Practitioners) who all work together to provide you with the care you need, when you need it.  We recommend signing up for the patient portal called "MyChart".  Sign up information is provided on this After Visit Summary.  MyChart is used to connect with patients for Virtual Visits (Telemedicine).  Patients are able to view lab/test results, encounter notes, upcoming appointments, etc.  Non-urgent messages can be sent to your provider as well.   To learn more about what you can do with MyChart, go to [email or web address].    Your next appointment:   4 month(s)  Provider:   Verne Carrow, MD

## 2022-07-09 DIAGNOSIS — I214 Non-ST elevation (NSTEMI) myocardial infarction: Secondary | ICD-10-CM | POA: Diagnosis not present

## 2022-07-11 ENCOUNTER — Telehealth: Payer: Self-pay | Admitting: *Deleted

## 2022-07-11 NOTE — Telephone Encounter (Signed)
   Pre-operative Risk Assessment    Patient Name: Sheena Craig  DOB: 21-Aug-1940 MRN: [id number]     Request for Surgical Clearance    Procedure:   DENTAL CLEANING, DENTAL X-RAYS AND DENTAL EXAM  Date of Surgery:  Clearance TBD (SOMETIME IN Counce)                               Surgeon:  DR. Rhodia Albright, DDS Surgeon's Group or Practice Name:  Rhodia Albright, DDS, [facility] Phone number:  [phone] Fax number:  [phone]   Type of Clearance Requested:   - Medical  - Pharmacy:  Hold Aspirin and Clopidogrel (Plavix)     Type of Anesthesia:  Local    Additional requests/questions:    Elpidio Anis   07/11/2022, 3:55 PM

## 2022-07-11 NOTE — Telephone Encounter (Signed)
   Patient Name: Sheena Craig  DOB: Nov 29, 1940 MRN: [id number]  Primary Cardiologist: Verne Carrow, MD  Chart reviewed as part of pre-operative protocol coverage.    Simple dental extractions (i.e. 1-2 teeth) are considered low risk procedures per guidelines and generally do not require any specific cardiac clearance. It is also generally accepted that for simple extractions and dental cleanings, there is no need to interrupt blood thinner therapy.    SBE prophylaxis is not required for the patient from a cardiac standpoint.  I will route this recommendation to the requesting party via Epic fax function and remove from pre-op pool.  Please call with questions.  Napoleon Form, Leodis Rains, NP 07/11/2022, 4:00 PM

## 2022-07-17 ENCOUNTER — Ambulatory Visit: Payer: Medicare Other | Admitting: Internal Medicine

## 2022-07-29 DIAGNOSIS — M1711 Unilateral primary osteoarthritis, right knee: Secondary | ICD-10-CM | POA: Diagnosis not present

## 2022-07-31 DIAGNOSIS — I214 Non-ST elevation (NSTEMI) myocardial infarction: Secondary | ICD-10-CM | POA: Diagnosis not present

## 2022-08-01 DIAGNOSIS — I214 Non-ST elevation (NSTEMI) myocardial infarction: Secondary | ICD-10-CM | POA: Diagnosis not present

## 2022-08-05 DIAGNOSIS — D649 Anemia, unspecified: Secondary | ICD-10-CM | POA: Diagnosis not present

## 2022-08-05 DIAGNOSIS — K219 Gastro-esophageal reflux disease without esophagitis: Secondary | ICD-10-CM | POA: Diagnosis not present

## 2022-08-05 DIAGNOSIS — K5900 Constipation, unspecified: Secondary | ICD-10-CM | POA: Diagnosis not present

## 2022-08-05 DIAGNOSIS — I214 Non-ST elevation (NSTEMI) myocardial infarction: Secondary | ICD-10-CM | POA: Diagnosis not present

## 2022-08-05 DIAGNOSIS — Z8379 Family history of other diseases of the digestive system: Secondary | ICD-10-CM | POA: Diagnosis not present

## 2022-08-07 DIAGNOSIS — I214 Non-ST elevation (NSTEMI) myocardial infarction: Secondary | ICD-10-CM | POA: Diagnosis not present

## 2022-08-08 DIAGNOSIS — I214 Non-ST elevation (NSTEMI) myocardial infarction: Secondary | ICD-10-CM | POA: Diagnosis not present

## 2022-08-12 DIAGNOSIS — I214 Non-ST elevation (NSTEMI) myocardial infarction: Secondary | ICD-10-CM | POA: Diagnosis not present

## 2022-08-14 DIAGNOSIS — I214 Non-ST elevation (NSTEMI) myocardial infarction: Secondary | ICD-10-CM | POA: Diagnosis not present

## 2022-08-15 DIAGNOSIS — I214 Non-ST elevation (NSTEMI) myocardial infarction: Secondary | ICD-10-CM | POA: Diagnosis not present

## 2022-08-16 ENCOUNTER — Encounter: Payer: Self-pay | Admitting: Family Medicine

## 2022-08-19 ENCOUNTER — Encounter: Payer: Self-pay | Admitting: Family Medicine

## 2022-08-19 DIAGNOSIS — I214 Non-ST elevation (NSTEMI) myocardial infarction: Secondary | ICD-10-CM | POA: Diagnosis not present

## 2022-08-20 ENCOUNTER — Ambulatory Visit (INDEPENDENT_AMBULATORY_CARE_PROVIDER_SITE_OTHER): Payer: Medicare Other | Admitting: Family Medicine

## 2022-08-20 ENCOUNTER — Encounter: Payer: Self-pay | Admitting: Family Medicine

## 2022-08-20 VITALS — BP 135/56 | HR 86 | Resp 16 | Ht 65.0 in | Wt 113.8 lb

## 2022-08-20 DIAGNOSIS — E538 Deficiency of other specified B group vitamins: Secondary | ICD-10-CM | POA: Diagnosis not present

## 2022-08-20 NOTE — Progress Notes (Signed)
Established patient visit   Patient: Sheena Craig   DOB: 11-20-1940   82 y.o. Female  MRN: [id number] Visit Date: 08/20/2022  Today's healthcare provider: Charlton Amor, DO   Chief Complaint  Patient presents with   Follow-up    SUBJECTIVE    Chief Complaint  Patient presents with   Follow-up   HPI  Pt presents for follow up on b12 deficiency. She wonders if she still needs b12 injections.  Saw Tommy Rainwater and did a cleanse blast. She is doing a quart of miralax and ducolax.  Saw nutritionist at cardiac rehab and did a shake with protein powder.   Review of Systems  Constitutional:  Negative for activity change, fatigue and fever.  Respiratory:  Negative for cough and shortness of breath.   Cardiovascular:  Negative for chest pain.  Gastrointestinal:  Negative for abdominal pain.  Genitourinary:  Negative for difficulty urinating.       Current Meds  Medication Sig   aspirin 81 MG chewable tablet Chew by mouth.   Calcium-Vitamin D-Vitamin K (CALCIUM SOFT CHEWS PO) Take 3 tablets by mouth daily.   clopidogrel (PLAVIX) 75 MG tablet Take 1 tablet by mouth daily.   cyanocobalamin (VITAMIN B12) 1000 MCG/ML injection INJECT 1 ML INTRAMUSCULARLY MONTHLY AS DIRECTED   dextromethorphan-guaiFENesin (MUCINEX DM) 30-600 MG per 12 hr tablet Take 1 tablet by mouth 2 (two) times daily as needed for cough.   docusate sodium (COLACE) 100 MG capsule Take 100 mg by mouth daily as needed for mild constipation.   FLUoxetine (PROZAC) 10 MG capsule Take 1 capsule by mouth daily.   FOLIC ACID PO Take 1 tablet by mouth 3 (three) times a week.   glycerin adult 2 g suppository Place 1 suppository rectally as needed for constipation.   levothyroxine (SYNTHROID) 100 MCG tablet Take 1 tablet (100 mcg total) by mouth daily.   linaclotide (LINZESS) 145 MCG CAPS capsule Take 1 capsule (145 mcg total) by mouth daily.   NONFORMULARY OR COMPOUNDED ITEM 1 tablet daily.   NONFORMULARY OR  COMPOUNDED ITEM 1 tablet daily.   rosuvastatin (CRESTOR) 20 MG tablet Take 20 mg by mouth daily.   SYRINGE-NEEDLE, DISP, 3 ML (B-D 3CC LUER-LOK SYR 25GX1") 25G X 1" 3 ML MISC USE FOR B12 SHOTS ONCE A MONTH    OBJECTIVE    BP (!) 135/56 (BP Location: Left Arm, Patient Position: Sitting, Cuff Size: Normal)   Pulse 86   Resp 16   Ht 5\' 5"  (1.651 m)   Wt 113 lb 12 oz (51.6 kg)   SpO2 100%   BMI 18.93 kg/m   Physical Exam Vitals and nursing note reviewed.  Constitutional:      General: She is not in acute distress.    Appearance: Normal appearance.  HENT:     Head: Normocephalic and atraumatic.     Right Ear: External ear normal.     Left Ear: External ear normal.     Nose: Nose normal.  Eyes:     Conjunctiva/sclera: Conjunctivae normal.  Cardiovascular:     Rate and Rhythm: Normal rate and regular rhythm.  Pulmonary:     Effort: Pulmonary effort is normal.     Breath sounds: Normal breath sounds.  Neurological:     General: No focal deficit present.     Mental Status: She is alert and oriented to person, place, and time.  Psychiatric:        Mood and Affect: Mood normal.  Behavior: Behavior normal.        Thought Content: Thought content normal.        Judgment: Judgment normal.          ASSESSMENT & PLAN    Problem List Items Addressed This Visit       Other   B12 deficiency - Primary    Patient presents for B12 blood work today.  She was previously diagnosed with B12 deficiency years ago after her and her husband got back from the [place] and was started on B12 injections.  She is curious if she still needs them.  We will go ahead and get blood work today.  Also talked about possibly doing a trial off B12 injections      Relevant Orders   Vitamin B12    No follow-ups on file.      No orders of the defined types were placed in this encounter.   Orders Placed This Encounter  Procedures   Vitamin B12     Charlton Amor, DO  [facility]  Primary Care & Sports Medicine at [facility] [phone] (phone) [phone] (fax)  [facility]

## 2022-08-20 NOTE — Assessment & Plan Note (Signed)
Patient presents for B12 blood work today.  She was previously diagnosed with B12 deficiency years ago after her and her husband got back from the [place] and was started on B12 injections.  She is curious if she still needs them.  We will go ahead and get blood work today.  Also talked about possibly doing a trial off B12 injections

## 2022-08-21 DIAGNOSIS — I214 Non-ST elevation (NSTEMI) myocardial infarction: Secondary | ICD-10-CM | POA: Diagnosis not present

## 2022-08-22 DIAGNOSIS — I214 Non-ST elevation (NSTEMI) myocardial infarction: Secondary | ICD-10-CM | POA: Diagnosis not present

## 2022-08-23 DIAGNOSIS — D649 Anemia, unspecified: Secondary | ICD-10-CM | POA: Diagnosis not present

## 2022-08-26 DIAGNOSIS — I214 Non-ST elevation (NSTEMI) myocardial infarction: Secondary | ICD-10-CM | POA: Diagnosis not present

## 2022-08-28 DIAGNOSIS — M546 Pain in thoracic spine: Secondary | ICD-10-CM | POA: Diagnosis not present

## 2022-08-28 DIAGNOSIS — R9431 Abnormal electrocardiogram [ECG] [EKG]: Secondary | ICD-10-CM | POA: Diagnosis not present

## 2022-08-28 DIAGNOSIS — S39012A Strain of muscle, fascia and tendon of lower back, initial encounter: Secondary | ICD-10-CM | POA: Diagnosis not present

## 2022-08-28 DIAGNOSIS — R079 Chest pain, unspecified: Secondary | ICD-10-CM | POA: Diagnosis not present

## 2022-08-28 DIAGNOSIS — I214 Non-ST elevation (NSTEMI) myocardial infarction: Secondary | ICD-10-CM | POA: Diagnosis not present

## 2022-08-28 DIAGNOSIS — E538 Deficiency of other specified B group vitamins: Secondary | ICD-10-CM | POA: Diagnosis not present

## 2022-08-28 DIAGNOSIS — I498 Other specified cardiac arrhythmias: Secondary | ICD-10-CM | POA: Diagnosis not present

## 2022-08-28 DIAGNOSIS — M545 Low back pain, unspecified: Secondary | ICD-10-CM | POA: Diagnosis not present

## 2022-08-28 DIAGNOSIS — I878 Other specified disorders of veins: Secondary | ICD-10-CM | POA: Diagnosis not present

## 2022-08-28 DIAGNOSIS — K5909 Other constipation: Secondary | ICD-10-CM | POA: Diagnosis not present

## 2022-08-28 DIAGNOSIS — M797 Fibromyalgia: Secondary | ICD-10-CM | POA: Diagnosis not present

## 2022-08-28 DIAGNOSIS — E039 Hypothyroidism, unspecified: Secondary | ICD-10-CM | POA: Diagnosis not present

## 2022-08-28 DIAGNOSIS — R0789 Other chest pain: Secondary | ICD-10-CM | POA: Diagnosis not present

## 2022-08-28 DIAGNOSIS — F329 Major depressive disorder, single episode, unspecified: Secondary | ICD-10-CM | POA: Diagnosis not present

## 2022-08-28 DIAGNOSIS — I2510 Atherosclerotic heart disease of native coronary artery without angina pectoris: Secondary | ICD-10-CM | POA: Diagnosis not present

## 2022-08-28 DIAGNOSIS — Z7982 Long term (current) use of aspirin: Secondary | ICD-10-CM | POA: Diagnosis not present

## 2022-08-28 DIAGNOSIS — K5900 Constipation, unspecified: Secondary | ICD-10-CM | POA: Diagnosis not present

## 2022-08-28 DIAGNOSIS — I10 Essential (primary) hypertension: Secondary | ICD-10-CM | POA: Diagnosis not present

## 2022-08-28 DIAGNOSIS — R29890 Loss of height: Secondary | ICD-10-CM | POA: Diagnosis not present

## 2022-08-28 DIAGNOSIS — Z7902 Long term (current) use of antithrombotics/antiplatelets: Secondary | ICD-10-CM | POA: Diagnosis not present

## 2022-08-28 DIAGNOSIS — I252 Old myocardial infarction: Secondary | ICD-10-CM | POA: Diagnosis not present

## 2022-08-28 DIAGNOSIS — R7989 Other specified abnormal findings of blood chemistry: Secondary | ICD-10-CM | POA: Diagnosis not present

## 2022-08-28 DIAGNOSIS — X58XXXA Exposure to other specified factors, initial encounter: Secondary | ICD-10-CM | POA: Diagnosis not present

## 2022-08-28 DIAGNOSIS — M25511 Pain in right shoulder: Secondary | ICD-10-CM | POA: Diagnosis not present

## 2022-08-29 DIAGNOSIS — I214 Non-ST elevation (NSTEMI) myocardial infarction: Secondary | ICD-10-CM | POA: Diagnosis not present

## 2022-08-31 ENCOUNTER — Encounter: Payer: Self-pay | Admitting: Cardiovascular Disease

## 2022-09-02 ENCOUNTER — Telehealth: Payer: Self-pay | Admitting: Cardiovascular Disease

## 2022-09-02 ENCOUNTER — Other Ambulatory Visit: Payer: Self-pay | Admitting: Family Medicine

## 2022-09-02 DIAGNOSIS — M542 Cervicalgia: Secondary | ICD-10-CM | POA: Diagnosis not present

## 2022-09-02 DIAGNOSIS — F4320 Adjustment disorder, unspecified: Secondary | ICD-10-CM | POA: Diagnosis not present

## 2022-09-02 DIAGNOSIS — E538 Deficiency of other specified B group vitamins: Secondary | ICD-10-CM

## 2022-09-02 DIAGNOSIS — S46811A Strain of other muscles, fascia and tendons at shoulder and upper arm level, right arm, initial encounter: Secondary | ICD-10-CM | POA: Diagnosis not present

## 2022-09-02 MED ORDER — BD LUER-LOK SYRINGE 25G X 1" 3 ML MISC
1 refills | Status: DC
Start: 2022-09-02 — End: 2023-06-13

## 2022-09-02 MED ORDER — METOPROLOL SUCCINATE ER 25 MG PO TB24: 25.0000 mg | ORAL_TABLET | Freq: Every day | ORAL | 3 refills | Status: DC

## 2022-09-02 MED ORDER — CYANOCOBALAMIN 1000 MCG/ML IJ SOLN: INTRAMUSCULAR | 3 refills | Status: DC

## 2022-09-02 NOTE — Telephone Encounter (Signed)
Pt c/o BP issue: STAT if pt c/o blurred vision, one-sided weakness or slurred speech  1. What are your last 5 BP readings?  09/02/22 8:07 am 140/89              6:36 am 152/88 pt states HR was irregular   6:20 am 157/84 HR was irregular   3:20 am 156/90 104  12:20 am 154/103  09/01/22 3:00 am 134/81   2. Are you having any other symptoms (ex. Dizziness, headache, blurred vision, passed out)? Patient states she did have some gas pressure during the night but she took a Gas-X, it took some time but the pressure went away.  3. What is your BP issue? Patient states she was recently in the hospital for shoulder pain and her BP was elevated when at the hospital.  She states in the past she was on metoprolol and was taken off of it because of her having low BP.  She states that at 6:20am the morning she did take one metoprolol.

## 2022-09-02 NOTE — Telephone Encounter (Signed)
Called patient.  See telephone note from today 09/02/22.

## 2022-09-02 NOTE — Telephone Encounter (Signed)
Spoke with patient.  She was in the ER w left shoulder pain found to be musculoskeletal.  She is seeing ortho today and has be placed on hold at cardiac rehab.  She will let us know when she is ready to return and we will let cardiac rehab know.  For elevated BP, irreg HR, she took Toprol XL 50 mg which she had at home.  She was just taken off her Toprol 25 mg for hypotension on 07/01/22.  She feels occas palpitations but they are not bothersome.  She is going to restart the Torpol XL 25 mg daily starting tomorrow and will monitor blood pressures.  2 nights in a row she's felt chest pressure.  Relieved w Gas-x.    Pt appreciative for call/assistance.

## 2022-09-10 ENCOUNTER — Ambulatory Visit: Payer: Medicare Other | Admitting: Gastroenterology

## 2022-09-10 DIAGNOSIS — K080 Exfoliation of teeth due to systemic causes: Secondary | ICD-10-CM | POA: Diagnosis not present

## 2022-09-11 NOTE — Therapy (Signed)
OUTPATIENT PHYSICAL THERAPY CERVICAL EVALUATION   Patient Name: Sheena Craig MRN: [id number] DOB:[birth year], 82 y.o., female Today's Date: 09/12/2022  END OF SESSION:  PT End of Session - 09/12/22 1052     Visit Number 1    Date for PT Re-Evaluation 10/24/22    Authorization Type Blue MCR    Progress Note Due on Visit 10    PT Start Time 0805    PT Stop Time 0848    PT Time Calculation (min) 43 min    Activity Tolerance Patient tolerated treatment well    Behavior During Therapy [facility] for tasks assessed/performed             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   Sarcoidosis 10/17/2006   Past Surgical History:  Procedure Laterality Date   CATARACT EXTRACTION     bilateral   COLONOSCOPY     EXCISION METACARPAL MASS Right 07/24/2015   Procedure: EXCISION OF RIGHT LONG FINGER  MASS;  Surgeon: Mack Hook, MD;  Location: [facility];  Service: Orthopedics;  Laterality: Right;   LYMPH NODE BIOPSY  1968   PELVIC FRACTURE SURGERY  03-04-12    [facility]   UPPER GASTROINTESTINAL ENDOSCOPY     Patient Active Problem List   Diagnosis Date Noted   Hospital discharge follow-up 06/25/2022   GAD (generalized anxiety disorder) 06/25/2022   Weight loss 04/29/2022   Constipation 08/30/2021   Tremor 08/30/2021   Lumbar radiculopathy, right 05/05/2021   Encounter for well adult exam with abnormal findings 08/29/2020   Vitamin D deficiency 08/29/2020   Abnormal urine odor 08/24/2020   B12 deficiency 02/26/2020   Palpitations 09/16/2019   HLD  (hyperlipidemia) 12/04/2018   Left groin pain 11/30/2015   Dysphagia 11/19/2015   Insect bites 11/19/2015   Sacral back pain 11/19/2015   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   Blepharitis of both upper and lower eyelid of left eye 04/21/2015   Blepharitis of both upper and lower eyelid of right eye 04/21/2015   Degenerative drusen of both eyes 04/21/2015   Keratoconjunctivitis sicca of both eyes not specified as Sjogren's 04/21/2015   Pseudophakia of both eyes 04/21/2015   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   Dizziness 11/11/2014   Ganglion cyst of flexor tendon sheath of finger of right hand 06/18/2014   Bronchiectasis without acute exacerbation (HCC) 06/30/2012   Cough 04/09/2012   Heart murmur 03/25/2012   Abnormal CT scan, chest 03/25/2012   Peripheral edema 03/25/2012   Liver lesion 03/25/2012   Pancreatic lesion 03/25/2012   Coronary artery calcification seen on CAT scan 03/25/2012   Degenerative disc disease, cervical 03/25/2012   Degenerative disc disease, lumbar 03/25/2012   Syncope 03/13/2012   Pain 03/04/2012   Respiratory insufficiency following shock, trauma, or surgery 03/04/2012   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   Hypothyroidism 10/21/2011   Preventative health care 10/15/2010   LUMBAR RADICULOPATHY, LEFT 10/06/2009   FIBROMYALGIA 02/09/2007   Osteoporosis 02/09/2007   Sarcoidosis 10/17/2006   Anemia, iron deficiency 10/17/2006   Depression 10/17/2006   MIGRAINE HEADACHE 10/17/2006   GERD 10/17/2006  OSTEOARTHRITIS 10/17/2006    PCP: Charlton Amor, DO   REFERRING PROVIDER: Case, [place], MD  REFERRING DIAG: M54.2 (ICD-10-CM) - Cervicalgia   THERAPY DIAG:  Cervicalgia  Cramp and spasm  Muscle weakness (generalized)  Acute pain of right shoulder  Rationale for Evaluation and Treatment: Rehabilitation  ONSET DATE: 08/26/22 class, 08/28/22 ED visit  SUBJECTIVE:                                                                                                                                                                                                          SUBJECTIVE STATEMENT: Pain starts in R shoulder blade and radiates down R shoulder with numbness,  had heart attck in June 6, so started cardiac rehab, went 13 times, last exercise on 12 of August said sit on edge of seat with theratubing doing UE exercises, whole time thinking not good to do this without back support, the whole next day I was doing things left handed, that night could not sleep, pain was excruciating, the next day went to hospital, then my heart started acting up too, so they focused on that instead.  They sent me to orthopedics who gave me a trigger point injection which helped a little.  She was given a muscle relaxor but it makes her drowzy, she even uses a walker at home for safety.  It still hurts, best position is leaning forward with arms supported, and my right hand feels weak.  Hand dominance: Right  PERTINENT HISTORY:  T11 compression fx, 2014 pelvic fx, sarcoidosis, Left lumbar radiculopathy, anemia, fibromyalgia , R RTC tear, Osteoporosis, NSTEMI  PAIN:  Are you having pain? Yes: NPRS scale: 3-5/10 Pain location: radiates from shoulder blade down R arm to hand.   Pain description: throbbing, aching, numb Aggravating factors: sitting without support, lifting > 1 lb,  Relieving factors: leaning forward arms supported, Voltaren.  PRECAUTIONS: Fall  RED FLAGS: None    WEIGHT BEARING RESTRICTIONS: No  FALLS:  Has patient fallen in last 6 months? No  LIVING ENVIRONMENT: Lives with: lives with their spouse Lives in: House/apartment  OCCUPATION: retired   PLOF: Independent  PATIENT GOALS: not have pain, return to previous norm- gardening, etc   NEXT MD VISIT: 10/18/2022  OBJECTIVE:   DIAGNOSTIC FINDINGS:  Not available for review, per MD notes X-rays of R shoulder from 08/28/2022 showed no evidence of fracture or  advanced degenerative change.   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    CERVICAL ROM:   Active ROM A/PROM (deg) eval  Flexion 42 tight  Extension 43  Right lateral flexion   Left lateral flexion   Right rotation 66 "zing"  Left rotation 68 tight   (Blank rows = not tested)  UPPER EXTREMITY ROM:  WNL for flexion and abduction, slightly limited R shoulder functional internal rotation due to history of RTC tear.    UPPER EXTREMITY MMT:  MMT Right eval Left eval  Shoulder flexion 5 5  Shoulder extension 5 5  Shoulder abduction 5 5  Shoulder internal rotation 5 5  Shoulder external rotation 5 5  Elbow flexion 5 5  Elbow extension 5 5  Wrist flexion 5 5  Wrist extension 5 5  Grip strength 7lb 27lb   (Blank rows = not tested)  CERVICAL SPECIAL TESTS:  NT  FUNCTIONAL TESTS:  NT  TODAY'S TREATMENT:                                                                                                                              DATE:   09/12/22 Self Care: Findings, reassurance, explanation of referral patterns for triggerpoints, anatomy of rotator cuff, treatment plan.    PATIENT EDUCATION:  Education details: findings, POC.  Person educated: Patient Education method: Explanation Education comprehension: verbalized understanding  HOME EXERCISE PROGRAM: TBD  ASSESSMENT:  CLINICAL IMPRESSION: Sheena Craig  is a 82 y.o. right hand dominant female who was seen today for physical therapy evaluation and treatment for cevicalgia.  She  reports severe pain in R shoulder pain occurring after exercise class in cardiac rehab on 08/26/22 resulting in ED visit on 08/28/22, however pain was not address during this visit, referred to orthopedist  felt symptoms were consistent with trapezius strain and gave her a trigger point injection and placed on muscle relaxors which has helped with pain, but continues to have symptoms including numbness and tingling down her R arm.  Examination resultis consistent with muscle strain.  Katherina Right Jungbluth demonstrates multiple palpable trigger points in R periscapular musculature and is a good candidate for trigger point dry needling along with manual therapy and therapeutic exercise to strengthening to decrease R shoulder blade pain, numbness and tingling, improve grip strength, and allow full return to prior level of function.      OBJECTIVE IMPAIRMENTS: decreased activity tolerance, decreased ROM, decreased strength, hypomobility, increased fascial restrictions, impaired perceived functional ability, increased muscle spasms, impaired sensation, impaired UE functional use, and pain.   ACTIVITY LIMITATIONS: carrying, lifting, bending, sitting, sleeping, reach over head, and caring for others  PARTICIPATION LIMITATIONS: meal prep, cleaning, laundry, driving, and yard work  PERSONAL FACTORS: 3+ comorbidities: T11 compression  fx, 2014 pelvic fx, sarcoidosis, Left lumbar radiculopathy, anemia, fibromyalgia , Osteoporosis, NSTEMI, R RTC tear  are also affecting patient's functional outcome.   REHAB POTENTIAL: Good  CLINICAL DECISION MAKING: Evolving/moderate complexity  EVALUATION COMPLEXITY: Moderate   GOALS: Goals reviewed with patient? Yes  SHORT TERM GOALS: Target date: 09/26/2022   Patient will be independent with initial HEP.  Baseline:  Goal status: INITIAL  LONG TERM GOALS: Target date: 10/24/2022   Patient will be independent with advanced/ongoing HEP to improve outcomes and carryover.   Baseline:  Goal status: INITIAL  2.  Patient will report 75% improvement in R shoulder blade pain to improve QOL.  Baseline:  Goal status: INITIAL  3.  Patient will demonstrate full pain free cervical ROM for safety with driving.  Baseline: see objective Goal status: INITIAL  4.  Patient will report at least 8 points improvement on NDI to demonstrate improved functional ability.  Baseline: 25/50 Goal status: INITIAL  5.  Patient will demonstrate 75% improvement in radicular symptoms.  Baseline: numbness/tingling R ulnar nerve/C8 distribution Goal status: INITIAL  6. Patient will demonstrate at least 20 lbs R grip strength   Baseline: ~ 7 lbs R Goal status: INITIAL   PLAN:  PT FREQUENCY: 2x/week  PT DURATION: 6 weeks  PLANNED INTERVENTIONS: Therapeutic exercises, Therapeutic activity, Neuromuscular re-education, Balance training, Gait training, Patient/Family education, Self Care, Joint mobilization, Dry Needling, Electrical stimulation, Spinal mobilization, Cryotherapy, Moist heat, Taping, Manual therapy, and Re-evaluation  PLAN FOR NEXT SESSION: TrDN to R L/S, UT, subscap, manual therapy, start periscapular strengthening as tolerated.    Jena Gauss, PT, DPT 09/12/2022, 1:07 PM

## 2022-09-12 ENCOUNTER — Ambulatory Visit: Payer: Medicare Other | Admitting: Physical Therapy

## 2022-09-12 ENCOUNTER — Encounter: Payer: Self-pay | Admitting: Physical Therapy

## 2022-09-12 DIAGNOSIS — M25511 Pain in right shoulder: Secondary | ICD-10-CM | POA: Diagnosis not present

## 2022-09-12 DIAGNOSIS — R252 Cramp and spasm: Secondary | ICD-10-CM | POA: Diagnosis not present

## 2022-09-12 DIAGNOSIS — M6281 Muscle weakness (generalized): Secondary | ICD-10-CM | POA: Diagnosis not present

## 2022-09-12 DIAGNOSIS — M542 Cervicalgia: Secondary | ICD-10-CM | POA: Insufficient documentation

## 2022-09-15 ENCOUNTER — Encounter: Payer: Self-pay | Admitting: Family Medicine

## 2022-09-17 ENCOUNTER — Encounter: Payer: Self-pay | Admitting: Physical Therapy

## 2022-09-17 ENCOUNTER — Ambulatory Visit: Payer: Medicare Other | Attending: Orthopedic Surgery | Admitting: Physical Therapy

## 2022-09-17 DIAGNOSIS — M25511 Pain in right shoulder: Secondary | ICD-10-CM | POA: Insufficient documentation

## 2022-09-17 DIAGNOSIS — R252 Cramp and spasm: Secondary | ICD-10-CM | POA: Diagnosis not present

## 2022-09-17 DIAGNOSIS — M6281 Muscle weakness (generalized): Secondary | ICD-10-CM | POA: Insufficient documentation

## 2022-09-17 DIAGNOSIS — M542 Cervicalgia: Secondary | ICD-10-CM | POA: Insufficient documentation

## 2022-09-17 MED ORDER — CLOPIDOGREL BISULFATE 75 MG PO TABS: 75.0000 mg | ORAL_TABLET | Freq: Every day | ORAL | 1 refills | Status: DC

## 2022-09-17 NOTE — Telephone Encounter (Signed)
Patient requesting rx rf of clopidgrel  Last written by historic provider Last OV 08/20/2022 Upcoming appt 10/30/22

## 2022-09-17 NOTE — Therapy (Signed)
OUTPATIENT PHYSICAL THERAPY TREATMENT   Patient Name: [name] MRN: [id number] DOB:03/05/1940, 82 y.o., female Today's Date: 09/17/2022  END OF SESSION:  PT End of Session - 09/17/22 1316     Visit Number 2    Date for PT Re-Evaluation 10/24/22    Authorization Type Blue MCR    Progress Note Due on Visit 10    PT Start Time 1316    PT Stop Time 1402    PT Time Calculation (min) 46 min    Activity Tolerance Patient tolerated treatment well    Behavior During Therapy [facility] for tasks assessed/performed              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   Sarcoidosis 10/17/2006   Past Surgical History:  Procedure Laterality Date   CATARACT EXTRACTION     bilateral   COLONOSCOPY     EXCISION METACARPAL MASS Right 07/24/2015   Procedure: EXCISION OF RIGHT LONG FINGER  MASS;  Surgeon: Mack Hook, MD;  Location: [facility];  Service: Orthopedics;  Laterality: Right;   LYMPH NODE BIOPSY  1968   PELVIC FRACTURE SURGERY  03-04-12    [facility]-surgery at [facility]   UPPER GASTROINTESTINAL ENDOSCOPY     Patient Active Problem List   Diagnosis Date Noted   Hospital discharge follow-up 06/25/2022   GAD (generalized anxiety disorder) 06/25/2022   Weight loss 04/29/2022   Constipation 08/30/2021   Tremor 08/30/2021   Lumbar radiculopathy, right 05/05/2021   Encounter for well adult exam with abnormal findings 08/29/2020   Vitamin D deficiency 08/29/2020   Abnormal urine odor 08/24/2020   B12 deficiency 02/26/2020   Palpitations 09/16/2019   HLD (hyperlipidemia)  12/04/2018   Left groin pain 11/30/2015   Dysphagia 11/19/2015   Insect bites 11/19/2015   Sacral back pain 11/19/2015   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   Blepharitis of both upper and lower eyelid of left eye 04/21/2015   Blepharitis of both upper and lower eyelid of right eye 04/21/2015   Degenerative drusen of both eyes 04/21/2015   Keratoconjunctivitis sicca of both eyes not specified as Sjogren's 04/21/2015   Pseudophakia of both eyes 04/21/2015   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   Dizziness 11/11/2014   Ganglion cyst of flexor tendon sheath of finger of right hand 06/18/2014   Bronchiectasis without acute exacerbation (HCC) 06/30/2012   Cough 04/09/2012   Heart murmur 03/25/2012   Abnormal CT scan, chest 03/25/2012   Peripheral edema 03/25/2012   Liver lesion 03/25/2012   Pancreatic lesion 03/25/2012   Coronary artery calcification seen on CAT scan 03/25/2012   Degenerative disc disease, cervical 03/25/2012   Degenerative disc disease, lumbar 03/25/2012   Syncope 03/13/2012   Pain 03/04/2012   Respiratory insufficiency following shock, trauma, or surgery 03/04/2012   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   Hypothyroidism 10/21/2011   Preventative health care 10/15/2010   LUMBAR RADICULOPATHY, LEFT 10/06/2009   FIBROMYALGIA 02/09/2007   Osteoporosis 02/09/2007   Sarcoidosis 10/17/2006   Anemia, iron deficiency 10/17/2006   Depression 10/17/2006   MIGRAINE HEADACHE 10/17/2006   GERD 10/17/2006  OSTEOARTHRITIS 10/17/2006    PCP: Charlton Amor, DO   REFERRING PROVIDER: Case, [place], MD  REFERRING DIAG: M54.2 (ICD-10-CM) - Cervicalgia   THERAPY DIAG:  Cervicalgia  Cramp and spasm  Muscle weakness (generalized)  Acute pain of right shoulder  Rationale for Evaluation and Treatment: Rehabilitation  ONSET DATE: 08/26/22 class, 08/28/22 ED visit  SUBJECTIVE:                                                                                                                                                                                                          SUBJECTIVE STATEMENT: Sheena Craig reports she is on a medical leave from her cardiac rehab for the current neck and shoulder pain trigger by use of the resistance bands utilized during cardiac rehab.  She reports she was able to get in the pool yesterday and move her arms around w/o increased pain.  EVAL:  Pain starts in R shoulder blade and radiates down R shoulder with numbness,  had heart attck in June 6, so started cardiac rehab, went 13 times, last exercise on 12 of August said sit on edge of seat with theratubing doing UE exercises, whole time thinking not good to do this without back support, the whole next day I was doing things left handed, that night could not sleep, pain was excruciating, the next day went to hospital, then my heart started acting up too, so they focused on that instead.  They sent me to orthopedics who gave me a trigger point injection which helped a little.  She was given a muscle relaxor but it makes her drowzy, she even uses a walker at home for safety.  It still hurts, best position is leaning forward with arms supported, and my right hand feels weak.  Hand dominance: Right  PERTINENT HISTORY:  T11 compression fx, 2014 pelvic fx, sarcoidosis, Left lumbar radiculopathy, anemia, fibromyalgia , R RTC tear, Osteoporosis, NSTEMI  PAIN:  Are you having pain? Yes: NPRS scale: 2-3/10 Pain location: radiates from shoulder blade down R arm to hand.   Pain description: throbbing, aching, numb Aggravating factors: sitting without support, lifting > 1 lb,  Relieving factors: leaning forward arms supported, Voltaren.  PRECAUTIONS: Fall  RED FLAGS: None    WEIGHT BEARING RESTRICTIONS: No  FALLS:  Has patient fallen in last 6 months? No  LIVING ENVIRONMENT: Lives with: lives with their spouse Lives in: House/apartment  OCCUPATION: retired   PLOF:  Independent  PATIENT GOALS: not have pain, return to previous norm- gardening, etc   NEXT MD VISIT: 10/18/2022  OBJECTIVE:   DIAGNOSTIC FINDINGS:  Not available for review, per MD notes X-rays of R shoulder from 08/28/2022 showed no evidence of fracture or advanced degenerative change.   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    CERVICAL ROM:   Active ROM A/PROM (deg) eval  Flexion 42 tight  Extension 43  Right lateral flexion   Left lateral flexion   Right rotation 66 "zing"  Left rotation 68 tight   (Blank rows = not tested)  UPPER EXTREMITY ROM:  WNL for flexion and abduction, slightly limited R shoulder functional internal rotation due to history of RTC tear.    UPPER EXTREMITY MMT:  MMT Right eval Left eval  Shoulder flexion 5 5  Shoulder extension 5 5  Shoulder abduction 5 5  Shoulder internal rotation 5 5  Shoulder external rotation 5 5  Elbow flexion 5 5  Elbow extension 5 5  Wrist flexion 5 5  Wrist extension 5 5  Grip strength 7lb 27lb   (Blank rows = not tested)  CERVICAL SPECIAL TESTS:  NT  FUNCTIONAL TESTS:  NT  TODAY'S TREATMENT:                                                                                                                              DATE:   09/17/22 THERAPEUTIC EXERCISE: to improve flexibility, strength and mobility.  Demonstration, verbal and tactile cues throughout for technique.  UBE - L1.0 x 6 min  Seated LS/rhomboids/middle trap stretch x 30" Seated gentle B UT stretch 2 x 30" Seated gentle B  LS stretch 2 x 30" Seated L shoulder posterior capsule stretch 2 x 30" - 1st rep met with hand on opposite shoulder, 2nd rep with straight arm pull across body - patient preferring the latter Seated thoracolumbar extension left pec stretch over back of chair 10 x 5"  MANUAL THERAPY: To promote normalized muscle tension, improved flexibility, and reduced pain. Skilled palpation and monitoring of soft tissue during DN Trigger Point Dry-Needling  Treatment instructions: Expect mild to moderate muscle soreness. S/S of pneumothorax if dry needled over a lung field, and to seek immediate medical attention should they occur. Patient verbalized understanding of these instructions and education. Patient Consent Given: Yes Education handout provided: Yes Muscles treated: B UT/LS, R subscapularis, teres group and lats Electrical stimulation performed: No Parameters: N/A Treatment response/outcome: Twitch Response Elicited and Palpable Increase in Muscle Length STM/DTM, manual TPR and pin & stretch to muscles addressed with DN   09/12/22 Self Care: Findings, reassurance, explanation of referral patterns for triggerpoints, anatomy of rotator cuff, treatment plan.  PATIENT EDUCATION:  Education details: initial HEP, role of DN, and DN rational, procedure, outcomes, potential side effects, and recommended post-treatment exercises/activity   Person educated: Patient Education method: Explanation, Demonstration, Verbal cues, and Handouts Education comprehension: verbalized understanding, returned demonstration, verbal cues required, and needs further education  HOME EXERCISE PROGRAM: Access Code: [email or web address] URL: [email or web address] Date: 09/17/2022 Prepared by: Glenetta Hew  Exercises - Standing Lower Cervical and Upper Thoracic Stretch  - 2 x daily - 7 x weekly - 3 reps - 30 sec hold - Seated Gentle Upper Trapezius Stretch  - 2 x daily - 7 x weekly - 3 reps - 30 sec hold - Gentle  Levator Scapulae Stretch (Mirrored)  - 2 x daily - 7 x weekly - 3 reps - 30 sec hold - Standing Shoulder Posterior Capsule Stretch (Mirrored)  - 2 x daily - 7 x weekly - 3 reps - 30 sec hold - Seated Thoracic Lumbar Extension with Pectoralis Stretch  - 2 x daily - 7 x weekly - 2 sets - 10 reps - 5 sec hold  Patient Education - Trigger Point Dry Needling   ASSESSMENT:  CLINICAL IMPRESSION: Sheena Craig" reports she was able to get in the pool and move her arms around without increased pain, however still experiences pain in R upper and posterior shoulder with many daily activities.  Increased muscle tension evident in B UT and LS as well as R periscapular muscles which appeared amenable to DN.  After explanation of DN rational, procedures, outcomes and potential side effects, including precautions with DN over the lung fields, patient verbalized consent to DN treatment in conjunction with manual STM/DTM and TPR to reduce ttp/muscle tension. Muscles treated as indicated above. DN produced normal response with good twitches elicited resulting in palpable reduction in pain/ttp and muscle tension. Pt educated to expect mild to moderate muscle soreness for up to 24-48 hrs and instructed to continue prescribed HEP and current activity level with pt verbalizing understanding of these instructions.  Session concluded with instruction in gentle stretching HEP to help facilitate improved carryover of increased flexibility and decrease muscle tension following DN - patient cautioned to avoid pushing stretches into painful ROM.  Chedva "Sheena Craig" will benefit from continued skilled PT to address abnormal muscle tension, impaired sensation and strength deficits deficits to improve mobility and activity tolerance with decreased pain interference.   OBJECTIVE IMPAIRMENTS: decreased activity tolerance, decreased ROM, decreased strength, hypomobility, increased fascial restrictions, impaired perceived functional ability,  increased muscle spasms, impaired sensation, impaired UE functional use, and pain.   ACTIVITY LIMITATIONS: carrying, lifting, bending, sitting, sleeping, reach over head, and caring for others  PARTICIPATION LIMITATIONS: meal prep, cleaning, laundry, driving, and yard work  PERSONAL FACTORS: 3+ comorbidities: T11 compression fx, 2014 pelvic fx, sarcoidosis, Left lumbar radiculopathy, anemia, fibromyalgia , Osteoporosis, NSTEMI, R RTC tear  are also affecting patient's functional outcome.   REHAB POTENTIAL: Good  CLINICAL DECISION MAKING: Evolving/moderate complexity  EVALUATION COMPLEXITY: Moderate   GOALS: Goals reviewed with patient? Yes  SHORT TERM GOALS: Target date: 09/26/2022   Patient will be independent with initial HEP.  Baseline:  Goal status: IN PROGRESS  09/17/22 - initial HEP provided today  LONG TERM GOALS: Target date: 10/24/2022   Patient will be independent with advanced/ongoing HEP to improve outcomes and carryover.  Baseline:  Goal status: IN PROGRESS  2.  Patient will report 75% improvement in R shoulder blade pain to improve QOL.  Baseline:  Goal status: IN PROGRESS  3.  Patient will demonstrate full pain free cervical ROM for safety with driving.  Baseline: see objective Goal status: IN PROGRESS  4.  Patient will report at least 8 points improvement on NDI to demonstrate improved functional ability.  Baseline: 25/50 Goal status: IN PROGRESS  5.  Patient will demonstrate 75% improvement in radicular symptoms.  Baseline: numbness/tingling R ulnar nerve/C8 distribution Goal status: IN PROGRESS  6. Patient will demonstrate at least 20 lbs R grip strength   Baseline: ~ 7 lbs R Goal status: IN PROGRESS   PLAN:  PT FREQUENCY: 2x/week  PT DURATION: 6 weeks  PLANNED INTERVENTIONS: Therapeutic exercises, Therapeutic activity, Neuromuscular re-education, Balance training, Gait training, Patient/Family education, Self Care, Joint mobilization, Dry  Needling, Electrical stimulation, Spinal mobilization, Cryotherapy, Moist heat, Taping, Manual therapy, and Re-evaluation  PLAN FOR NEXT SESSION: Assess response to DN; TrDN to R L/S, UT, subscap, manual therapy, start periscapular strengthening as tolerated.    Marry Guan, PT 09/17/2022, 6:24 PM

## 2022-09-19 ENCOUNTER — Encounter: Payer: Self-pay | Admitting: Physical Therapy

## 2022-09-19 ENCOUNTER — Ambulatory Visit: Payer: Medicare Other | Admitting: Physical Therapy

## 2022-09-19 DIAGNOSIS — R252 Cramp and spasm: Secondary | ICD-10-CM

## 2022-09-19 DIAGNOSIS — M542 Cervicalgia: Secondary | ICD-10-CM

## 2022-09-19 DIAGNOSIS — M25511 Pain in right shoulder: Secondary | ICD-10-CM | POA: Diagnosis not present

## 2022-09-19 DIAGNOSIS — M6281 Muscle weakness (generalized): Secondary | ICD-10-CM

## 2022-09-19 NOTE — Therapy (Signed)
OUTPATIENT PHYSICAL THERAPY TREATMENT   Patient Name: Sheena Craig MRN: [id number] DOB:April 24, 1940, 82 y.o., female Today's Date: 09/19/2022  END OF SESSION:  PT End of Session - 09/19/22 0849     Visit Number 3    Date for PT Re-Evaluation 10/24/22    Authorization Type Blue MCR    Progress Note Due on Visit 10    PT Start Time 0849    PT Stop Time 0931    PT Time Calculation (min) 42 min    Activity Tolerance Patient tolerated treatment well    Behavior During Therapy [facility] for tasks assessed/performed              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   Sarcoidosis 10/17/2006   Past Surgical History:  Procedure Laterality Date   CATARACT EXTRACTION     bilateral   COLONOSCOPY     EXCISION METACARPAL MASS Right 07/24/2015   Procedure: EXCISION OF RIGHT LONG FINGER  MASS;  Surgeon: Mack Hook, MD;  Location: [facility];  Service: Orthopedics;  Laterality: Right;   LYMPH NODE BIOPSY  1968   PELVIC FRACTURE SURGERY  03-04-12    [facility]-surgery at [facility]   UPPER GASTROINTESTINAL ENDOSCOPY     Patient Active Problem List   Diagnosis Date Noted   Hospital discharge follow-up 06/25/2022   GAD (generalized anxiety disorder) 06/25/2022   Weight loss 04/29/2022   Constipation 08/30/2021   Tremor 08/30/2021   Lumbar radiculopathy, right 05/05/2021   Encounter for well adult exam with abnormal findings 08/29/2020   Vitamin D deficiency 08/29/2020   Abnormal urine odor 08/24/2020   B12 deficiency 02/26/2020   Palpitations 09/16/2019   HLD (hyperlipidemia)  12/04/2018   Left groin pain 11/30/2015   Dysphagia 11/19/2015   Insect bites 11/19/2015   Sacral back pain 11/19/2015   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   Blepharitis of both upper and lower eyelid of left eye 04/21/2015   Blepharitis of both upper and lower eyelid of right eye 04/21/2015   Degenerative drusen of both eyes 04/21/2015   Keratoconjunctivitis sicca of both eyes not specified as Sjogren's 04/21/2015   Pseudophakia of both eyes 04/21/2015   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   Dizziness 11/11/2014   Ganglion cyst of flexor tendon sheath of finger of right hand 06/18/2014   Bronchiectasis without acute exacerbation (HCC) 06/30/2012   Cough 04/09/2012   Heart murmur 03/25/2012   Abnormal CT scan, chest 03/25/2012   Peripheral edema 03/25/2012   Liver lesion 03/25/2012   Pancreatic lesion 03/25/2012   Coronary artery calcification seen on CAT scan 03/25/2012   Degenerative disc disease, cervical 03/25/2012   Degenerative disc disease, lumbar 03/25/2012   Syncope 03/13/2012   Pain 03/04/2012   Respiratory insufficiency following shock, trauma, or surgery 03/04/2012   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   Hypothyroidism 10/21/2011   Preventative health care 10/15/2010   LUMBAR RADICULOPATHY, LEFT 10/06/2009   FIBROMYALGIA 02/09/2007   Osteoporosis 02/09/2007   Sarcoidosis 10/17/2006   Anemia, iron deficiency 10/17/2006   Depression 10/17/2006   MIGRAINE HEADACHE 10/17/2006   GERD 10/17/2006  OSTEOARTHRITIS 10/17/2006    PCP: Charlton Amor, DO  REFERRING PROVIDER: Case, [place], MD  REFERRING DIAG: M54.2 (ICD-10-CM) - Cervicalgia   THERAPY DIAG:  Cervicalgia  Cramp and spasm  Muscle weakness (generalized)  Acute pain of right shoulder  Rationale for Evaluation and Treatment: Rehabilitation  ONSET DATE: 08/26/22 class, 08/28/22 ED visit  SUBJECTIVE:                                                                                                                                                                                                          SUBJECTIVE STATEMENT: Pt reports a little bit of soreness following the DN.  She slept a lot and felt like a wet noodle yesterday so she did not et to do the HEP as much.  No muscle relaxant in her system and just 1 tylenol this morning.  EVAL:  Pain starts in R shoulder blade and radiates down R shoulder with numbness,  had heart attck in June 6, so started cardiac rehab, went 13 times, last exercise on 12 of August said sit on edge of seat with theratubing doing UE exercises, whole time thinking not good to do this without back support, the whole next day I was doing things left handed, that night could not sleep, pain was excruciating, the next day went to hospital, then my heart started acting up too, so they focused on that instead.  They sent me to orthopedics who gave me a trigger point injection which helped a little.  She was given a muscle relaxor but it makes her drowzy, she even uses a walker at home for safety.  It still hurts, best position is leaning forward with arms supported, and my right hand feels weak.  Hand dominance: Right  PERTINENT HISTORY:  T11 compression fx, 2014 pelvic fx, sarcoidosis, Left lumbar radiculopathy, anemia, fibromyalgia , R RTC tear, Osteoporosis, NSTEMI  PAIN:  Are you having pain? Yes: NPRS scale: 2-3/10 Pain location: radiates from shoulder blade down R arm to hand.   Pain description: throbbing, aching, numb Aggravating factors: sitting without support, lifting > 1 lb,  Relieving factors: leaning forward arms supported, Voltaren.  PRECAUTIONS: Fall  RED FLAGS: None    WEIGHT BEARING RESTRICTIONS: No  FALLS:  Has patient fallen in last 6 months? No  LIVING ENVIRONMENT: Lives with: lives with their spouse Lives in: House/apartment  OCCUPATION: retired   PLOF: Independent  PATIENT GOALS: not have pain, return to  previous norm- gardening, etc   NEXT MD VISIT: 10/18/2022  OBJECTIVE:   DIAGNOSTIC  FINDINGS:  Not available for review, per MD notes X-rays of R shoulder from 08/28/2022 showed no evidence of fracture or advanced degenerative change.   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    CERVICAL ROM:   Active ROM A/PROM (deg) eval  Flexion 42 tight  Extension 43  Right lateral flexion   Left lateral flexion   Right rotation 66 "zing"  Left rotation 68 tight   (Blank rows = not tested)  UPPER EXTREMITY ROM:  WNL for flexion and abduction, slightly limited R shoulder functional internal rotation due to history of RTC tear.    UPPER EXTREMITY MMT:  MMT Right eval Left eval  Shoulder flexion 5 5  Shoulder extension 5 5  Shoulder abduction 5 5  Shoulder internal rotation 5 5  Shoulder external rotation 5 5  Elbow flexion 5 5  Elbow extension 5 5  Wrist flexion 5 5  Wrist extension 5 5  Grip strength 7lb 27lb   (Blank rows = not tested)  CERVICAL SPECIAL TESTS:  NT  FUNCTIONAL TESTS:  NT  TODAY'S TREATMENT:                                                                                                                              DATE:   09/19/22 THERAPEUTIC EXERCISE: to improve flexibility, strength and mobility.  Demonstration, verbal and tactile cues throughout for technique.  UBE - L1.5 x 6 min (3' each fwd & back) Seated L shoulder posterior capsule stretch with straight arm pull across body 2 x 30"  Seated thoracolumbar extension left pec  stretch over back of chair 10 x 5" - cues for neutral C-spine, avoiding cervical extension as pt noting it made her feel like she would pass out at home Seated gentle B UT stretch x 30" - clarification of head position Seated gentle B LS stretch x 30" Seated RTB scap retraction + shoulder row 10 x 3" Seated RTB scap retraction + shoulder extension 10 x 3" R hand yellow Digi-Flex:  Full grip x 10 Isolated finger flexion x 10 Yellow putty: Gross grip Isolated finger flexion Pinch grip Pinch & pull  Twisting   09/17/22 THERAPEUTIC EXERCISE: to improve flexibility, strength and mobility.  Demonstration, verbal and tactile cues throughout for technique.  UBE - L1.0 x 6 min  Seated LS/rhomboids/middle trap stretch x 30" Seated gentle B UT stretch 2 x 30" Seated gentle B LS stretch 2 x 30" Seated L shoulder posterior capsule stretch 2 x 30" - 1st rep with hand on opposite shoulder, 2nd rep with straight arm pull across body - patient  preferring the latter Seated thoracolumbar extension left pec stretch over back of chair 10 x 5"  MANUAL THERAPY: To promote normalized muscle tension, improved flexibility, and reduced pain. Skilled palpation and monitoring of soft tissue during DN Trigger Point Dry-Needling  Treatment instructions: Expect mild to moderate muscle soreness. S/S of pneumothorax if dry needled over a lung field, and to seek immediate medical attention should they occur. Patient verbalized understanding of these instructions and education. Patient Consent Given: Yes Education handout provided: Yes Muscles treated: B UT/LS, R subscapularis, teres group and lats Electrical stimulation performed: No Parameters: N/A Treatment response/outcome: Twitch Response Elicited and Palpable Increase in Muscle Length STM/DTM, manual TPR and pin & stretch to muscles addressed with DN   09/12/22 Self Care: Findings, reassurance, explanation of referral patterns for triggerpoints, anatomy of  rotator cuff, treatment plan.     PATIENT EDUCATION:  Education details: HEP review and HEP update - scapular and grip strengthening    Person educated: Patient Education method: Explanation, Demonstration, Verbal cues, and Handouts Education comprehension: verbalized understanding, returned demonstration, verbal cues required, and needs further education  HOME EXERCISE PROGRAM: Access Code: [email or web address] URL: [email or web address] Date: 09/19/2022 Prepared by: Glenetta Hew  Exercises - Standing Lower Cervical and Upper Thoracic Stretch  - 2 x daily - 7 x weekly - 3 reps - 30 sec hold - Seated Gentle Upper Trapezius Stretch  - 2 x daily - 7 x weekly - 3 reps - 30 sec hold - Gentle Levator Scapulae Stretch (Mirrored)  - 2 x daily - 7 x weekly - 3 reps - 30 sec hold - Standing Shoulder Posterior Capsule Stretch (Mirrored)  - 2 x daily - 7 x weekly - 3 reps - 30 sec hold - Seated Thoracic Lumbar Extension with Pectoralis Stretch  - 2 x daily - 7 x weekly - 2 sets - 10 reps - 5 sec hold - Seated Shoulder Row with Anchored Resistance  - 1 x daily - 3-4 x weekly - 2 sets - 10 reps - 3-5 hold hold - Seated Shoulder Extension and Scapular Retraction with Resistance  - 1 x daily - 3-4 x weekly - 2 sets - 10 reps - 3-5 sec hold  Patient Education - Trigger Point Dry Needling   ASSESSMENT:  CLINICAL IMPRESSION: Sheena Craig" reports some benefit from DN last session but reports she felt more sleepy and loose "like a wet noodle" yesterday so she did not take the muscle relaxant this morning. Reviewed and provided clarification of a few of the HEP exercises, with pt verbalizing better understanding following review. Initiated periscapular strengthening with neutral shoulder as well as R grip strengthening today with good tolerance, therefore added to HEP. Pt noting continued numbness in ulnar nerve distribution which may benefit from trial of nerve glides/flossing in upcoming visits.  Sheena Craig "Sheena Craig" will benefit from continued skilled PT to address abnormal muscle tension, impaired sensation and strength deficits deficits to improve mobility and activity tolerance with decreased pain interference.   OBJECTIVE IMPAIRMENTS: decreased activity tolerance, decreased ROM, decreased strength, hypomobility, increased fascial restrictions, impaired perceived functional ability, increased muscle spasms, impaired sensation, impaired UE functional use, and pain.   ACTIVITY LIMITATIONS: carrying, lifting, bending, sitting, sleeping, reach over head, and caring for others  PARTICIPATION LIMITATIONS: meal prep, cleaning, laundry, driving, and yard work  PERSONAL FACTORS: 3+ comorbidities: T11 compression fx, 2014 pelvic fx, sarcoidosis, Left lumbar radiculopathy, anemia, fibromyalgia , Osteoporosis, NSTEMI, R RTC tear  are also affecting patient's functional outcome.  REHAB POTENTIAL: Good  CLINICAL DECISION MAKING: Evolving/moderate complexity  EVALUATION COMPLEXITY: Moderate   GOALS: Goals reviewed with patient? Yes  SHORT TERM GOALS: Target date: 09/26/2022   Patient will be independent with initial HEP.  Baseline:  Goal status: IN PROGRESS  09/19/22 - reviewed and updated today  LONG TERM GOALS: Target date: 10/24/2022   Patient will be independent with advanced/ongoing HEP to improve outcomes and carryover.  Baseline:  Goal status: IN PROGRESS  2.  Patient will report 75% improvement in R shoulder blade pain to improve QOL.  Baseline:  Goal status: IN PROGRESS  3.  Patient will demonstrate full pain free cervical ROM for safety with driving.  Baseline: see objective Goal status: IN PROGRESS  4.  Patient will report at least 8 points improvement on NDI to demonstrate improved functional ability.  Baseline: 25/50 Goal status: IN PROGRESS  5.  Patient will demonstrate 75% improvement in radicular symptoms.  Baseline: numbness/tingling R ulnar nerve/C8  distribution Goal status: IN PROGRESS  6. Patient will demonstrate at least 20 lbs R grip strength   Baseline: ~ 7 lbs R Goal status: IN PROGRESS   PLAN:  PT FREQUENCY: 2x/week  PT DURATION: 6 weeks  PLANNED INTERVENTIONS: Therapeutic exercises, Therapeutic activity, Neuromuscular re-education, Balance training, Gait training, Patient/Family education, Self Care, Joint mobilization, Dry Needling, Electrical stimulation, Spinal mobilization, Cryotherapy, Moist heat, Taping, Manual therapy, and Re-evaluation  PLAN FOR NEXT SESSION: possible ulnar nerve gliding/flossing; TrDN to R L/S, UT, subscap PRN; manual therapy; progress periscapular and grip strengthening as tolerated.    Marry Guan, PT 09/19/2022, 9:46 AM

## 2022-09-20 DIAGNOSIS — R1310 Dysphagia, unspecified: Secondary | ICD-10-CM | POA: Diagnosis not present

## 2022-09-22 ENCOUNTER — Encounter: Payer: Self-pay | Admitting: Family Medicine

## 2022-09-23 ENCOUNTER — Ambulatory Visit: Payer: Medicare Other | Admitting: Physical Therapy

## 2022-09-23 ENCOUNTER — Other Ambulatory Visit: Payer: Self-pay | Admitting: Family Medicine

## 2022-09-23 ENCOUNTER — Encounter: Payer: Self-pay | Admitting: Physical Therapy

## 2022-09-23 DIAGNOSIS — R252 Cramp and spasm: Secondary | ICD-10-CM

## 2022-09-23 DIAGNOSIS — M25511 Pain in right shoulder: Secondary | ICD-10-CM | POA: Diagnosis not present

## 2022-09-23 DIAGNOSIS — M6281 Muscle weakness (generalized): Secondary | ICD-10-CM | POA: Diagnosis not present

## 2022-09-23 DIAGNOSIS — M542 Cervicalgia: Secondary | ICD-10-CM

## 2022-09-23 MED ORDER — ROSUVASTATIN CALCIUM 20 MG PO TABS: 20.0000 mg | ORAL_TABLET | Freq: Every day | ORAL | 2 refills | Status: DC
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
## 2022-09-26 ENCOUNTER — Ambulatory Visit: Payer: Medicare Other | Admitting: Physical Therapy

## 2022-09-26 ENCOUNTER — Encounter: Payer: Self-pay | Admitting: Physical Therapy

## 2022-09-26 DIAGNOSIS — M25511 Pain in right shoulder: Secondary | ICD-10-CM

## 2022-09-26 DIAGNOSIS — R252 Cramp and spasm: Secondary | ICD-10-CM

## 2022-09-26 DIAGNOSIS — M542 Cervicalgia: Secondary | ICD-10-CM | POA: Diagnosis not present

## 2022-09-26 DIAGNOSIS — M6281 Muscle weakness (generalized): Secondary | ICD-10-CM | POA: Diagnosis not present

## 2022-09-26 NOTE — Therapy (Signed)
OUTPATIENT PHYSICAL THERAPY TREATMENT   Patient Name: Sheena Craig MRN: [id number] DOB:May 13, 1940, 82 y.o.,, female Today's Date: 09/26/2022  END OF SESSION:  PT End of Session - 09/26/22 0936     Visit Number 5    Date for PT Re-Evaluation 10/24/22    Authorization Type Blue MCR    Progress Note Due on Visit 10    PT Start Time 0934    PT Stop Time 1017    PT Time Calculation (min) 43 min    Activity Tolerance Patient tolerated treatment well    Behavior During Therapy [facility] for tasks assessed/performed              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   Sarcoidosis 10/17/2006   Past Surgical History:  Procedure Laterality Date   CATARACT EXTRACTION     bilateral   COLONOSCOPY     EXCISION METACARPAL MASS Right 07/24/2015   Procedure: EXCISION OF RIGHT LONG FINGER  MASS;  Surgeon: Mack Hook, MD;  Location: [facility];  Service: Orthopedics;  Laterality: Right;   LYMPH NODE BIOPSY  1968   PELVIC FRACTURE SURGERY  03-04-12    [facility]surgery at [facility]   UPPER GASTROINTESTINAL ENDOSCOPY     Patient Active Problem List   Diagnosis Date Noted   Hospital discharge follow-up 06/25/2022   GAD (generalized anxiety disorder) 06/25/2022   Weight loss 04/29/2022   Constipation 08/30/2021   Tremor 08/30/2021   Lumbar radiculopathy, right 05/05/2021   Encounter for well adult exam with abnormal findings 08/29/2020   Vitamin D deficiency 08/29/2020   Abnormal urine odor 08/24/2020   B12 deficiency 02/26/2020   Palpitations 09/16/2019   HLD (hyperlipidemia)  12/04/2018   Left groin pain 11/30/2015   Dysphagia 11/19/2015   Insect bites 11/19/2015   Sacral back pain 11/19/2015   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   Blepharitis of both upper and lower eyelid of left eye 04/21/2015   Blepharitis of both upper and lower eyelid of right eye 04/21/2015   Degenerative drusen of both eyes 04/21/2015   Keratoconjunctivitis sicca of both eyes not specified as Sjogren's 04/21/2015   Pseudophakia of both eyes 04/21/2015   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   Dizziness 11/11/2014   Ganglion cyst of flexor tendon sheath of finger of right hand 06/18/2014   Bronchiectasis without acute exacerbation (HCC) 06/30/2012   Cough 04/09/2012   Heart murmur 03/25/2012   Abnormal CT scan, chest 03/25/2012   Peripheral edema 03/25/2012   Liver lesion 03/25/2012   Pancreatic lesion 03/25/2012   Coronary artery calcification seen on CAT scan 03/25/2012   Degenerative disc disease, cervical 03/25/2012   Degenerative disc disease, lumbar 03/25/2012   Syncope 03/13/2012   Pain 03/04/2012   Respiratory insufficiency following shock, trauma, or surgery 03/04/2012   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   Hypothyroidism 10/21/2011   Preventative health care 10/15/2010   LUMBAR RADICULOPATHY, LEFT 10/06/2009   FIBROMYALGIA 02/09/2007   Osteoporosis 02/09/2007   Sarcoidosis 10/17/2006   Anemia, iron deficiency 10/17/2006   Depression 10/17/2006   MIGRAINE HEADACHE 10/17/2006   GERD 10/17/2006  OSTEOARTHRITIS 10/17/2006    PCP: Charlton Amor, DO  REFERRING PROVIDER: Case, [place], MD  REFERRING DIAG: M54.2 (ICD-10-CM) - Cervicalgia   THERAPY DIAG:  Cervicalgia  Cramp and spasm  Muscle weakness (generalized)  Acute pain of right shoulder  Rationale for Evaluation and Treatment: Rehabilitation  ONSET DATE: 08/26/22 class, 08/28/22 ED visit  SUBJECTIVE:                                                                                                                                                                                                          SUBJECTIVE STATEMENT: No pain, little achiness.  Doing her exercises daily.    EVAL:  Pain starts in R shoulder blade and radiates down R shoulder with numbness,  had heart attck in June 6, so started cardiac rehab, went 13 times, last exercise on 12 of August said sit on edge of seat with theratubing doing UE exercises, whole time thinking not good to do this without back support, the whole next day I was doing things left handed, that night could not sleep, pain was excruciating, the next day went to hospital, then my heart started acting up too, so they focused on that instead.  They sent me to orthopedics who gave me a trigger point injection which helped a little.  She was given a muscle relaxor but it makes her drowzy, she even uses a walker at home for safety.  It still hurts, best position is leaning forward with arms supported, and my right hand feels weak.  Hand dominance: Right  PERTINENT HISTORY:  T11 compression fx, 2014 pelvic fx, sarcoidosis, Left lumbar radiculopathy, anemia, fibromyalgia , R RTC tear, Osteoporosis, NSTEMI, constipation  PAIN:  Are you having pain? Yes: NPRS scale: 0/10 Pain location: radiates from shoulder blade down R arm to hand.   Pain description: throbbing, aching, numb Aggravating factors: sitting without support, lifting > 1 lb,  Relieving factors: leaning forward arms supported, Voltaren.  PRECAUTIONS: Fall  RED FLAGS: None    WEIGHT BEARING RESTRICTIONS: No  FALLS:  Has patient fallen in last 6 months? No  LIVING ENVIRONMENT: Lives with: lives with their spouse Lives in: House/apartment  OCCUPATION: retired   PLOF: Independent  PATIENT GOALS: not have pain, return to previous norm- gardening, etc   NEXT MD VISIT: 10/18/2022  OBJECTIVE:   DIAGNOSTIC FINDINGS:  Not available for review, per MD notes X-rays of R  shoulder from 08/28/2022 showed no evidence of fracture or advanced degenerative change.   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    CERVICAL ROM:   Active ROM A/PROM (deg) eval  Flexion 42 tight  Extension 43  Right lateral flexion   Left lateral flexion   Right rotation 66 "zing"  Left rotation 68 tight   (Blank rows = not tested)  UPPER EXTREMITY ROM:  WNL for flexion and abduction, slightly limited R shoulder functional internal rotation due to history of RTC tear.    UPPER EXTREMITY MMT:  MMT Right eval Left eval  Shoulder flexion 5 5  Shoulder extension 5 5  Shoulder abduction 5 5  Shoulder internal rotation 5 5  Shoulder external rotation 5 5  Elbow flexion 5 5  Elbow extension 5 5  Wrist flexion 5 5  Wrist extension 5 5  Grip strength 7lb 27lb   (Blank rows = not tested)  CERVICAL SPECIAL TESTS:  NT  FUNCTIONAL TESTS:  NT  TODAY'S TREATMENT:                                                                                                                              DATE:   09/26/22 Therapeutic Exercise: to improve strength and mobility.  Demo, verbal and tactile cues throughout for technique. Nustep L5 x 6 min  Reviewed HEP - corrected shoulder rolls to retro Decompression exercises for osteoporesis a la Sara Meeks: Chin tucks in supine Shoulder press- cues for submax to avoid back spasms Leg extensions Leg press - cues for submax to avoid low back spasms Education and demo for self massage for lower gi for constipation.  Manual  Therapy: to decrease muscle spasm and pain and improve mobility STM/TPR to cervical paraspinals, R UT, levators scapulae, PA mobs to cervical spine grade 2-3, NAGs into rotation.   09/23/22 Therapeutic Exercise: to improve strength and mobility.  Demo, verbal and tactile cues throughout for technique. Nustep L4 x 5 min - reported some R shoulder pain towards end Seated scapular retraction RTB x 5 - started having pain Isometric scapular retraction - tolerated better Seated chin tucks x 10  In supine - chin tucks, shoulder press - cues not to press too hard, patient arched back and set off spasm  Review of current HEP - timing, reps and dosage.   Manual Therapy: to decrease muscle spasm and pain and improve mobility STM/TPR to cervical paraspinals, R UT, levators scapulae, PA mobs to cervical spine grade 2-3, NAGs into rotation.   09/19/22 THERAPEUTIC EXERCISE: to improve flexibility, strength and mobility.  Demonstration, verbal and tactile cues throughout for technique.  UBE - L1.5 x 6 min (3' each fwd & back) Seated L shoulder posterior capsule  stretch with straight arm pull across body 2 x 30"  Seated thoracolumbar extension left pec stretch over back of chair 10 x 5" - cues for neutral C-spine, avoiding cervical extension as pt noting it made her feel like she would pass out at home Seated gentle B UT stretch x 30" - clarification of head position Seated gentle B LS stretch x 30" Seated RTB scap retraction + shoulder row 10 x 3" Seated RTB scap retraction + shoulder extension 10 x 3" R hand yellow Digi-Flex:  Full grip x 10 Isolated finger flexion x 10 Yellow putty: Gross grip Isolated finger flexion Pinch grip Pinch & pull  Twisting    PATIENT EDUCATION:  Education details: HEP review and HEP update - scapular and grip strengthening    Person educated: Patient Education method: Programmer, multimedia, Demonstration, Verbal cues, and Handouts Education comprehension: verbalized  understanding, returned demonstration, verbal cues required, and needs further education  HOME EXERCISE PROGRAM: Access Code: [email or web address] URL: [email or web address] Date: 09/23/2022 Prepared by: Harrie Foreman  Exercises - Standing Lower Cervical and Upper Thoracic Stretch  - 2 x daily - 7 x weekly - 3 reps - 30 sec hold - Seated Gentle Upper Trapezius Stretch  - 2 x daily - 7 x weekly - 3 reps - 30 sec hold - Gentle Levator Scapulae Stretch (Mirrored)  - 2 x daily - 7 x weekly - 3 reps - 30 sec hold - Standing Shoulder Posterior Capsule Stretch (Mirrored)  - 2 x daily - 7 x weekly - 3 reps - 30 sec hold - Seated Thoracic Lumbar Extension with Pectoralis Stretch  - 2 x daily - 7 x weekly - 2 sets - 10 reps - 5 sec hold - Seated Shoulder Row with Anchored Resistance  - 1 x daily - 3-4 x weekly - 2 sets - 10 reps - 3-5 hold hold - Seated Shoulder Extension and Scapular Retraction with Resistance  - 1 x daily - 3-4 x weekly - 2 sets - 10 reps - 3-5 sec hold - Seated Cervical Retraction  - 1 x daily - 7 x weekly - 1-2 sets - 10 reps - Supine Chin Tuck on Pillow  - 1 x daily - 7 x weekly - 1 sets - 10 reps - Seated Scapular Retraction  - 1 x daily - 7 x weekly - 1-2 sets - 10 reps - 5 sec hold - Supine Shoulder Press  - 1 x daily - 7 x weekly - 1 sets - 10 reps - 5 sec  hold  Patient Education - Trigger Point Dry Needling   ASSESSMENT:  CLINICAL IMPRESSION: Ameriya Sheffler" reports no pain today.  Reviewed HEP, cues needed to avoid performing isometric exercise to point of spasm.  Also educated on GI massage and correct direction.  Still noted increased tension in R UT/LS and rhomboids but improving.  Kinzly "[name]" will benefit from continued skilled PT to address abnormal muscle tension, impaired sensation and strength deficits deficits to improve mobility and activity tolerance with decreased pain interference.   OBJECTIVE IMPAIRMENTS: decreased activity tolerance, decreased  ROM, decreased strength, hypomobility, increased fascial restrictions, impaired perceived functional ability, increased muscle spasms, impaired sensation, impaired UE functional use, and pain.   ACTIVITY LIMITATIONS: carrying, lifting, bending, sitting, sleeping, reach over head, and caring for others  PARTICIPATION LIMITATIONS: meal prep, cleaning, laundry, driving, and yard work  PERSONAL FACTORS: 3+ comorbidities: T11 compression fx, 2014 pelvic fx, sarcoidosis, Left lumbar radiculopathy, anemia, fibromyalgia , Osteoporosis, NSTEMI, R RTC  tear  are also affecting patient's functional outcome.   REHAB POTENTIAL: Good  CLINICAL DECISION MAKING: Evolving/moderate complexity  EVALUATION COMPLEXITY: Moderate   GOALS: Goals reviewed with patient? Yes  SHORT TERM GOALS: Target date: 09/26/2022   Patient will be independent with initial HEP.  Baseline:  Goal status: IN PROGRESS  09/19/22 - reviewed and updated today  LONG TERM GOALS: Target date: 10/24/2022   Patient will be independent with advanced/ongoing HEP to improve outcomes and carryover.  Baseline:  Goal status: IN PROGRESS 09/26/22- good compliance, cues needed  2.  Patient will report 75% improvement in R shoulder blade pain to improve QOL.  Baseline:  Goal status: IN PROGRESS 09/26/22- no pain reported  3.  Patient will demonstrate full pain free cervical ROM for safety with driving.  Baseline: see objective Goal status: IN PROGRESS  4.  Patient will report at least 8 points improvement on NDI to demonstrate improved functional ability.  Baseline: 25/50 Goal status: IN PROGRESS  5.  Patient will demonstrate 75% improvement in radicular symptoms.  Baseline: numbness/tingling R ulnar nerve/C8 distribution Goal status: IN PROGRESS  6. Patient will demonstrate at least 20 lbs R grip strength   Baseline: ~ 7 lbs R Goal status: IN PROGRESS   PLAN:  PT FREQUENCY: 2x/week  PT DURATION: 6 weeks  PLANNED INTERVENTIONS:  Therapeutic exercises, Therapeutic activity, Neuromuscular re-education, Balance training, Gait training, Patient/Family education, Self Care, Joint mobilization, Dry Needling, Electrical stimulation, Spinal mobilization, Cryotherapy, Moist heat, Taping, Manual therapy, and Re-evaluation  PLAN FOR NEXT SESSION: possible ulnar nerve gliding/flossing; TrDN to R L/S, UT, subscap PRN; manual therapy; progress periscapular and grip strengthening as tolerated.    Jena Gauss, PT, DPT 09/26/2022, 2:23 PM

## 2022-09-30 ENCOUNTER — Encounter: Payer: Self-pay | Admitting: Physical Therapy

## 2022-09-30 ENCOUNTER — Ambulatory Visit: Payer: Medicare Other | Admitting: Physical Therapy

## 2022-09-30 DIAGNOSIS — R252 Cramp and spasm: Secondary | ICD-10-CM

## 2022-09-30 DIAGNOSIS — M6281 Muscle weakness (generalized): Secondary | ICD-10-CM | POA: Diagnosis not present

## 2022-09-30 DIAGNOSIS — M25511 Pain in right shoulder: Secondary | ICD-10-CM

## 2022-09-30 DIAGNOSIS — M542 Cervicalgia: Secondary | ICD-10-CM
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
## 2022-10-03 ENCOUNTER — Ambulatory Visit: Payer: Medicare Other | Admitting: Physical Therapy

## 2022-10-03 ENCOUNTER — Encounter: Payer: Self-pay | Admitting: Physical Therapy

## 2022-10-03 DIAGNOSIS — M6281 Muscle weakness (generalized): Secondary | ICD-10-CM | POA: Diagnosis not present

## 2022-10-03 DIAGNOSIS — M542 Cervicalgia: Secondary | ICD-10-CM

## 2022-10-03 DIAGNOSIS — R252 Cramp and spasm: Secondary | ICD-10-CM

## 2022-10-03 DIAGNOSIS — M25511 Pain in right shoulder: Secondary | ICD-10-CM

## 2022-10-03 NOTE — Therapy (Signed)
OUTPATIENT PHYSICAL THERAPY TREATMENT   Patient Name: Sheena Craig MRN: [id number] DOB:04-24-1940, 82 y.o., female Today's Date: 10/03/2022  END OF SESSION:  PT End of Session - 10/03/22 0937     Visit Number 7    Date for PT Re-Evaluation 10/24/22    Authorization Type Blue MCR    Progress Note Due on Visit 10    PT Start Time 0935    PT Stop Time 1015    PT Time Calculation (min) 40 min    Activity Tolerance Patient tolerated treatment well    Behavior During Therapy [facility] for tasks assessed/performed              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   Sarcoidosis 10/17/2006   Past Surgical History:  Procedure Laterality Date   CATARACT EXTRACTION     bilateral   COLONOSCOPY     EXCISION METACARPAL MASS Right 07/24/2015   Procedure: EXCISION OF RIGHT LONG FINGER  MASS;  Surgeon: Mack Hook, MD;  Location: [facility];  Service: Orthopedics;  Laterality: Right;   LYMPH NODE BIOPSY  1968   PELVIC FRACTURE SURGERY  03-04-12    [facility]-surgery at [facility]   UPPER GASTROINTESTINAL ENDOSCOPY     Patient Active Problem List   Diagnosis Date Noted   Hospital discharge follow-up 06/25/2022   GAD (generalized anxiety disorder) 06/25/2022   Weight loss 04/29/2022   Constipation 08/30/2021   Tremor 08/30/2021   Lumbar radiculopathy, right 05/05/2021   Encounter for well adult exam with abnormal findings 08/29/2020   Vitamin D deficiency 08/29/2020   Abnormal urine odor 08/24/2020   B12 deficiency 02/26/2020   Palpitations 09/16/2019   HLD (hyperlipidemia)  12/04/2018   Left groin pain 11/30/2015   Dysphagia 11/19/2015   Insect bites 11/19/2015   Sacral back pain 11/19/2015   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   Blepharitis of both upper and lower eyelid of left eye 04/21/2015   Blepharitis of both upper and lower eyelid of right eye 04/21/2015   Degenerative drusen of both eyes 04/21/2015   Keratoconjunctivitis sicca of both eyes not specified as Sjogren's 04/21/2015   Pseudophakia of both eyes 04/21/2015   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   Dizziness 11/11/2014   Ganglion cyst of flexor tendon sheath of finger of right hand 06/18/2014   Bronchiectasis without acute exacerbation (HCC) 06/30/2012   Cough 04/09/2012   Heart murmur 03/25/2012   Abnormal CT scan, chest 03/25/2012   Peripheral edema 03/25/2012   Liver lesion 03/25/2012   Pancreatic lesion 03/25/2012   Coronary artery calcification seen on CAT scan 03/25/2012   Degenerative disc disease, cervical 03/25/2012   Degenerative disc disease, lumbar 03/25/2012   Syncope 03/13/2012   Pain 03/04/2012   Respiratory insufficiency following shock, trauma, or surgery 03/04/2012   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   Hypothyroidism 10/21/2011   Preventative health care 10/15/2010   LUMBAR RADICULOPATHY, LEFT 10/06/2009   FIBROMYALGIA 02/09/2007   Osteoporosis 02/09/2007   Sarcoidosis 10/17/2006   Anemia, iron deficiency 10/17/2006   Depression 10/17/2006   MIGRAINE HEADACHE 10/17/2006   GERD 10/17/2006  OSTEOARTHRITIS 10/17/2006    PCP: Charlton Amor, DO  REFERRING PROVIDER: Case, [place], MD  REFERRING DIAG: M54.2 (ICD-10-CM) - Cervicalgia   THERAPY DIAG:  Cervicalgia  Cramp and spasm  Muscle weakness (generalized)  Acute pain of right shoulder  Rationale for Evaluation and Treatment: Rehabilitation  ONSET DATE: 08/26/22 class, 08/28/22 ED visit  SUBJECTIVE:                                                                                                                                                                                                          SUBJECTIVE STATEMENT: Doing well, wasn't sore after DN last session, noticing L side of neck bothering her more than Right side.     EVAL:  Pain starts in R shoulder blade and radiates down R shoulder with numbness,  had heart attck in June 6, so started cardiac rehab, went 13 times, last exercise on 12 of August said sit on edge of seat with theratubing doing UE exercises, whole time thinking not good to do this without back support, the whole next day I was doing things left handed, that night could not sleep, pain was excruciating, the next day went to hospital, then my heart started acting up too, so they focused on that instead.  They sent me to orthopedics who gave me a trigger point injection which helped a little.  She was given a muscle relaxor but it makes her drowzy, she even uses a walker at home for safety.  It still hurts, best position is leaning forward with arms supported, and my right hand feels weak.  Hand dominance: Right  PERTINENT HISTORY:  T11 compression fx, 2014 pelvic fx, sarcoidosis, Left lumbar radiculopathy, anemia, fibromyalgia , R RTC tear, Osteoporosis, NSTEMI, constipation  PAIN:  Are you having pain? Yes: NPRS scale: 0/10 Pain location: radiates from shoulder blade down R arm to hand.   Pain description: throbbing, aching, numb Aggravating factors: sitting without support, lifting > 1 lb,  Relieving factors: leaning forward arms supported, Voltaren.  PRECAUTIONS: Fall  RED FLAGS: None    WEIGHT BEARING RESTRICTIONS: No  FALLS:  Has patient fallen in last 6 months? No  LIVING ENVIRONMENT: Lives with: lives with their spouse Lives in: House/apartment  OCCUPATION: retired   PLOF: Independent  PATIENT GOALS: not have pain, return to previous norm- gardening, etc   NEXT MD VISIT: 10/18/2022  OBJECTIVE:   DIAGNOSTIC FINDINGS:   Not available for review, per MD notes X-rays of R shoulder from 08/28/2022 showed no evidence of fracture or advanced degenerative  change.   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    CERVICAL ROM:   Active ROM A/PROM (deg) eval  Flexion 42 tight  Extension 43  Right lateral flexion   Left lateral flexion   Right rotation 66 "zing"  Left rotation 68 tight   (Blank rows = not tested)  UPPER EXTREMITY ROM:  WNL for flexion and abduction, slightly limited R shoulder functional internal rotation due to history of RTC tear.    UPPER EXTREMITY MMT:  MMT Right eval Left eval  Shoulder flexion 5 5  Shoulder extension 5 5  Shoulder abduction 5 5  Shoulder internal rotation 5 5  Shoulder external rotation 5 5  Elbow flexion 5 5  Elbow extension 5 5  Wrist flexion 5 5  Wrist extension 5 5  Grip strength 7lb 27lb   (Blank rows = not tested)  CERVICAL SPECIAL TESTS:  NT  FUNCTIONAL TESTS:  NT  TODAY'S TREATMENT:                                                                                                                              DATE:   10/03/22 Therapeutic Exercise: to improve strength and mobility.  Demo, verbal and tactile cues throughout for technique. UBE x 6 min 22f/3b Standing Shoulder extensions RTB x 20 Standing rows RTB x 20 Standing shoulder ER x 10  Levator stretch - progressed by adding opposite hand for overpressure on head and sitting on same side hand to increased stretch, could feel stretch much more.  Manual Therapy: to  decrease muscle spasm and pain and improve mobility STM/TPR to cervical paraspinals, bil UT and levators, skilled palpation and monitoring during dry needling. Trigger Point Dry-Needling  Treatment instructions: Expect mild to moderate muscle soreness. S/S of pneumothorax if dry needled over a lung field, and to seek immediate medical attention should they occur. Patient verbalized understanding of these instructions and education. Patient Consent Given: Yes Education handout provided: Previously provided Muscles treated: bil cervical multifid C4-6, bil UT and levators Electrical stimulation performed: No Parameters: N/A Treatment response/outcome: Twitch Response Elicited and Palpable Increase in Muscle Length    09/30/22 Therapeutic Exercise: to improve strength and mobility.  Demo, verbal and tactile cues throughout for technique. UBE L1.0 x 6 min (61f/3b) Standing Shoulder extensions RTB x 20 Standing rows RTB x 20 Standing shoulder ER x 10  - noted increased spasm Seated ulnar nerve glides x 20 Manual Therapy: to decrease muscle spasm and pain and improve mobility STM/TPR to R forearm extensors, lats, subscap, delts; radial/ulnar distal and proximal mobs; skilled palpation and monitoring  during dry needling. Trigger Point Dry-Needling  Treatment instructions: Expect mild to moderate muscle soreness. S/S of pneumothorax if dry needled over a lung field, and to seek immediate medical attention should they occur. Patient verbalized understanding of these instructions and education. Patient Consent Given: Yes Education handout provided: Previously provided Muscles treated: R forearm extensor group, R latissimus and subscap, R lateral deltoid Electrical stimulation performed: No Parameters: N/A Treatment response/outcome: Twitch Response Elicited and Palpable Increase in Muscle Length  09/26/22 Therapeutic Exercise: to improve strength and mobility.  Demo, verbal and tactile cues throughout  for technique. Nustep L5 x 6 min  Reviewed HEP - corrected shoulder rolls to retro Decompression exercises for osteoporesis a la Sara Meeks: Chin tucks in supine Shoulder press- cues for submax to avoid back spasms Leg extensions Leg press - cues for submax to avoid low back spasms Education and demo for self massage for lower gi for constipation.  Manual Therapy: to decrease muscle spasm and pain and improve mobility STM/TPR to cervical paraspinals, R UT, levators scapulae, PA mobs to cervical spine grade 2-3, NAGs into rotation.     PATIENT EDUCATION:  Education details: HEP review   Person educated: Patient Education method: Medical illustrator Education comprehension: verbalized understanding and returned demonstration  HOME EXERCISE PROGRAM: Access Code: [email or web address] URL: [email or web address] Date: 09/30/2022 Prepared by: Harrie Foreman  Exercises - Standing Lower Cervical and Upper Thoracic Stretch  - 2 x daily - 7 x weekly - 3 reps - 30 sec hold - Seated Gentle Upper Trapezius Stretch  - 2 x daily - 7 x weekly - 3 reps - 30 sec hold - Gentle Levator Scapulae Stretch (Mirrored)  - 2 x daily - 7 x weekly - 3 reps - 30 sec hold - Standing Shoulder Posterior Capsule Stretch (Mirrored)  - 2 x daily - 7 x weekly - 3 reps - 30 sec hold - Seated Thoracic Lumbar Extension with Pectoralis Stretch  - 2 x daily - 7 x weekly - 2 sets - 10 reps - 5 sec hold - Seated Shoulder Row with Anchored Resistance  - 1 x daily - 3-4 x weekly - 2 sets - 10 reps - 3-5 hold hold - Seated Shoulder Extension and Scapular Retraction with Resistance  - 1 x daily - 3-4 x weekly - 2 sets - 10 reps - 3-5 sec hold - Seated Cervical Retraction  - 1 x daily - 7 x weekly - 1-2 sets - 10 reps - Supine Chin Tuck on Pillow  - 1 x daily - 7 x weekly - 1 sets - 10 reps - Seated Scapular Retraction  - 1 x daily - 7 x weekly - 1-2 sets - 10 reps - 5 sec hold - Shoulder External Rotation and  Scapular Retraction with Resistance  - 1 x daily - 7 x weekly - 3 sets - 10 reps  Patient Education - Trigger Point Dry Needling   ASSESSMENT:  CLINICAL IMPRESSION: Reviewed HEP today, corrected levator stretch as she wasn't feeling much stretch, then focused on manual therapy to this region as still had palpable trigger points on both sides in levator scapulae.  Reported decreased tightness after interventions.   Mica "[name]" will benefit from continued skilled PT to address abnormal muscle tension, impaired sensation and strength deficits deficits to improve mobility and activity tolerance with decreased pain interference.   OBJECTIVE IMPAIRMENTS: decreased activity tolerance, decreased ROM, decreased strength, hypomobility, increased fascial restrictions, impaired perceived functional ability, increased muscle spasms, impaired  sensation, impaired UE functional use, and pain.   ACTIVITY LIMITATIONS: carrying, lifting, bending, sitting, sleeping, reach over head, and caring for others  PARTICIPATION LIMITATIONS: meal prep, cleaning, laundry, driving, and yard work  PERSONAL FACTORS: 3+ comorbidities: T11 compression fx, 2014 pelvic fx, sarcoidosis, Left lumbar radiculopathy, anemia, fibromyalgia , Osteoporosis, NSTEMI, R RTC tear  are also affecting patient's functional outcome.   REHAB POTENTIAL: Good  CLINICAL DECISION MAKING: Evolving/moderate complexity  EVALUATION COMPLEXITY: Moderate   GOALS: Goals reviewed with patient? Yes  SHORT TERM GOALS: Target date: 09/26/2022   Patient will be independent with initial HEP.  Baseline:  Goal status: IN PROGRESS  09/19/22 - reviewed and updated today  LONG TERM GOALS: Target date: 10/24/2022   Patient will be independent with advanced/ongoing HEP to improve outcomes and carryover.  Baseline:  Goal status: IN PROGRESS 09/26/22- good compliance, cues needed  2.  Patient will report 75% improvement in R shoulder blade pain to improve  QOL.  Baseline:  Goal status: IN PROGRESS 09/26/22- no pain reported  3.  Patient will demonstrate full pain free cervical ROM for safety with driving.  Baseline: see objective Goal status: IN PROGRESS  4.  Patient will report at least 8 points improvement on NDI to demonstrate improved functional ability.  Baseline: 25/50 Goal status: IN PROGRESS  5.  Patient will demonstrate 75% improvement in radicular symptoms.  Baseline: numbness/tingling R ulnar nerve/C8 distribution Goal status: IN PROGRESS  6. Patient will demonstrate at least 20 lbs R grip strength   Baseline: ~ 7 lbs R Goal status: IN PROGRESS   PLAN:  PT FREQUENCY: 2x/week  PT DURATION: 6 weeks  PLANNED INTERVENTIONS: Therapeutic exercises, Therapeutic activity, Neuromuscular re-education, Balance training, Gait training, Patient/Family education, Self Care, Joint mobilization, Dry Needling, Electrical stimulation, Spinal mobilization, Cryotherapy, Moist heat, Taping, Manual therapy, and Re-evaluation  PLAN FOR NEXT SESSION: possible ulnar nerve gliding/flossing; TrDN to R L/S, UT, subscap PRN; manual therapy; progress periscapular and grip strengthening as tolerated.    Jena Gauss, PT, DPT 10/03/2022, 11:28 AM

## 2022-10-07 ENCOUNTER — Encounter: Payer: Medicare Other | Admitting: Physical Therapy

## 2022-10-10 ENCOUNTER — Encounter: Payer: Self-pay | Admitting: Physical Therapy

## 2022-10-10 ENCOUNTER — Ambulatory Visit: Payer: Medicare Other | Admitting: Physical Therapy

## 2022-10-10 VITALS — BP 90/50

## 2022-10-10 DIAGNOSIS — M25511 Pain in right shoulder: Secondary | ICD-10-CM | POA: Diagnosis not present

## 2022-10-10 DIAGNOSIS — M6281 Muscle weakness (generalized): Secondary | ICD-10-CM | POA: Diagnosis not present

## 2022-10-10 DIAGNOSIS — M542 Cervicalgia: Secondary | ICD-10-CM

## 2022-10-10 DIAGNOSIS — R252 Cramp and spasm: Secondary | ICD-10-CM | POA: Diagnosis not present
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
## 2022-10-11 ENCOUNTER — Ambulatory Visit: Payer: Medicare Other

## 2022-10-14 ENCOUNTER — Ambulatory Visit: Payer: Medicare Other | Admitting: Physical Therapy

## 2022-10-14 ENCOUNTER — Encounter: Payer: Self-pay | Admitting: Physical Therapy

## 2022-10-14 DIAGNOSIS — M6281 Muscle weakness (generalized): Secondary | ICD-10-CM | POA: Diagnosis not present

## 2022-10-14 DIAGNOSIS — R252 Cramp and spasm: Secondary | ICD-10-CM

## 2022-10-14 DIAGNOSIS — M542 Cervicalgia: Secondary | ICD-10-CM

## 2022-10-14 DIAGNOSIS — M25511 Pain in right shoulder: Secondary | ICD-10-CM | POA: Diagnosis not present

## 2022-10-14 NOTE — Therapy (Signed)
OUTPATIENT PHYSICAL THERAPY TREATMENT   Patient Name: Sheena Craig MRN: [id number] DOB:[birth year], 82 y.o., female Today's Date: 10/14/2022  END OF SESSION:  PT End of Session - 10/14/22 0934     Visit Number 9    Date for PT Re-Evaluation 10/24/22    Authorization Type Blue MCR    Progress Note Due on Visit 10    PT Start Time 0932    PT Stop Time 1016    PT Time Calculation (min) 44 min    Activity Tolerance Patient tolerated treatment well    Behavior During Therapy [facility] for tasks assessed/performed              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   Sarcoidosis 10/17/2006   Past Surgical History:  Procedure Laterality Date   CATARACT EXTRACTION     bilateral   COLONOSCOPY     EXCISION METACARPAL MASS Right 07/24/2015   Procedure: EXCISION OF RIGHT LONG FINGER  MASS;  Surgeon: Mack Hook, MD;  Location: [facility];  Service: Orthopedics;  Laterality: Right;   LYMPH NODE BIOPSY  1968   PELVIC FRACTURE SURGERY  03-04-12    [facility]surgery at [facility]   UPPER GASTROINTESTINAL ENDOSCOPY     Patient Active Problem List   Diagnosis Date Noted   Hospital discharge follow-up 06/25/2022   GAD (generalized anxiety disorder) 06/25/2022   Weight loss 04/29/2022   Constipation 08/30/2021   Tremor 08/30/2021   Lumbar radiculopathy, right 05/05/2021   Encounter for well adult exam with abnormal findings 08/29/2020   Vitamin D deficiency 08/29/2020   Abnormal urine odor 08/24/2020   B12 deficiency 02/26/2020   Palpitations 09/16/2019   HLD (hyperlipidemia)  12/04/2018   Left groin pain 11/30/2015   Dysphagia 11/19/2015   Insect bites 11/19/2015   Sacral back pain 11/19/2015   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   Blepharitis of both upper and lower eyelid of left eye 04/21/2015   Blepharitis of both upper and lower eyelid of right eye 04/21/2015   Degenerative drusen of both eyes 04/21/2015   Keratoconjunctivitis sicca of both eyes not specified as Sjogren's 04/21/2015   Pseudophakia of both eyes 04/21/2015   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   Dizziness 11/11/2014   Ganglion cyst of flexor tendon sheath of finger of right hand 06/18/2014   Bronchiectasis without acute exacerbation (HCC) 06/30/2012   Cough 04/09/2012   Heart murmur 03/25/2012   Abnormal CT scan, chest 03/25/2012   Peripheral edema 03/25/2012   Liver lesion 03/25/2012   Pancreatic lesion 03/25/2012   Coronary artery calcification seen on CAT scan 03/25/2012   Degenerative disc disease, cervical 03/25/2012   Degenerative disc disease, lumbar 03/25/2012   Syncope 03/13/2012   Pain 03/04/2012   Respiratory insufficiency following shock, trauma, or surgery 03/04/2012   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   Hypothyroidism 10/21/2011   Preventative health care 10/15/2010   LUMBAR RADICULOPATHY, LEFT 10/06/2009   FIBROMYALGIA 02/09/2007   Osteoporosis 02/09/2007   Sarcoidosis 10/17/2006   Anemia, iron deficiency 10/17/2006   Depression 10/17/2006   MIGRAINE HEADACHE 10/17/2006   GERD 10/17/2006  OSTEOARTHRITIS 10/17/2006    PCP: Charlton Amor, DO  REFERRING PROVIDER: Case, [place], MD  REFERRING DIAG: M54.2 (ICD-10-CM) - Cervicalgia   THERAPY DIAG:  Cervicalgia  Cramp and spasm  Muscle weakness (generalized)  Rationale for Evaluation and Treatment: Rehabilitation  ONSET DATE: 08/26/22 class, 08/28/22 ED visit  SUBJECTIVE:                                                                                                                                                                                                          SUBJECTIVE STATEMENT: Still has numbness in forearm but no pain.   EVAL:  Pain starts in R shoulder blade and radiates down R shoulder with numbness,  had heart attck in June 6, so started cardiac rehab, went 13 times, last exercise on 12 of August said sit on edge of seat with theratubing doing UE exercises, whole time thinking not good to do this without back support, the whole next day I was doing things left handed, that night could not sleep, pain was excruciating, the next day went to hospital, then my heart started acting up too, so they focused on that instead.  They sent me to orthopedics who gave me a trigger point injection which helped a little.  She was given a muscle relaxor but it makes her drowzy, she even uses a walker at home for safety.  It still hurts, best position is leaning forward with arms supported, and my right hand feels weak.  Hand dominance: Right  PERTINENT HISTORY:  T11 compression fx, 2014 pelvic fx, sarcoidosis, Left lumbar radiculopathy, anemia, fibromyalgia , R RTC tear, Osteoporosis, NSTEMI, constipation  PAIN:  Are you having pain? Yes: NPRS scale: 0/10 Pain location: radiates from shoulder blade down R arm to hand.   Pain description: throbbing, aching, numb Aggravating factors: sitting without support, lifting > 1 lb,  Relieving factors: leaning forward arms supported, Voltaren.  PRECAUTIONS: Fall  RED FLAGS: None    WEIGHT BEARING RESTRICTIONS: No  FALLS:  Has patient fallen in last 6 months? No  LIVING ENVIRONMENT: Lives with: lives with their spouse Lives in: House/apartment  OCCUPATION: retired   PLOF: Independent  PATIENT GOALS: not have pain, return to previous norm- gardening, etc   NEXT MD VISIT: 10/18/2022  OBJECTIVE:   DIAGNOSTIC FINDINGS:  Not available for review, per MD notes X-rays of R shoulder from 08/28/2022 showed no evidence of  fracture or advanced degenerative change.   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    CERVICAL ROM:   Active ROM A/PROM (deg) eval  Flexion 42 tight  Extension 43  Right lateral flexion   Left lateral flexion   Right rotation 66 "zing"  Left rotation 68 tight   (Blank rows = not tested)  UPPER EXTREMITY ROM:  WNL for flexion and abduction, slightly limited R shoulder functional internal rotation due to history of RTC tear.    UPPER EXTREMITY MMT:  MMT Right eval Left eval  Shoulder flexion 5 5  Shoulder extension 5 5  Shoulder abduction 5 5  Shoulder internal rotation 5 5  Shoulder external rotation 5 5  Elbow flexion 5 5  Elbow extension 5 5  Wrist flexion 5 5  Wrist extension 5 5  Grip strength 7lb 27lb   (Blank rows = not tested)  CERVICAL SPECIAL TESTS:  NT  FUNCTIONAL TESTS:  NT  TODAY'S TREATMENT:                                                                                                                              DATE:   10/14/22 Therapeutic Exercise: to improve strength and mobility.  Demo, verbal and tactile cues throughout for technique. UBE x (3f/2b) Review of HEP, added median nerve floss (tray to HEP) and printed. Manual Therapy: to decrease muscle spasm and pain and improve mobility Mobs to R elbow and wrist, IASTM to R pectoralis, arm and forearm, skilled palpation and monitoring during dry needling. Trigger Point Dry-Needling  Treatment instructions: Expect mild to moderate muscle soreness. S/S of pneumothorax  if dry needled over a lung field, and to seek immediate medical attention should they occur. Patient verbalized understanding of these instructions and education. Patient Consent Given: Yes Education handout provided: Previously provided Muscles treated: R pectoralis Electrical stimulation performed: No Parameters: N/A Treatment response/outcome: Twitch Response Elicited and Palpable Increase in Muscle Length   10/10/22 Therapeutic Exercise: to improve strength and mobility.  Demo, verbal and tactile cues throughout for technique. UBE L1 x 6 min (55f/3b) Review of HEP Ulnar nerve floss Therapeutic Activity:  vitals assessed.  BP 90/50 Manual Therapy: to decrease muscle spasm and pain and improve mobility STM/TPR to cervical paraspinals, R UT, R L/S   10/03/22 Therapeutic Exercise: to improve strength and mobility.  Demo, verbal and tactile cues throughout for technique. UBE x 6 min 34f/3b Standing Shoulder extensions RTB x 20 Standing rows RTB x 20 Standing shoulder ER x 10  Levator stretch - progressed by adding opposite hand for overpressure on head and sitting on same side hand to increased stretch, could feel stretch much more.  Manual Therapy: to decrease muscle spasm and pain and  improve mobility STM/TPR to cervical paraspinals, bil UT and levators, skilled palpation and monitoring during dry needling. Trigger Point Dry-Needling  Treatment instructions: Expect mild to moderate muscle soreness. S/S of pneumothorax if dry needled over a lung field, and to seek immediate medical attention should they occur. Patient verbalized understanding of these instructions and education. Patient Consent Given: Yes Education handout provided: Previously provided Muscles treated: bil cervical multifid C4-6, bil UT and levators Electrical stimulation performed: No Parameters: N/A Treatment response/outcome: Twitch Response Elicited and Palpable Increase in Muscle Length    PATIENT EDUCATION:   Education details: HEP review   Person educated: Patient Education method: Medical illustrator Education comprehension: verbalized understanding and returned demonstration  HOME EXERCISE PROGRAM: Access Code: [email or web address] URL: [email or web address] Date: 10/14/2022 Prepared by: Harrie Foreman  Exercises - Standing Lower Cervical and Upper Thoracic Stretch  - 2 x daily - 7 x weekly - 3 reps - 30 sec hold - Seated Gentle Upper Trapezius Stretch  - 2 x daily - 7 x weekly - 3 reps - 30 sec hold - Gentle Levator Scapulae Stretch (Mirrored)  - 2 x daily - 7 x weekly - 3 reps - 30 sec hold - Standing Shoulder Posterior Capsule Stretch (Mirrored)  - 2 x daily - 7 x weekly - 3 reps - 30 sec hold - Seated Thoracic Lumbar Extension with Pectoralis Stretch  - 2 x daily - 7 x weekly - 2 sets - 10 reps - 5 sec hold - Seated Shoulder Row with Anchored Resistance  - 1 x daily - 3-4 x weekly - 2 sets - 10 reps - 3-5 hold hold - Seated Shoulder Extension and Scapular Retraction with Resistance  - 1 x daily - 3-4 x weekly - 2 sets - 10 reps - 3-5 sec hold - Seated Cervical Retraction  - 1 x daily - 7 x weekly - 1-2 sets - 10 reps - Supine Chin Tuck on Pillow  - 1 x daily - 7 x weekly - 1 sets - 10 reps - Seated Scapular Retraction  - 1 x daily - 7 x weekly - 1-2 sets - 10 reps - 5 sec hold - Shoulder External Rotation and Scapular Retraction with Resistance  - 1 x daily - 7 x weekly - 3 sets - 10 reps - Median Nerve Flossing - Tray  - 1 x daily - 7 x weekly - 1 sets - 10 reps  Patient Education - Trigger Point Dry Needling   ASSESSMENT:  CLINICAL IMPRESSION: ASSIA MEANOR reports no pain but still having numbness in R forearm.  Also reports not having to take tylenol anymore for pain control.  No more episodes of hypotension since last session - accidentally drank milk instead of almond milk which may have been the cause.  Today focused on R arm to try to decrease  numbness, added flossing to HEP and provided printout, tolerated interventions well.   Walida "[name]" will benefit from continued skilled PT to address abnormal muscle tension, impaired sensation and strength deficits deficits to improve mobility and activity tolerance with decreased pain interference.   OBJECTIVE IMPAIRMENTS: decreased activity tolerance, decreased ROM, decreased strength, hypomobility, increased fascial restrictions, impaired perceived functional ability, increased muscle spasms, impaired sensation, impaired UE functional use, and pain.   ACTIVITY LIMITATIONS: carrying, lifting, bending, sitting, sleeping, reach over head, and caring for others  PARTICIPATION LIMITATIONS: meal prep, cleaning, laundry, driving, and yard work  PERSONAL FACTORS: 3+ comorbidities: T11 compression fx, 2014 pelvic fx,  sarcoidosis, Left lumbar radiculopathy, anemia, fibromyalgia , Osteoporosis, NSTEMI, R RTC tear  are also affecting patient's functional outcome.   REHAB POTENTIAL: Good  CLINICAL DECISION MAKING: Evolving/moderate complexity  EVALUATION COMPLEXITY: Moderate   GOALS: Goals reviewed with patient? Yes  SHORT TERM GOALS: Target date: 09/26/2022   Patient will be independent with initial HEP.  Baseline:  Goal status: IN PROGRESS  09/19/22 - reviewed and updated today  LONG TERM GOALS: Target date: 10/24/2022   Patient will be independent with advanced/ongoing HEP to improve outcomes and carryover.  Baseline:  Goal status: IN PROGRESS 09/26/22- good compliance, cues needed  2.  Patient will report 75% improvement in R shoulder blade pain to improve QOL.  Baseline:  Goal status: IN PROGRESS 09/26/22- no pain reported  3.  Patient will demonstrate full pain free cervical ROM for safety with driving.  Baseline: see objective Goal status: IN PROGRESS  4.  Patient will report at least 8 points improvement on NDI to demonstrate improved functional ability.  Baseline: 25/50 Goal  status: IN PROGRESS  5.  Patient will demonstrate 75% improvement in radicular symptoms.  Baseline: numbness/tingling R ulnar nerve/C8 distribution Goal status: IN PROGRESS  6. Patient will demonstrate at least 20 lbs R grip strength   Baseline: ~ 7 lbs R Goal status: IN PROGRESS   PLAN:  PT FREQUENCY: 2x/week  PT DURATION: 6 weeks  PLANNED INTERVENTIONS: Therapeutic exercises, Therapeutic activity, Neuromuscular re-education, Balance training, Gait training, Patient/Family education, Self Care, Joint mobilization, Dry Needling, Electrical stimulation, Spinal mobilization, Cryotherapy, Moist heat, Taping, Manual therapy, and Re-evaluation  PLAN FOR NEXT SESSION: Progress note.     Jena Gauss, PT, DPT 10/14/2022, 10:27 AM

## 2022-10-16 ENCOUNTER — Ambulatory Visit: Payer: Medicare Other | Attending: Nurse Practitioner

## 2022-10-16 DIAGNOSIS — I2510 Atherosclerotic heart disease of native coronary artery without angina pectoris: Secondary | ICD-10-CM

## 2022-10-17 ENCOUNTER — Encounter: Payer: Medicare Other | Admitting: Physical Therapy

## 2022-10-17 LAB — LIPID PANEL
Chol/HDL Ratio: 2.2 {ratio} (ref 0.0–4.4)
Cholesterol, Total: 156 mg/dL (ref 100–199)
HDL: 71 mg/dL (ref >39)
LDL Chol Calc (NIH): 72 mg/dL (ref 0–99)
Triglycerides: 65 mg/dL (ref 0–149)
VLDL Cholesterol Cal: 13 mg/dL (ref 5–40)

## 2022-10-17 LAB — COMPREHENSIVE METABOLIC PANEL
ALT: 21 [IU]/L (ref 0–32)
AST: 22 [IU]/L (ref 0–40)
Albumin: 4.1 g/dL (ref 3.8–4.8)
Alkaline Phosphatase: 48 [IU]/L (ref 44–121)
BUN/Creatinine Ratio: 19 (ref 12–28)
BUN: 12 mg/dL (ref 8–27)
Bilirubin Total: 0.5 mg/dL (ref 0.0–1.2)
CO2: 27 mmol/L (ref 20–29)
Calcium: 9.6 mg/dL (ref 8.7–10.3)
Chloride: 103 mmol/L (ref 96–106)
Creatinine, Ser: 0.63 mg/dL (ref 0.57–1.00)
Globulin, Total: 2.5 g/dL (ref 1.5–4.5)
Glucose: 81 mg/dL (ref 70–99)
Potassium: 4.3 mmol/L (ref 3.5–5.2)
Sodium: 140 mmol/L (ref 134–144)
Total Protein: 6.6 g/dL (ref 6.0–8.5)
eGFR: 90 mL/min/{1.73_m2} (ref >59)

## 2022-10-17 LAB — LIPOPROTEIN A (LPA): Lipoprotein (a): 25.7 nmol/L (ref <75.0)
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
## 2022-10-18 ENCOUNTER — Ambulatory Visit: Payer: Medicare Other | Attending: Cardiovascular Disease | Admitting: Cardiovascular Disease

## 2022-10-18 ENCOUNTER — Encounter: Payer: Self-pay | Admitting: Cardiovascular Disease

## 2022-10-18 VITALS — BP 128/66 | HR 68 | Ht 65.0 in | Wt 113.4 lb

## 2022-10-18 DIAGNOSIS — I493 Ventricular premature depolarization: Secondary | ICD-10-CM

## 2022-10-18 DIAGNOSIS — I2510 Atherosclerotic heart disease of native coronary artery without angina pectoris: Secondary | ICD-10-CM | POA: Diagnosis not present

## 2022-10-18 DIAGNOSIS — I340 Nonrheumatic mitral (valve) insufficiency: Secondary | ICD-10-CM | POA: Diagnosis not present

## 2022-10-18 DIAGNOSIS — I951 Orthostatic hypotension: Secondary | ICD-10-CM

## 2022-10-18 NOTE — Patient Instructions (Signed)
Medication Instructions:  No changes *If you need a refill on your cardiac medications before your next appointment, please call your pharmacy*   Lab Work: none   Testing/Procedures: ECHO DUE APRIL 2025 Your physician has requested that you have an echocardiogram. Echocardiography is a painless test that uses sound waves to create images of your heart. It provides your doctor with information about the size and shape of your heart and how well your heart's chambers and valves are working. This procedure takes approximately one hour. There are no restrictions for this procedure. Please do NOT wear cologne, perfume, aftershave, or lotions (deodorant is allowed). Please arrive 15 minutes prior to your appointment time.   Follow-Up: At [facility], you and your health needs are our priority.  As part of our continuing mission to provide you with exceptional heart care, we have created designated Provider Care Teams.  These Care Teams include your primary Cardiologist (physician) and Advanced Practice Providers (APPs -  Physician Assistants and Nurse Practitioners) who all work together to provide you with the care you need, when you need it.   Your next appointment:   12 month(s)  Provider:   Verne Carrow, MD

## 2022-10-21 ENCOUNTER — Encounter: Payer: Self-pay | Admitting: Physical Therapy

## 2022-10-21 ENCOUNTER — Ambulatory Visit: Payer: Medicare Other | Attending: Orthopedic Surgery | Admitting: Physical Therapy

## 2022-10-21 DIAGNOSIS — M6281 Muscle weakness (generalized): Secondary | ICD-10-CM

## 2022-10-21 DIAGNOSIS — M25511 Pain in right shoulder: Secondary | ICD-10-CM

## 2022-10-21 DIAGNOSIS — R252 Cramp and spasm: Secondary | ICD-10-CM

## 2022-10-21 DIAGNOSIS — M542 Cervicalgia: Secondary | ICD-10-CM

## 2022-10-21 NOTE — Therapy (Signed)
OUTPATIENT PHYSICAL THERAPY TREATMENT Progress Note Reporting Period 09/12/22 to 10/21/22  See note below for Objective Data and Assessment of Progress/Goals.      Patient Name: Sheena Craig MRN: [id number] DOB:01/25/1940, 82 y.o., female Today's Date: 10/21/2022  END OF SESSION:  PT End of Session - 10/21/22 0926     Visit Number 10    Date for PT Re-Evaluation 10/24/22    Authorization Type Blue MCR    Progress Note Due on Visit 10    PT Start Time 0930    PT Stop Time 1015    PT Time Calculation (min) 45 min    Activity Tolerance Patient tolerated treatment well    Behavior During Therapy [facility] for tasks assessed/performed              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   Sarcoidosis 10/17/2006   Past Surgical History:  Procedure Laterality Date   CATARACT EXTRACTION     bilateral   COLONOSCOPY     EXCISION METACARPAL MASS Right 07/24/2015   Procedure: EXCISION OF RIGHT LONG FINGER  MASS;  Surgeon: Mack Hook, MD;  Location:  SURGERY CENTER;  Service: Orthopedics;  Laterality: Right;   LYMPH NODE BIOPSY  1968   PELVIC FRACTURE SURGERY  03-04-12    [facility]surgery at [facility]   UPPER GASTROINTESTINAL ENDOSCOPY     Patient Active Problem List   Diagnosis Date Noted   Hospital discharge follow-up 06/25/2022   GAD (generalized anxiety disorder) 06/25/2022   Weight loss 04/29/2022   Constipation 08/30/2021   Tremor 08/30/2021   Lumbar radiculopathy, right 05/05/2021   Encounter for well adult exam with abnormal findings 08/29/2020   Vitamin D deficiency  08/29/2020   Abnormal urine odor 08/24/2020   B12 deficiency 02/26/2020   Palpitations 09/16/2019   HLD (hyperlipidemia) 12/04/2018   Left groin pain 11/30/2015   Dysphagia 11/19/2015   Insect bites 11/19/2015   Sacral back pain 11/19/2015   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   Blepharitis of both upper and lower eyelid of left eye 04/21/2015   Blepharitis of both upper and lower eyelid of right eye 04/21/2015   Degenerative drusen of both eyes 04/21/2015   Keratoconjunctivitis sicca of both eyes not specified as Sjogren's 04/21/2015   Pseudophakia of both eyes 04/21/2015   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   Dizziness 11/11/2014   Ganglion cyst of flexor tendon sheath of finger of right hand 06/18/2014   Bronchiectasis without acute exacerbation (HCC) 06/30/2012   Cough 04/09/2012   Heart murmur 03/25/2012   Abnormal CT scan, chest 03/25/2012   Peripheral edema 03/25/2012   Liver lesion 03/25/2012   Pancreatic lesion 03/25/2012   Coronary artery calcification seen on CAT scan 03/25/2012   Degenerative disc disease, cervical 03/25/2012   Degenerative disc disease, lumbar 03/25/2012   Syncope 03/13/2012   Pain 03/04/2012   Respiratory insufficiency following shock, trauma, or surgery 03/04/2012   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   Hypothyroidism 10/21/2011   Preventative health care 10/15/2010   LUMBAR RADICULOPATHY, LEFT 10/06/2009   FIBROMYALGIA 02/09/2007   Osteoporosis 02/09/2007   Sarcoidosis 10/17/2006  Anemia, iron deficiency 10/17/2006   Depression 10/17/2006   Migraine headache 10/17/2006   GERD 10/17/2006   Osteoarthritis 10/17/2006    PCP: Charlton Amor, DO  REFERRING PROVIDER: Case, [place], MD  REFERRING DIAG: M54.2 (ICD-10-CM) - Cervicalgia   THERAPY DIAG:  Cervicalgia  Cramp and spasm  Muscle weakness (generalized)  Acute pain of right shoulder  Rationale for Evaluation and Treatment: Rehabilitation  ONSET DATE:  08/26/22 class, 08/28/22 ED visit  SUBJECTIVE:                                                                                                                                                                                                         SUBJECTIVE STATEMENT: Just a little achey - 1-2.  Low back was achey yesterday after watching play at [facility].  Still has numbness, but feels like it is getting better.    EVAL:  Pain starts in R shoulder blade and radiates down R shoulder with numbness,  had heart attck in June 6, so started cardiac rehab, went 13 times, last exercise on 12 of August said sit on edge of seat with theratubing doing UE exercises, whole time thinking not good to do this without back support, the whole next day I was doing things left handed, that night could not sleep, pain was excruciating, the next day went to hospital, then my heart started acting up too, so they focused on that instead.  They sent me to orthopedics who gave me a trigger point injection which helped a little.  She was given a muscle relaxor but it makes her drowzy, she even uses a walker at home for safety.  It still hurts, best position is leaning forward with arms supported, and my right hand feels weak.  Hand dominance: Right  PERTINENT HISTORY:  T11 compression fx, 2014 pelvic fx, sarcoidosis, Left lumbar radiculopathy, anemia, fibromyalgia , R RTC tear, Osteoporosis, NSTEMI, constipation  PAIN:  Are you having pain? Yes: NPRS scale: 1-2/10 Pain location: achey.  radiates from shoulder blade down R arm to hand.   Pain description: throbbing, aching, numb Aggravating factors: sitting without support, lifting > 1 lb,  Relieving factors: leaning forward arms supported, Voltaren.  PRECAUTIONS: Fall  RED FLAGS: None    WEIGHT BEARING RESTRICTIONS: No  FALLS:  Has patient fallen in last 6 months? No  LIVING ENVIRONMENT: Lives with: lives with their spouse Lives in: House/apartment  OCCUPATION:  retired   PLOF: Independent  PATIENT GOALS: not have pain, return to previous norm- gardening, etc   NEXT MD VISIT:  10/18/2022  OBJECTIVE:   DIAGNOSTIC FINDINGS:  Not available for review, per MD notes X-rays of R shoulder from 08/28/2022 showed no evidence of fracture or advanced degenerative change.   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    CERVICAL ROM:   Active ROM A/PROM (deg) eval AROM 10/21/22  Flexion 42 tight 45  Extension 43 45  Right lateral flexion    Left lateral flexion    Right rotation 66 "zing" 68 ache  Left rotation 68 tight 70   (Blank rows = not tested)  UPPER EXTREMITY ROM:  WNL for flexion and abduction, slightly limited R shoulder functional internal rotation due to history of RTC tear.    UPPER EXTREMITY MMT:  MMT Right eval Left eval  Shoulder flexion 5 5  Shoulder extension 5 5  Shoulder abduction 5 5  Shoulder internal rotation 5 5  Shoulder external rotation 5 5  Elbow flexion 5 5  Elbow extension 5 5  Wrist flexion 5 5  Wrist extension 5 5  Grip strength 7lb 27lb   (Blank rows = not tested) 10/21/22  Grip strength left 20, 25lbs, R 20, 20 lbs   CERVICAL SPECIAL TESTS:  NT  FUNCTIONAL TESTS:  NT  TODAY'S TREATMENT:                                                                                                                              DATE:  10/21/22 Therapeutic Exercise: to improve strength and mobility.  Demo, verbal and tactile cues throughout for technique. Nustep L5 x 6 min Review of HEP  -  importance of posterior shoulder strengthening, start quadruped arm raises working up toward bird dogs, also bridges Therapeutic Activity:  to assess progress towards goals ROM, grip strength testing, NDI Manual Therapy: to decrease muscle spasm and pain and improve mobility STM/TPR to R rhomboids, levator scapulae, pecs, R anterior scalenes, mobs to R elbow.    10/14/22 Therapeutic Exercise: to improve strength and mobility.  Demo, verbal and tactile cues throughout for technique. UBE x (26f/2b) Review of HEP, added median nerve floss (tray to HEP) and printed. Manual Therapy: to decrease muscle spasm and pain and improve mobility Mobs to R elbow and wrist, IASTM to R pectoralis, arm and forearm, skilled palpation and monitoring during dry needling. Trigger Point Dry-Needling  Treatment instructions: Expect mild to moderate muscle soreness. S/S of pneumothorax if dry needled over a lung field, and to seek immediate medical attention should they occur. Patient verbalized understanding of these instructions and education. Patient Consent Given: Yes Education handout provided: Previously provided Muscles treated: R  pectoralis Electrical stimulation performed: No Parameters: N/A Treatment response/outcome: Twitch Response Elicited and Palpable Increase in Muscle Length   10/10/22 Therapeutic Exercise: to improve strength and mobility.  Demo, verbal and tactile cues throughout for technique. UBE L1 x 6 min (82f/3b) Review of HEP Ulnar nerve floss Therapeutic Activity:  vitals assessed.  BP 90/50 Manual Therapy: to decrease muscle spasm and pain and improve mobility STM/TPR to cervical paraspinals, R UT, R L/S   PATIENT EDUCATION:  Education details: HEP review   Person educated: Patient Education method: Medical illustrator Education comprehension: verbalized understanding and returned demonstration  HOME EXERCISE PROGRAM: Access Code: [email or web address] URL:  [email or web address] Date: 10/14/2022 Prepared by: Harrie Foreman  Exercises - Standing Lower Cervical and Upper Thoracic Stretch  - 2 x daily - 7 x weekly - 3 reps - 30 sec hold - Seated Gentle Upper Trapezius Stretch  - 2 x daily - 7 x weekly - 3 reps - 30 sec hold - Gentle Levator Scapulae Stretch (Mirrored)  - 2 x daily - 7 x weekly - 3 reps - 30 sec hold - Standing Shoulder Posterior Capsule Stretch (Mirrored)  - 2 x daily - 7 x weekly - 3 reps - 30 sec hold - Seated Thoracic Lumbar Extension with Pectoralis Stretch  - 2 x daily - 7 x weekly - 2 sets - 10 reps - 5 sec hold - Seated Shoulder Row with Anchored Resistance  - 1 x daily - 3-4 x weekly - 2 sets - 10 reps - 3-5 hold hold - Seated Shoulder Extension and Scapular Retraction with Resistance  - 1 x daily - 3-4 x weekly - 2 sets - 10 reps - 3-5 sec hold - Seated Cervical Retraction  - 1 x daily - 7 x weekly - 1-2 sets - 10 reps - Supine Chin Tuck on Pillow  - 1 x daily - 7 x weekly - 1 sets - 10 reps - Seated Scapular Retraction  - 1 x daily - 7 x weekly - 1-2 sets - 10 reps - 5 sec hold - Shoulder External Rotation and Scapular Retraction with Resistance  - 1 x daily - 7 x weekly - 3 sets - 10 reps - Median Nerve Flossing - Tray  - 1 x daily - 7 x weekly - 1 sets - 10 reps  Patient Education - Trigger Point Dry Needling   ASSESSMENT:  CLINICAL IMPRESSION: "Sheena Craig" reports 85% improvement in pain overall, but still has numbness in R forearm although this is improving.  Otherwise she has made good progress in PT and has met LTG #1,2,3,4 and 6.  Reviewed HEP, recommended continue to work on posterior shoulder strengthening to help with scapular discomfort along with stretches.  Continued work on tightness along RUE and neck to help with numbness.   Sheena "[name]" will benefit from continued skilled PT to address abnormal muscle tension, impaired sensation and strength deficits deficits to improve mobility and activity  tolerance with decreased pain interference.   OBJECTIVE IMPAIRMENTS: decreased activity tolerance, decreased ROM, decreased strength, hypomobility, increased fascial restrictions, impaired perceived functional ability, increased muscle spasms, impaired sensation, impaired UE functional use, and pain.   ACTIVITY LIMITATIONS: carrying, lifting, bending, sitting, sleeping, reach over head, and caring for others  PARTICIPATION LIMITATIONS: meal prep, cleaning, laundry, driving, and yard work  PERSONAL FACTORS: 3+ comorbidities: T11 compression fx, 2014 pelvic fx, sarcoidosis, Left lumbar radiculopathy, anemia, fibromyalgia , Osteoporosis, NSTEMI, R RTC tear  are also affecting patient's functional outcome.  REHAB POTENTIAL: Good  CLINICAL DECISION MAKING: Evolving/moderate complexity  EVALUATION COMPLEXITY: Moderate   GOALS: Goals reviewed with patient? Yes  SHORT TERM GOALS: Target date: 09/26/2022   Patient will be independent with initial HEP.  Baseline:  Goal status: MET  09/26/22  LONG TERM GOALS: Target date: 10/24/2022   Patient will be independent with advanced/ongoing HEP to improve outcomes and carryover.  Baseline:  Goal status: MET 09/26/22- good compliance, cues needed 10/21/22  2.  Patient will report 75% improvement in R shoulder blade pain to improve QOL.  Baseline:  Goal status: MET 09/26/22- no pain reported  10/21/22 - significant improvement overall, no pain medications needed, 85% improvement.   3.  Patient will demonstrate full pain free cervical ROM for safety with driving.  Baseline: see objective Goal status: MET 10/21/22 see objective  4.  Patient will report at least 8 points improvement on NDI to demonstrate improved functional ability.  Baseline: 25/50 Goal status: MET 10/21/22 5/50  5.  Patient will demonstrate 75% improvement in radicular symptoms.  Baseline: numbness/tingling R ulnar nerve/C8 distribution Goal status: IN PROGRESS 10/21/22 -  significant improvement in pain, but still has numbness  6. Patient will demonstrate at least 20 lbs R grip strength   Baseline: ~ 7 lbs R Goal status: MET 10/21/22 20lbs on R    PLAN:  PT FREQUENCY: 2x/week  PT DURATION: 6 weeks  PLANNED INTERVENTIONS: Therapeutic exercises, Therapeutic activity, Neuromuscular re-education, Balance training, Gait training, Patient/Family education, Self Care, Joint mobilization, Dry Needling, Electrical stimulation, Spinal mobilization, Cryotherapy, Moist heat, Taping, Manual therapy, and Re-evaluation  PLAN FOR NEXT SESSION: plan to d/c next session or 30 day hold.      Jena Gauss, PT, DPT 10/21/2022, 4:15 PM

## 2022-10-22 ENCOUNTER — Encounter: Payer: Self-pay | Admitting: Family Medicine

## 2022-10-22 ENCOUNTER — Other Ambulatory Visit: Payer: Self-pay | Admitting: Internal Medicine

## 2022-10-24 ENCOUNTER — Encounter: Payer: Self-pay | Admitting: Physical Therapy

## 2022-10-24 ENCOUNTER — Ambulatory Visit: Payer: Medicare Other | Admitting: Physical Therapy

## 2022-10-24 DIAGNOSIS — M542 Cervicalgia: Secondary | ICD-10-CM

## 2022-10-24 DIAGNOSIS — M6281 Muscle weakness (generalized): Secondary | ICD-10-CM | POA: Diagnosis not present

## 2022-10-24 DIAGNOSIS — M25511 Pain in right shoulder: Secondary | ICD-10-CM | POA: Diagnosis not present

## 2022-10-24 DIAGNOSIS — R252 Cramp and spasm: Secondary | ICD-10-CM

## 2022-10-24 MED ORDER — LEVOTHYROXINE SODIUM 100 MCG PO TABS: 100.0000 ug | ORAL_TABLET | Freq: Every day | ORAL | 3 refills | Status: DC
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
## 2022-10-30 ENCOUNTER — Encounter: Payer: Self-pay | Admitting: Family Medicine

## 2022-10-30 ENCOUNTER — Ambulatory Visit (INDEPENDENT_AMBULATORY_CARE_PROVIDER_SITE_OTHER): Payer: Medicare Other | Admitting: Family Medicine

## 2022-10-30 VITALS — BP 111/58 | HR 84 | Ht 64.27 in | Wt 114.2 lb

## 2022-10-30 DIAGNOSIS — D510 Vitamin B12 deficiency anemia due to intrinsic factor deficiency: Secondary | ICD-10-CM

## 2022-10-30 DIAGNOSIS — E039 Hypothyroidism, unspecified: Secondary | ICD-10-CM

## 2022-10-30 DIAGNOSIS — Z23 Encounter for immunization: Secondary | ICD-10-CM | POA: Diagnosis not present

## 2022-10-30 DIAGNOSIS — M810 Age-related osteoporosis without current pathological fracture: Secondary | ICD-10-CM | POA: Diagnosis not present

## 2022-10-30 DIAGNOSIS — F411 Generalized anxiety disorder: Secondary | ICD-10-CM

## 2022-10-30 NOTE — Assessment & Plan Note (Signed)
Pt doing well  Continue meds

## 2022-10-30 NOTE — Assessment & Plan Note (Addendum)
Continue prolia injections, calcium normal on CMP two weeks ago

## 2022-10-30 NOTE — Progress Notes (Signed)
Established patient visit   Patient: Sheena Craig   DOB: 1940-02-04   82 y.o. Female  MRN: [id number] Visit Date: 10/30/2022  Today's healthcare provider: Charlton Amor, DO   Chief Complaint  Patient presents with   Medical Management of Chronic Issues    Digestive problem, heart attack f/u    SUBJECTIVE    Chief Complaint  Patient presents with   Medical Management of Chronic Issues    Digestive problem, heart attack f/u   HPI HPI     Medical Management of Chronic Issues    Additional comments: Digestive problem, heart attack f/u      Last edited by Roselyn Reef, CMA on 10/30/2022  2:02 PM.      Pt presents for follow up.   Pmh hypothyroidism  - on levothyroxine   Hx of CAD currently in cardiac rehab and doing well  - on plavix and crestor   Osteoporosis - on prolia injections and coming next week for injection   At last visit was taken off prozac for GAD and is doing well.   B12 deficiency on monthly injections  Digestive issues have resolved after cutting out gluten  Review of Systems  Constitutional:  Negative for activity change, fatigue and fever.  Respiratory:  Negative for cough and shortness of breath.   Cardiovascular:  Negative for chest pain.  Gastrointestinal:  Negative for abdominal pain.  Genitourinary:  Negative for difficulty urinating.       No outpatient medications have been marked as taking for the 10/30/22 encounter (Office Visit) with Charlton Amor, DO.    OBJECTIVE    BP (!) 111/58 (BP Location: Left Arm, Patient Position: Sitting, Cuff Size: Normal)   Pulse 84   Ht 5' 4.27" (1.632 m)   Wt 114 lb 4 oz (51.8 kg)   SpO2 97%   BMI 19.45 kg/m   Physical Exam Vitals reviewed.  Constitutional:      Appearance: She is well-developed.  HENT:     Head: Normocephalic and atraumatic.  Eyes:     Conjunctiva/sclera: Conjunctivae normal.  Cardiovascular:     Rate and Rhythm: Normal rate.  Pulmonary:      Effort: Pulmonary effort is normal.  Skin:    General: Skin is dry.     Coloration: Skin is not pale.  Neurological:     Mental Status: She is alert and oriented to person, place, and time.  Psychiatric:        Behavior: Behavior normal.        ASSESSMENT & PLAN    Problem List Items Addressed This Visit       Endocrine   Hypothyroidism    Pt doing well  Continue meds        Musculoskeletal and Integument   Osteoporosis    Continue prolia injections, calcium normal on CMP two weeks ago        Other   Pernicious anemia-B12 deficiency - Primary    Continue b12 injections      GAD (generalized anxiety disorder)    Doing well no changes        Return in about 7 months (around 06/02/2023).      No orders of the defined types were placed in this encounter.   No orders of the defined types were placed in this encounter.    Charlton Amor, DO  [facility] at [facility] [phone] (phone) [phone] (fax)  [facility]

## 2022-10-30 NOTE — Assessment & Plan Note (Signed)
Continue b12 injections.

## 2022-10-30 NOTE — Assessment & Plan Note (Signed)
Doing well no changes

## 2022-11-04 DIAGNOSIS — I214 Non-ST elevation (NSTEMI) myocardial infarction: Secondary | ICD-10-CM | POA: Diagnosis not present

## 2022-11-06 DIAGNOSIS — I214 Non-ST elevation (NSTEMI) myocardial infarction: Secondary | ICD-10-CM | POA: Diagnosis not present

## 2022-11-07 DIAGNOSIS — I214 Non-ST elevation (NSTEMI) myocardial infarction: Secondary | ICD-10-CM | POA: Diagnosis not present

## 2022-11-08 ENCOUNTER — Ambulatory Visit: Payer: Medicare Other

## 2022-11-11 DIAGNOSIS — I214 Non-ST elevation (NSTEMI) myocardial infarction: Secondary | ICD-10-CM | POA: Diagnosis not present

## 2022-11-13 DIAGNOSIS — I214 Non-ST elevation (NSTEMI) myocardial infarction: Secondary | ICD-10-CM | POA: Diagnosis not present

## 2022-11-14 DIAGNOSIS — I214 Non-ST elevation (NSTEMI) myocardial infarction: Secondary | ICD-10-CM | POA: Diagnosis not present

## 2022-11-15 ENCOUNTER — Ambulatory Visit (INDEPENDENT_AMBULATORY_CARE_PROVIDER_SITE_OTHER): Payer: Medicare Other | Admitting: Family Medicine

## 2022-11-15 VITALS — BP 105/47 | HR 76

## 2022-11-15 DIAGNOSIS — M810 Age-related osteoporosis without current pathological fracture: Secondary | ICD-10-CM | POA: Diagnosis not present

## 2022-11-15 MED ORDER — DENOSUMAB 60 MG/ML ~~LOC~~ SOSY
60.0000 mg | PREFILLED_SYRINGE | Freq: Once | SUBCUTANEOUS | Status: DC
Start: 2022-11-15 — End: 2023-11-27

## 2022-11-15 MED ORDER — DENOSUMAB 60 MG/ML ~~LOC~~ SOSY
60.0000 mg | PREFILLED_SYRINGE | SUBCUTANEOUS | Status: DC
Start: 2022-11-15 — End: 2023-11-27
  Administered 2022-11-15: 60 mg via SUBCUTANEOUS

## 2022-11-15 NOTE — Progress Notes (Signed)
Patient here to have her Prolia injection done.   She is taking vitamin D and calcium supplements as directed.  This was administered in her L arm. Pt tolerated this well.   She will RTC in 6 months to have her next injection done.

## 2022-11-18 DIAGNOSIS — I214 Non-ST elevation (NSTEMI) myocardial infarction: Secondary | ICD-10-CM | POA: Diagnosis not present

## 2022-11-20 DIAGNOSIS — I214 Non-ST elevation (NSTEMI) myocardial infarction: Secondary | ICD-10-CM | POA: Diagnosis not present

## 2022-11-21 DIAGNOSIS — I214 Non-ST elevation (NSTEMI) myocardial infarction: Secondary | ICD-10-CM | POA: Diagnosis not present

## 2022-11-28 DIAGNOSIS — I214 Non-ST elevation (NSTEMI) myocardial infarction: Secondary | ICD-10-CM | POA: Diagnosis not present

## 2022-12-02 DIAGNOSIS — I214 Non-ST elevation (NSTEMI) myocardial infarction: Secondary | ICD-10-CM | POA: Diagnosis not present

## 2022-12-03 ENCOUNTER — Encounter: Payer: Self-pay | Admitting: Cardiovascular Disease

## 2022-12-04 DIAGNOSIS — I214 Non-ST elevation (NSTEMI) myocardial infarction: Secondary | ICD-10-CM | POA: Diagnosis not present

## 2022-12-05 DIAGNOSIS — I214 Non-ST elevation (NSTEMI) myocardial infarction: Secondary | ICD-10-CM | POA: Diagnosis not present

## 2022-12-06 DIAGNOSIS — F4320 Adjustment disorder, unspecified: Secondary | ICD-10-CM | POA: Diagnosis not present

## 2022-12-07 ENCOUNTER — Ambulatory Visit: Payer: Medicare Other

## 2022-12-07 ENCOUNTER — Ambulatory Visit
Admission: RE | Admit: 2022-12-07 | Discharge: 2022-12-07 | Disposition: A | Discharge status: Home / Self Care | Payer: Medicare Other | Source: Ambulatory Visit | Attending: Physician Assistant | Admitting: Physician Assistant

## 2022-12-07 VITALS — BP 126/68 | HR 75 | Temp 97.8°F | Resp 18

## 2022-12-07 DIAGNOSIS — L03113 Cellulitis of right upper limb: Secondary | ICD-10-CM

## 2022-12-07 DIAGNOSIS — M79631 Pain in right forearm: Secondary | ICD-10-CM | POA: Diagnosis not present

## 2022-12-07 MED ORDER — DOXYCYCLINE HYCLATE 100 MG PO CAPS
100.0000 mg | ORAL_CAPSULE | Freq: Two times a day (BID) | ORAL | 0 refills | Status: AC
Start: 2022-12-07 — End: 2022-12-14

## 2022-12-07 NOTE — ED Provider Notes (Signed)
[facility]    CSN: [id number] Arrival date & time: 12/07/22  1132      History   Chief Complaint Chief Complaint  Patient presents with   Arm Injury    Want to rule out a fracture in the forearm? - Entered by patient    HPI Sheena Craig is a 82 y.o. female.   Patient here today for evaluation of swelling and pain to her right forearm.  She reports that she is most concerned for possible fracture she has had stress fracture in the past.  She denies any injury.  She states initially area looked like a bruise yesterday and this morning has had more swelling and erythema surrounding.  She denies any fever.  The history is provided by the patient.  Arm Injury Associated symptoms: no fever     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   Sarcoidosis 10/17/2006    Patient Active Problem List   Diagnosis Date Noted   Hospital discharge follow-up 06/25/2022   GAD (generalized anxiety disorder) 06/25/2022   Weight loss 04/29/2022   Constipation 08/30/2021   Tremor 08/30/2021   Lumbar radiculopathy, right 05/05/2021   Encounter for well adult exam with abnormal findings 08/29/2020   Vitamin D deficiency 08/29/2020   Abnormal urine odor 08/24/2020   B12 deficiency 02/26/2020   Palpitations 09/16/2019   HLD (hyperlipidemia) 12/04/2018   Left groin pain 11/30/2015   Dysphagia 11/19/2015   Insect bites 11/19/2015   Sacral back pain 11/19/2015   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   Blepharitis of both upper and lower eyelid of left eye 04/21/2015   Blepharitis of both  upper and lower eyelid of right eye 04/21/2015   Degenerative drusen of both eyes 04/21/2015   Keratoconjunctivitis sicca of both eyes not specified as Sjogren's 04/21/2015   Pseudophakia of both eyes 04/21/2015   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   Dizziness 11/11/2014   Ganglion cyst of flexor tendon sheath of finger of right hand 06/18/2014   Bronchiectasis without acute exacerbation (HCC) 06/30/2012   Cough 04/09/2012   Heart murmur 03/25/2012   Abnormal CT scan, chest 03/25/2012   Peripheral edema 03/25/2012   Liver lesion 03/25/2012   Pancreatic lesion 03/25/2012   Coronary artery calcification seen on CAT scan 03/25/2012   Degenerative disc disease, cervical 03/25/2012   Degenerative disc disease, lumbar 03/25/2012   Syncope 03/13/2012   Pain 03/04/2012   Respiratory insufficiency following shock, trauma, or surgery 03/04/2012   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   Hypothyroidism 10/21/2011   Preventative health care 10/15/2010   LUMBAR RADICULOPATHY, LEFT 10/06/2009   FIBROMYALGIA 02/09/2007   Osteoporosis 02/09/2007   Sarcoidosis 10/17/2006   Anemia, iron deficiency 10/17/2006   Depression 10/17/2006   Migraine headache 10/17/2006   GERD 10/17/2006   Osteoarthritis 10/17/2006    Past Surgical History:  Procedure Laterality Date   CATARACT EXTRACTION     bilateral   COLONOSCOPY     EXCISION METACARPAL MASS Right 07/24/2015   Procedure: EXCISION OF RIGHT LONG FINGER  MASS;  Surgeon: Mack Hook, MD;  Location: [facility];  Service: Orthopedics;  Laterality: Right;   LYMPH NODE BIOPSY  1968   PELVIC FRACTURE SURGERY  03-04-12    M[facility]-surgery at [facility]   UPPER GASTROINTESTINAL ENDOSCOPY      OB History   No obstetric history on file.      Home Medications    Prior to Admission medications   Medication Sig Start Date End Date Taking? Authorizing Provider  doxycycline (VIBRAMYCIN) 100 MG capsule Take 1 capsule (100  mg total) by mouth 2 (two) times daily for 7 days. 12/07/22 12/14/22 Yes Tomi Bamberger, PA-C  Calcium-Vitamin D-Vitamin K (CALCIUM SOFT CHEWS PO) Take 3 tablets by mouth daily.    [provider]  clopidogrel (PLAVIX) 75 MG tablet Take 1 tablet (75 mg total) by mouth daily. 09/17/22   Charlton Amor, DO  cyanocobalamin (VITAMIN B12) 1000 MCG/ML injection INJECT 1 ML INTRAMUSCULARLY MONTHLY AS DIRECTED 09/02/22   Charlton Amor, DO  dextromethorphan-guaiFENesin (MUCINEX DM) 30-600 MG per 12 hr tablet Take 1 tablet by mouth 2 (two) times daily as needed for cough.    [provider]  docusate sodium (COLACE) 100 MG capsule Take 100 mg by mouth daily as needed for mild constipation.    [provider]  levothyroxine (SYNTHROID) 100 MCG tablet Take 1 tablet (100 mcg total) by mouth daily. 10/24/22   Charlton Amor, DO  NONFORMULARY OR COMPOUNDED ITEM 1 tablet daily.    [provider]  NONFORMULARY OR COMPOUNDED ITEM 1 tablet daily.    [provider]  polyethylene glycol powder (GLYCOLAX/MIRALAX) 17 GM/SCOOP powder Take 1 Container by mouth once.    [provider]  rosuvastatin (CRESTOR) 20 MG tablet Take 1 tablet (20 mg total) by mouth daily. 09/23/22   Charlton Amor, DO  SYRINGE-NEEDLE, DISP, 3 ML (B-D 3CC LUER-LOK SYR 25GX1") 25G X 1" 3 ML MISC USE FOR B12 SHOTS ONCE A MONTH 09/02/22   Charlton Amor, DO    Family History Family History  Problem Relation Age of Onset   Hypertension Mother    Anxiety disorder Mother    Heart disease Mother    Rheum arthritis Mother    Alcohol abuse Father    Anxiety disorder Father    Heart disease Father    Rheum arthritis Father    Aneurysm Father    Skin cancer Father    Hypertension Father    Uterine cancer Sister    Heart disease Brother    Heart attack Brother    Breast cancer Maternal Aunt    Celiac disease Other        nieces and nephews   Colon cancer Neg Hx     Social History Social  History   Tobacco Use   Smoking status: Never    Passive exposure: Never   Smokeless tobacco: Never  Vaping Use   Vaping status: Never Used  Substance Use Topics   Alcohol use: No   Drug use: No     Allergies   Adhesive [tape], Aspartame and phenylalanine, Cefdinir, Dimetapp children's cold-cough, Pravastatin sodium, Other, and Pseudoephedrine   Review of Systems Review of Systems  Constitutional:  Negative for chills and fever.  Eyes:  Negative for discharge and redness.  Gastrointestinal:  Negative for abdominal pain, nausea and vomiting.  Skin:  Positive for color change. Negative for wound.  Neurological:  Negative for numbness.     Physical Exam Triage Vital Signs ED Triage Vitals  Encounter Vitals Group  BP 12/07/22 1212 126/68     Systolic BP Percentile --      Diastolic BP Percentile --      Pulse Rate 12/07/22 1212 75     Resp 12/07/22 1212 18     Temp 12/07/22 1212 97.8 F (36.6 C)     Temp Source 12/07/22 1212 Oral     SpO2 12/07/22 1212 98 %     Weight --      Height --      Head Circumference --      Peak Flow --      Pain Score 12/07/22 1214 3     Pain Loc --      Pain Education --      Exclude from Growth Chart --    No data found.  Updated Vital Signs BP 126/68 (BP Location: Left Arm)   Pulse 75   Temp 97.8 F (36.6 C) (Oral)   Resp 18   SpO2 98%   Visual Acuity Right Eye Distance:   Left Eye Distance:   Bilateral Distance:    Right Eye Near:   Left Eye Near:    Bilateral Near:     Physical Exam Vitals and nursing note reviewed.  Constitutional:      General: She is not in acute distress.    Appearance: Normal appearance. She is not ill-appearing.  HENT:     Head: Normocephalic and atraumatic.  Eyes:     Conjunctiva/sclera: Conjunctivae normal.  Cardiovascular:     Rate and Rhythm: Normal rate.  Pulmonary:     Effort: Pulmonary effort is normal.  Skin:    Comments: Approximately 6 cm area of erythema surrounding  central area of bruising to right forearm with mild swelling.  Minimal tenderness palpation noted.  Neurological:     Mental Status: She is alert.  Psychiatric:        Mood and Affect: Mood normal.        Behavior: Behavior normal.        Thought Content: Thought content normal.      UC Treatments / Results  Labs (all labs ordered are listed, but only abnormal results are displayed) Labs Reviewed - No data to display  EKG   Radiology DG Forearm Right  Result Date: 12/07/2022 CLINICAL DATA:  Bruising and pain EXAM: RIGHT FOREARM - 2 VIEW COMPARISON:  None Available. FINDINGS: No signs of acute fracture or dislocation. No significant arthropathy. Soft tissues appear normal. IMPRESSION: Negative. Electronically Signed   By: Signa Kell M.D.   On: 12/07/2022 13:15    Procedures Procedures (including critical care time)  Medications Ordered in UC Medications - No data to display  Initial Impression / Assessment and Plan / UC Course  I have reviewed the triage vital signs and the nursing notes.  Pertinent labs & imaging results that were available during my care of the patient were reviewed by me and considered in my medical decision making (see chart for details).    X-ray without fracture.  Given bruising and then development of erythema concern for possible cellulitis.  Will treat with doxycycline and advised patient to continue to monitor symptoms and report to the emergency room with any worsening.  Patient expressed understanding.  Final Clinical Impressions(s) / UC Diagnoses   Final diagnoses:  Cellulitis of right upper extremity   Discharge Instructions   None    ED Prescriptions     Medication Sig Dispense Auth. Provider   doxycycline (VIBRAMYCIN) 100 MG  capsule Take 1 capsule (100 mg total) by mouth 2 (two) times daily for 7 days. 14 capsule Tomi Bamberger, PA-C      PDMP not reviewed this encounter.   Tomi Bamberger, PA-C 12/07/22 1512

## 2022-12-07 NOTE — ED Triage Notes (Signed)
Onset of right forearm pain yesterday morning. Today right forearm is discolored with area of swelling. Patient is on clopidogrel due to an MI in June. Does not recall injury.Good CMS to right hand.

## 2022-12-09 DIAGNOSIS — I214 Non-ST elevation (NSTEMI) myocardial infarction: Secondary | ICD-10-CM | POA: Diagnosis not present

## 2022-12-11 DIAGNOSIS — I214 Non-ST elevation (NSTEMI) myocardial infarction: Secondary | ICD-10-CM | POA: Diagnosis not present

## 2022-12-16 DIAGNOSIS — I214 Non-ST elevation (NSTEMI) myocardial infarction: Secondary | ICD-10-CM | POA: Diagnosis not present

## 2022-12-18 DIAGNOSIS — I214 Non-ST elevation (NSTEMI) myocardial infarction: Secondary | ICD-10-CM | POA: Diagnosis not present

## 2022-12-19 DIAGNOSIS — I214 Non-ST elevation (NSTEMI) myocardial infarction: Secondary | ICD-10-CM | POA: Diagnosis not present

## 2022-12-23 DIAGNOSIS — I214 Non-ST elevation (NSTEMI) myocardial infarction: Secondary | ICD-10-CM | POA: Diagnosis not present

## 2022-12-25 DIAGNOSIS — I214 Non-ST elevation (NSTEMI) myocardial infarction: Secondary | ICD-10-CM | POA: Diagnosis not present

## 2022-12-26 DIAGNOSIS — I214 Non-ST elevation (NSTEMI) myocardial infarction: Secondary | ICD-10-CM | POA: Diagnosis not present

## 2022-12-30 DIAGNOSIS — I214 Non-ST elevation (NSTEMI) myocardial infarction: Secondary | ICD-10-CM | POA: Diagnosis not present

## 2023-01-01 ENCOUNTER — Other Ambulatory Visit: Payer: Self-pay | Admitting: Family Medicine

## 2023-01-01 DIAGNOSIS — I214 Non-ST elevation (NSTEMI) myocardial infarction: Secondary | ICD-10-CM | POA: Diagnosis not present

## 2023-01-30 DIAGNOSIS — F4320 Adjustment disorder, unspecified: Secondary | ICD-10-CM | POA: Diagnosis not present

## 2023-02-20 DIAGNOSIS — K080 Exfoliation of teeth due to systemic causes: Secondary | ICD-10-CM | POA: Diagnosis not present

## 2023-02-21 DIAGNOSIS — K080 Exfoliation of teeth due to systemic causes: Secondary | ICD-10-CM | POA: Diagnosis not present

## 2023-03-24 DIAGNOSIS — F4320 Adjustment disorder, unspecified: Secondary | ICD-10-CM | POA: Diagnosis not present

## 2023-03-26 ENCOUNTER — Encounter: Payer: Self-pay | Admitting: Family Medicine

## 2023-04-24 ENCOUNTER — Ambulatory Visit

## 2023-04-24 VITALS — Ht 65.0 in | Wt 122.0 lb

## 2023-04-24 DIAGNOSIS — Z0000 Encounter for general adult medical examination without abnormal findings: Secondary | ICD-10-CM

## 2023-04-24 NOTE — Patient Instructions (Signed)
  Sheena Craig , Thank you for taking time to come for your Medicare Wellness Visit. I appreciate your ongoing commitment to your health goals. Please review the following plan we discussed and let me know if I can assist you in the future.   These are the goals we discussed:  Goals      Patient Stated     Continue to exercise be active, hike, enjoy watching birds, being outside, and brighten my day with flowers. Spend time daily with God and stay socially active.     Patient Stated     She would like to continue a healthy lifestyle.         This is a list of the screening recommended for you and due dates:  Health Maintenance  Topic Date Due   Mammogram  05/25/2021   COVID-19 Vaccine (4 - 2024-25 season) 09/15/2022   Flu Shot  08/15/2023   Medicare Annual Wellness Visit  04/23/2024   DTaP/Tdap/Td vaccine (5 - Td or Tdap) 12/03/2028   Pneumonia Vaccine  Completed   DEXA scan (bone density measurement)  Completed   Zoster (Shingles) Vaccine  Completed   HPV Vaccine  Aged Out   Meningitis B Vaccine  Aged Out

## 2023-04-24 NOTE — Progress Notes (Signed)
 Subjective:   Sheena Craig is a 83 y.o. female who presents for Medicare Annual (Subsequent) preventive examination.  Visit Complete: Virtual I connected with  Sheena Craig on 04/24/23 by a audio enabled telemedicine application and verified that I am speaking with the correct person using two identifiers.  Patient Location: Home  Provider Location: Office/Clinic  I discussed the limitations of evaluation and management by telemedicine. The patient expressed understanding and agreed to proceed.  Vital Signs: Because this visit was a virtual/telehealth visit, some criteria may be missing or patient reported. Any vitals not documented were not able to be obtained and vitals that have been documented are patient reported.  Patient Medicare AWV questionnaire was completed by the patient on 04/20/2023; I have confirmed that all information answered by patient is correct and no changes since this date.  Cardiac Risk Factors include: advanced age (>68men, >55 women);dyslipidemia;family history of premature cardiovascular disease     Objective:    Today's Vitals   04/24/23 1300  Weight: 122 lb (55.3 kg)  Height: 5\' 5"  (1.651 m)   Body mass index is 20.3 kg/m.     04/24/2023    1:17 PM 09/12/2022    8:10 AM 12/11/2021    4:42 PM 11/05/2021    9:38 AM 02/26/2021    3:07 PM 02/22/2020    3:21 PM 03/19/2019    9:32 AM  Advanced Directives  Does Patient Have a Medical Advance Directive? Yes Yes No Yes Yes Yes Yes  Type of Advance Directive Living will Healthcare Power of [place];Living will    Healthcare Power of [place];Living will   Does patient want to make changes to medical advance directive? No - Patient declined   No - Patient declined No - Patient declined No - Patient declined   Copy of Healthcare Power of Attorney in Chart?  No - copy requested    No - copy requested   Would patient like information on creating a medical advance directive?   No - Patient  declined        Current Medications (verified) Outpatient Encounter Medications as of 04/24/2023  Medication Sig   AMBULATORY NON FORMULARY MEDICATION Calcium, Vitamin D, Magnesium and Zinc.   cyanocobalamin (VITAMIN B12) 1000 MCG/ML injection ADMINISTER 1 ML IN THE MUSCLE MONTHLY AS DIRECTED   dextromethorphan-guaiFENesin (MUCINEX DM) 30-600 MG per 12 hr tablet Take 1 tablet by mouth 2 (two) times daily as needed for cough.   docusate sodium (COLACE) 100 MG capsule Take 100 mg by mouth daily as needed for mild constipation.   levothyroxine (SYNTHROID) 100 MCG tablet Take 1 tablet (100 mcg total) by mouth daily.   NONFORMULARY OR COMPOUNDED ITEM 1 tablet daily.   NONFORMULARY OR COMPOUNDED ITEM 1 tablet daily.   polyethylene glycol powder (GLYCOLAX/MIRALAX) 17 GM/SCOOP powder Take 1 Container by mouth once.   rosuvastatin (CRESTOR) 20 MG tablet Take 1 tablet (20 mg total) by mouth daily.   SYRINGE-NEEDLE, DISP, 3 ML (B-D 3CC LUER-LOK SYR 25GX1") 25G X 1" 3 ML MISC USE FOR B12 SHOTS ONCE A MONTH   [DISCONTINUED] Calcium-Vitamin D-Vitamin K (CALCIUM SOFT CHEWS PO) Take 3 tablets by mouth daily.   [DISCONTINUED] clopidogrel (PLAVIX) 75 MG tablet Take 1 tablet (75 mg total) by mouth daily.   Facility-Administered Encounter Medications as of 04/24/2023  Medication   denosumab (PROLIA) injection 60 mg   denosumab (PROLIA) injection 60 mg    Allergies (verified) Adhesive [tape], Aspartame and phenylalanine, Cefdinir, Dimetapp children's cold-cough,  Pravastatin sodium, Other, and Pseudoephedrine   History: Past Medical History:  Diagnosis Date   ANEMIA-NOS 10/17/2006   Anxiety    DEPRESSION 10/17/2006   FIBROMYALGIA 02/09/2007   Fibromyalgia    GERD 10/17/2006   HYPERLIPIDEMIA 10/17/2006   HYPOTHYROIDISM 10/17/2006   LUMBAR RADICULOPATHY, LEFT 10/06/2009   MIGRAINE HEADACHE 10/17/2006   OSTEOARTHRITIS 10/17/2006   OSTEOPENIA 10/17/2006   Osteopenia 11/23/2014   OSTEOPOROSIS 02/09/2007    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   Sarcoidosis 10/17/2006   Past Surgical History:  Procedure Laterality Date   CATARACT EXTRACTION     bilateral   COLONOSCOPY     EXCISION METACARPAL MASS Right 07/24/2015   Procedure: EXCISION OF RIGHT LONG FINGER  MASS;  Surgeon: Mack Hook, MD;  Location: [facility];  Service: Orthopedics;  Laterality: Right;   LYMPH NODE BIOPSY  1968   PELVIC FRACTURE SURGERY  03-04-12    [facility]-surgery at [facility]   UPPER GASTROINTESTINAL ENDOSCOPY     Family History  Problem Relation Age of Onset   Hypertension Mother    Anxiety disorder Mother    Heart disease Mother    Rheum arthritis Mother    Alcohol abuse Father    Anxiety disorder Father    Heart disease Father    Rheum arthritis Father    Aneurysm Father    Skin cancer Father    Hypertension Father    Uterine cancer Sister    Heart disease Brother    Heart attack Brother    Breast cancer Maternal Aunt    Celiac disease Other        nieces and nephews   Colon cancer Neg Hx    Social History   Socioeconomic History   Marital status: Married    Spouse name: Not on file   Number of children: 2   Years of education: Not on file   Highest education level: Master's degree (e.g., MA, MS, MEng, MEd, MSW, MBA)  Occupational History   Occupation: psychology-counselor  Tobacco Use   Smoking status: Never    Passive exposure: Never   Smokeless tobacco: Never  Vaping Use   Vaping status: Never Used  Substance and Sexual Activity   Alcohol use: No   Drug use: No   Sexual activity: Yes    Partners: Male    Birth control/protection: Post-menopausal  Other Topics Concern   Not on file  Social History Narrative   Right handed.One story home. Lives with husband. Has two daughters. She likes to walk in the woods and watch birds. She sometimes does a little art.    Social Drivers of Corporate investment banker Strain: Low Risk  (04/24/2023)   Overall Financial Resource  Strain (CARDIA)    Difficulty of Paying Living Expenses: Not hard at all  Food Insecurity: No Food Insecurity (04/24/2023)   Hunger Vital Sign    Worried About Running Out of Food in the Last Year: Never true    Ran Out of Food in the Last Year: Never true  Transportation Needs: No Transportation Needs (04/24/2023)   PRAPARE - Administrator, Civil Service (Medical): No    Lack of Transportation (Non-Medical): No  Physical Activity: Sufficiently Active (04/24/2023)   Exercise Vital Sign    Days of Exercise per Week: 5 days    Minutes of Exercise per Session: 30 min  Stress: No Stress Concern Present (04/24/2023)   [employer] of Occupational Health - Occupational Stress Questionnaire  Feeling of Stress : Not at all  Social Connections: Socially Integrated (04/24/2023)   Social Connection and Isolation Panel [NHANES]    Frequency of Communication with Friends and Family: More than three times a week    Frequency of Social Gatherings with Friends and Family: Once a week    Attends Religious Services: More than 4 times per year    Active Member of [employer] or Organizations: Yes    Attends Engineer, structural: More than 4 times per year    Marital Status: Married    Tobacco Counseling Counseling given: Not Answered   Clinical Intake:  Pre-visit preparation completed: Yes  Pain : No/denies pain     BMI - recorded: 20.3 Nutritional Status: BMI of 19-24  Normal Nutritional Risks: None Diabetes: No  How often do you need to have someone help you when you read instructions, pamphlets, or other written materials from your doctor or pharmacy?: 1 - Never What is the last grade level you completed in school?: 18  Interpreter Needed?: No      Activities of Daily Living    04/24/2023    1:02 PM 04/20/2023    4:59 PM  In your present state of health, do you have any difficulty performing the following activities:  Hearing? 0 0  Vision? 0 0  Difficulty  concentrating or making decisions? 0 0  Walking or climbing stairs? 0 0  Dressing or bathing? 0 0  Doing errands, shopping? 0 0  Preparing Food and eating ? N N  Using the Toilet? N N  In the past six months, have you accidently leaked urine? N Y  Do you have problems with loss of bowel control? N N  Managing your Medications? N N  Managing your Finances? N N  Housekeeping or managing your Housekeeping? N N    Patient Care Team: Charlton Amor, DO as PCP - General (Family Medicine) Kathleene Hazel, MD as PCP - Cardiology (Cardiology) Arnetha Gula Lucious Groves, MD as Referring Physician (Ophthalmology) Glendale Chard, DO as Consulting Physician (Neurology)  Indicate any recent Medical Services you may have received from other than [facility] providers in the past year (date may be approximate).     Assessment:   This is a routine wellness examination for Sheena Craig.  Hearing/Vision screen No results found.   Goals Addressed             This Visit's Progress    Patient Stated       She would like to continue a healthy lifestyle.       Depression Screen    04/24/2023    1:17 PM 10/30/2022    2:07 PM 08/20/2022   11:00 AM 06/25/2022    1:55 PM 04/29/2022    2:35 PM 08/30/2021    9:14 AM 02/26/2021    3:38 PM  PHQ 2/9 Scores  PHQ - 2 Score 0 0 0 0 0 0 0  PHQ- 9 Score   2   0     Fall Risk    04/24/2023    1:18 PM 04/20/2023    4:59 PM 10/30/2022    2:07 PM 08/20/2022   11:00 AM 06/25/2022    1:54 PM  Fall Risk   Falls in the past year? 0 0 0 0 0  Number falls in past yr: 0 0 0 0 0  Injury with Fall? 0 0 0 0 0  Risk for fall due to : No Fall Risks  No  Fall Risks No Fall Risks No Fall Risks  Follow up Falls evaluation completed  Falls evaluation completed Falls evaluation completed Falls evaluation completed    MEDICARE RISK AT HOME: Medicare Risk at Home Any stairs in or around the home?: No If so, are there any without handrails?: No Home free of loose throw rugs  in walkways, pet beds, electrical cords, etc?: Yes Adequate lighting in your home to reduce risk of falls?: Yes Life alert?: No Use of a cane, walker or w/c?: Yes (She uses the cane when she has to stand a long time.) Grab bars in the bathroom?: Yes Shower chair or bench in shower?: No Elevated toilet seat or a handicapped toilet?: No  TIMED UP AND GO:  Was the test performed?  No    Cognitive Function:    07/03/2017    1:36 PM 05/31/2016    8:44 AM  MMSE - Mini Mental State Exam  Orientation to time 5 5  Orientation to Place 5 5  Registration 3 3  Attention/ Calculation 5 5  Recall 3 2  Language- name 2 objects 2 2  Language- repeat 1 1  Language- follow 3 step command 3 3  Language- read & follow direction 1 1  Write a sentence 1 1  Copy design 1 1  Total score 30 29        04/24/2023    1:19 PM  6CIT Screen  What Year? 0 points  What month? 0 points  What time? 0 points  Count back from 20 0 points  Months in reverse 0 points  Repeat phrase 2 points  Total Score 2 points    Immunizations Immunization History  Administered Date(s) Administered   Fluad Quad(high Dose 65+) 09/14/2018, 11/06/2020, 10/31/2021   Fluad Trivalent(High Dose 65+) 10/30/2022   H1N1 12/24/2007   Influenza Split 10/17/2010, 10/21/2011   Influenza Whole 10/09/2006, 10/01/2007, 10/06/2009   Influenza, High Dose Seasonal PF 09/24/2017   Influenza,inj,Quad PF,6+ Mos 09/29/2012, 10/01/2013, 11/11/2014, 09/16/2019   Influenza-Unspecified 10/19/2015   PFIZER(Purple Top)SARS-COV-2 Vaccination 02/04/2019, 02/25/2019, 10/27/2019   Pneumococcal Conjugate-13 09/29/2012   Pneumococcal Polysaccharide-23 09/22/2008   Td 09/22/2008   Td (Adult), 2 Lf Tetanus Toxid, Preservative Free 09/22/2008   Tdap 03/04/2012, 12/04/2018   Zoster Recombinant(Shingrix) 02/01/2018, 07/06/2018   Zoster, Live 07/10/2007, 10/01/2007    TDAP status: Up to date  Flu Vaccine status: Up to date  Pneumococcal vaccine  status: Up to date  Covid-19 vaccine status: Declined, Education has been provided regarding the importance of this vaccine but patient still declined. Advised may receive this vaccine at local pharmacy or Health Dept.or vaccine clinic. Aware to provide a copy of the vaccination record if obtained from local pharmacy or Health Dept. Verbalized acceptance and understanding.  Qualifies for Shingles Vaccine? Yes   Zostavax completed Yes   Shingrix Completed?: Yes  Screening Tests Health Maintenance  Topic Date Due   COVID-19 Vaccine (4 - 2024-25 season) 09/15/2022   INFLUENZA VACCINE  08/15/2023   Medicare Annual Wellness (AWV)  04/23/2024   DTaP/Tdap/Td (5 - Td or Tdap) 12/03/2028   Pneumonia Vaccine 20+ Years old  Completed   DEXA SCAN  Completed   Zoster Vaccines- Shingrix  Completed   HPV VACCINES  Aged Out   Meningococcal B Vaccine  Aged Out    Health Maintenance  Health Maintenance Due  Topic Date Due   COVID-19 Vaccine (4 - 2024-25 season) 09/15/2022    Colorectal cancer screening: No longer required.  Mammogram status: Ordered  . Pt provided with contact info and advised to call to schedule appt.  She has an appointment on June 30 th.   Bone Density status: Completed 09/07/2021. Results reflect: Bone density results: OSTEOPOROSIS. Repeat every 2 years.  Lung Cancer Screening: (Low Dose CT Chest recommended if Age 72-80 years, 20 pack-year currently smoking OR have quit w/in 15years.) does not qualify.   Lung Cancer Screening Referral: n/a  Additional Screening:  Hepatitis C Screening: does not qualify; Completed   Vision Screening: Recommended annual ophthalmology exams for early detection of glaucoma and other disorders of the eye. Is the patient up to date with their annual eye exam?  Yes  Who is the provider or what is the name of the office in which the patient attends annual eye exams? [facility]/Dr Radionchenko If pt is not established with a provider,  would they like to be referred to a provider to establish care?  N/a .   Dental Screening: Recommended annual dental exams for proper oral hygiene    Community Resource Referral / Chronic Care Management: CRR required this visit?  No   CCM required this visit?  No     Plan:     I have personally reviewed and noted the following in the patient's chart:   Medical and social history Use of alcohol, tobacco or illicit drugs  Current medications and supplements including opioid prescriptions. Patient is not currently taking opioid prescriptions. Functional ability and status Nutritional status Physical activity Advanced directives List of other physicians Hospitalizations x 2, surgeries, and ER visits in previous 12 months  Vitals Screenings to include cognitive, depression, and falls Referrals and appointments  In addition, I have reviewed and discussed with patient certain preventive protocols, quality metrics, and best practice recommendations. A written personalized care plan for preventive services as well as general preventive health recommendations were provided to patient.     Esmond Harps, CMA   04/24/2023   After Visit Summary: (MyChart) Due to this being a telephonic visit, the after visit summary with patients personalized plan was offered to patient via MyChart   Nurse Notes:   Sheena Craig is a 83 y.o. y.o. female patient of Stephens Shire, DO who had a Medicare Annual Wellness Visit today via telephone. She reports that he is socially active and does interact with friends/family regularly. She is moderately physically active. She likes to take walks in the woods.

## 2023-04-25 ENCOUNTER — Ambulatory Visit (HOSPITAL_COMMUNITY): Payer: Medicare Other | Attending: Internal Medicine

## 2023-04-25 DIAGNOSIS — I340 Nonrheumatic mitral (valve) insufficiency: Secondary | ICD-10-CM | POA: Insufficient documentation

## 2023-04-25 LAB — ECHOCARDIOGRAM COMPLETE
Area-P 1/2: 3.72 cm2
S' Lateral: 2.2 cm

## 2023-05-02 ENCOUNTER — Other Ambulatory Visit: Payer: Self-pay | Admitting: Family Medicine

## 2023-05-02 DIAGNOSIS — K080 Exfoliation of teeth due to systemic causes: Secondary | ICD-10-CM | POA: Diagnosis not present

## 2023-05-06 DIAGNOSIS — K080 Exfoliation of teeth due to systemic causes: Secondary | ICD-10-CM | POA: Diagnosis not present

## 2023-05-16 ENCOUNTER — Other Ambulatory Visit (HOSPITAL_COMMUNITY): Payer: Self-pay

## 2023-05-16 ENCOUNTER — Telehealth: Payer: Self-pay

## 2023-05-16 NOTE — Telephone Encounter (Signed)
 Sheena Craig

## 2023-05-16 NOTE — Telephone Encounter (Signed)
 Prolia VOB initiated via [email or web address]

## 2023-05-16 NOTE — Telephone Encounter (Signed)
 No PA required for buy and bill  PA for pharmacy benefit submitted via CMM. Key: [id number]

## 2023-05-19 ENCOUNTER — Other Ambulatory Visit (HOSPITAL_COMMUNITY): Payer: Self-pay

## 2023-05-19 NOTE — Telephone Encounter (Signed)
 Pt ready for scheduling for PROLIA  on or after : 05/19/23  Option# 1: Buy/Bill (Office supplied medication)  Out-of-pocket cost due at time of clinic visit: $332  Number of injection/visits approved: ---  Primary: BC[employer]-MEDICARE Prolia  co-insurance: 20% Admin fee co-insurance: 0%  Secondary: --- Prolia  co-insurance:  Admin fee co-insurance:   Medical Benefit Details: Date Benefits were checked: 05/16/23 Deductible: NO/ Coinsurance: 20%/ Admin Fee: 0%  Prior Auth: N/A PA#  Expiration Date:   # of doses approved ----------------------------------------------------------------------- Option# 2- Med Obtained from pharmacy:  Pharmacy benefit: Copay $1073.46 (Paid to pharmacy) Admin Fee: 0% (Pay at clinic)  Prior Auth: APPROVED PA# [id number] Expiration Date: 05/16/23-05/15/24  # of doses approved: :---   If patient wants fill through the pharmacy benefit please send prescription to:  [facility] , and include estimated need by date in rx notes. Pharmacy will ship medication directly to the office.  Patient NOT eligible for Prolia  Copay Card. Copay Card can make patient's cost as little as $25. Link to apply: [email or web address]://[email or web address].[email or web address]  ** This summary of benefits is an estimation of the patient's out-of-pocket cost. Exact cost may very based on individual plan coverage.

## 2023-05-22 ENCOUNTER — Encounter: Payer: Self-pay | Admitting: Family Medicine

## 2023-05-22 DIAGNOSIS — F4320 Adjustment disorder, unspecified: Secondary | ICD-10-CM | POA: Diagnosis not present

## 2023-06-05 DIAGNOSIS — H35363 Drusen (degenerative) of macula, bilateral: Secondary | ICD-10-CM | POA: Diagnosis not present

## 2023-06-05 DIAGNOSIS — Z961 Presence of intraocular lens: Secondary | ICD-10-CM | POA: Diagnosis not present

## 2023-06-05 DIAGNOSIS — H16223 Keratoconjunctivitis sicca, not specified as Sjogren's, bilateral: Secondary | ICD-10-CM | POA: Diagnosis not present

## 2023-06-05 DIAGNOSIS — H18452 Nodular corneal degeneration, left eye: Secondary | ICD-10-CM | POA: Diagnosis not present

## 2023-06-13 ENCOUNTER — Encounter: Payer: Self-pay | Admitting: Cardiovascular Disease

## 2023-06-13 ENCOUNTER — Ambulatory Visit: Payer: Medicare Other | Admitting: Family Medicine

## 2023-06-13 ENCOUNTER — Ambulatory Visit (INDEPENDENT_AMBULATORY_CARE_PROVIDER_SITE_OTHER): Admitting: Family Medicine

## 2023-06-13 ENCOUNTER — Encounter: Payer: Self-pay | Admitting: Family Medicine

## 2023-06-13 VITALS — BP 130/65 | HR 77 | Temp 98.4°F | Ht 65.0 in | Wt 119.3 lb

## 2023-06-13 DIAGNOSIS — G4762 Sleep related leg cramps: Secondary | ICD-10-CM | POA: Diagnosis not present

## 2023-06-13 DIAGNOSIS — E538 Deficiency of other specified B group vitamins: Secondary | ICD-10-CM | POA: Diagnosis not present

## 2023-06-13 DIAGNOSIS — R030 Elevated blood-pressure reading, without diagnosis of hypertension: Secondary | ICD-10-CM | POA: Diagnosis not present

## 2023-06-13 DIAGNOSIS — M797 Fibromyalgia: Secondary | ICD-10-CM | POA: Insufficient documentation

## 2023-06-13 MED ORDER — FLUOXETINE HCL 10 MG PO CAPS: 10.0000 mg | ORAL_CAPSULE | Freq: Every day | ORAL | 1 refills | Status: DC

## 2023-06-13 MED ORDER — BD LUER-LOK SYRINGE 25G X 1" 3 ML MISC
1 refills | Status: DC
Start: 2023-06-13 — End: 2023-09-19

## 2023-06-13 NOTE — Assessment & Plan Note (Signed)
 Treated with Prozac  10 mg in the past. Went off due to blood thinner after MI. Wishes to restart Prozac  10 mg today. Follow-up in 3 months with new provider at [facility].

## 2023-06-13 NOTE — Progress Notes (Signed)
 Established Patient Office Visit  Subjective   Patient ID: Sheena Craig, female    DOB: December 07, 1940  Age: 83 y.o. MRN: [id number]  Chief Complaint  Patient presents with   Follow-up    HPI  Nocturnal HTN:  5/28: at 0200 in the morning: blood pressure was 154/77,  151/85.  After having MI she was started on metoprolol  and it caused her to have low blood pressure during the day.  No chest pain or shortness of breath.  Heart rate 70-80's  Off of metoprolol  now.  Eating causes blood pressure to go down.  Continues to see cards, recommend follow-up with cards. Well controlled upon recheck office.   Depression/fibromyalgia: went off fluoxetine , wants to restart at 10 mg. Went off due to being on blood thinner.    Pernicious anemia: taked B12 injections at home, needs syringes reordered.  Declines need for lab today.   Leg cramps: toes, feets, thighs, and calfs. On statin. Worse with increased dose of statin after MI. Wonders if this is causing this. Recommend discussing with cards.    ROS    Objective:     BP 130/65 (BP Location: Right Arm, Patient Position: Sitting, Cuff Size: Normal)   Pulse 77   Temp 98.4 F (36.9 C) (Oral)   Ht 5\' 5"  (1.651 m)   Wt 119 lb 4.8 oz (54.1 kg)   SpO2 96%   BMI 19.85 kg/m  BP Readings from Last 3 Encounters:  06/13/23 130/65  12/07/22 126/68  11/15/22 (!) 105/47      Physical Exam Vitals and nursing note reviewed.  Constitutional:      Appearance: Normal appearance. She is normal weight.  Cardiovascular:     Rate and Rhythm: Normal rate.     Heart sounds: Normal heart sounds.  Pulmonary:     Effort: Pulmonary effort is normal.     Breath sounds: Normal breath sounds.  Skin:    General: Skin is warm and dry.  Neurological:     General: No focal deficit present.     Mental Status: She is alert. Mental status is at baseline.  Psychiatric:        Mood and Affect: Mood normal.        Behavior: Behavior normal.         Thought Content: Thought content normal.        Judgment: Judgment normal.     No results found for any visits on 06/13/23.    The ASCVD Risk score (Arnett DK, et al., 2019) failed to calculate for the following reasons:   The 2019 ASCVD risk score is only valid for ages 29 to 18   Risk score cannot be calculated because patient has a medical history suggesting prior/existing ASCVD    Assessment & Plan:   Problem List Items Addressed This Visit     B12 deficiency   Self-administers vitamin B 12 at home. Declines need for lab today. Syringes reordered per request.        Relevant Medications   SYRINGE-NEEDLE, DISP, 3 ML (B-D 3CC LUER-LOK SYR 25GX1") 25G X 1" 3 ML MISC   Fibromyalgia - Primary   Treated with Prozac  10 mg in the past. Went off due to blood thinner after MI. Wishes to restart Prozac  10 mg today. Follow-up in 3 months with new provider at [facility].       Relevant Medications   FLUoxetine  (PROZAC ) 10 MG capsule   Elevated blood pressure reading in office without diagnosis  of hypertension   Gets up at night to eat. Monitors blood pressure at that time. 150's/70-80's during the night. Usually has  normal to low blood pressure during the day. Prescribed metoprolol  after MI, stopped due to low blood pressure.  Well controlled in the office. Recommend that she discuss this with her cardiologist.       Nocturnal leg cramps   Wonders if leg cramps are related to increased dose of rosuvastatin . Symptoms have worsened with this increase.  Due to history of NSTEMI, will not adjust dose.  She will discuss this with cardiology.      Agrees with plan of care discussed.  Questions answered.   Return in about 3 months (around 09/13/2023) for medication management for GAD/fibromyalgia with PA .    Mickiel Albany, FNP

## 2023-06-13 NOTE — Assessment & Plan Note (Addendum)
 Wonders if leg cramps are related to increased dose of rosuvastatin . Symptoms have worsened with this increase.  Due to history of NSTEMI, will not adjust dose.  She will discuss this with cardiology.

## 2023-06-13 NOTE — Assessment & Plan Note (Signed)
 Gets up at night to eat. Monitors blood pressure at that time. 150's/70-80's during the night. Usually has  normal to low blood pressure during the day. Prescribed metoprolol  after MI, stopped due to low blood pressure.  Well controlled in the office. Recommend that she discuss this with her cardiologist.

## 2023-06-13 NOTE — Assessment & Plan Note (Signed)
 Self-administers vitamin B 12 at home. Declines need for lab today. Syringes reordered per request.

## 2023-06-16 MED ORDER — ROSUVASTATIN CALCIUM 10 MG PO TABS: 10.0000 mg | ORAL_TABLET | Freq: Every day | ORAL | 3 refills | Status: DC

## 2023-08-04 ENCOUNTER — Ambulatory Visit (INDEPENDENT_AMBULATORY_CARE_PROVIDER_SITE_OTHER): Admitting: Urgent Care

## 2023-08-04 VITALS — BP 125/82 | HR 74 | Resp 17 | Ht 65.0 in | Wt 119.8 lb

## 2023-08-04 DIAGNOSIS — E039 Hypothyroidism, unspecified: Secondary | ICD-10-CM | POA: Diagnosis not present

## 2023-08-04 DIAGNOSIS — E871 Hypo-osmolality and hyponatremia: Secondary | ICD-10-CM

## 2023-08-04 DIAGNOSIS — R790 Abnormal level of blood mineral: Secondary | ICD-10-CM | POA: Diagnosis not present

## 2023-08-04 DIAGNOSIS — E538 Deficiency of other specified B group vitamins: Secondary | ICD-10-CM | POA: Diagnosis not present

## 2023-08-04 DIAGNOSIS — R252 Cramp and spasm: Secondary | ICD-10-CM

## 2023-08-04 NOTE — Patient Instructions (Addendum)
 We drew labs to ensure that there is not a metabolic component to your leg cramps. I sent your cardiologist a message to see if we can change your crestor  to repatha. I will notify you once we receive notification.  Please also ensure that you are hydrated with WATER. Dehydration can worsen muscle cramps. Hot tea, sweet tea, sodas, all all diuretics which can further dehydrate. Try to drink 2 liters of water daily.   Please schedule your routine annual physical in 9-12 weeks.

## 2023-08-04 NOTE — Progress Notes (Unsigned)
   Established Patient Office Visit  Subjective:  Patient ID: Sheena Craig, female    DOB: 1940/05/03  Age: 83 y.o. MRN: [id number]  Chief Complaint  Patient presents with   Transitions Of Care    Pt has also had muscle cramps that starts in her upper legs and radiates all the way dwn to her feet     HPI  {History (Optional):23778}  ROS: as noted in HPI  Objective:     BP 125/82 (BP Location: Left Arm, Patient Position: Sitting, Cuff Size: Normal)   Pulse 74   Resp 17   Ht 5' 5 (1.651 m)   Wt 119 lb 12 oz (54.3 kg)   SpO2 95%   BMI 19.93 kg/m  {Vitals History (Optional):23777}  Physical Exam   No results found for any visits on 08/04/23.  {Labs (Optional):23779}  The ASCVD Risk score (Arnett DK, et al., 2019) failed to calculate for the following reasons:   The 2019 ASCVD risk score is only valid for ages 9 to 22   Risk score cannot be calculated because patient has a medical history suggesting prior/existing ASCVD  Assessment & Plan:  There are no diagnoses linked to this encounter.   No follow-ups on file.   Benton LITTIE Gave, PA

## 2023-08-05 ENCOUNTER — Encounter: Payer: Self-pay | Admitting: Urgent Care

## 2023-08-05 DIAGNOSIS — Z1231 Encounter for screening mammogram for malignant neoplasm of breast: Secondary | ICD-10-CM | POA: Diagnosis not present

## 2023-08-05 DIAGNOSIS — Z01419 Encounter for gynecological examination (general) (routine) without abnormal findings: Secondary | ICD-10-CM | POA: Diagnosis not present

## 2023-08-05 LAB — CMP14+EGFR
ALT: 20 IU/L (ref 0–32)
AST: 20 IU/L (ref 0–40)
Albumin: 4.3 g/dL (ref 3.7–4.7)
Alkaline Phosphatase: 55 IU/L (ref 44–121)
BUN/Creatinine Ratio: 27 (ref 12–28)
BUN: 19 mg/dL (ref 8–27)
Bilirubin Total: 0.3 mg/dL (ref 0.0–1.2)
CO2: 22 mmol/L (ref 20–29)
Calcium: 10 mg/dL (ref 8.7–10.3)
Chloride: 96 mmol/L (ref 96–106)
Creatinine, Ser: 0.71 mg/dL (ref 0.57–1.00)
Globulin, Total: 2.2 g/dL (ref 1.5–4.5)
Glucose: 87 mg/dL (ref 70–99)
Potassium: 4.9 mmol/L (ref 3.5–5.2)
Sodium: 132 mmol/L — ABNORMAL LOW (ref 134–144)
Total Protein: 6.5 g/dL (ref 6.0–8.5)
eGFR: 85 mL/min/1.73 (ref >59)

## 2023-08-05 LAB — CBC WITH DIFFERENTIAL/PLATELET
Basophils Absolute: 0 x10E3/uL (ref 0.0–0.2)
Basos: 1 %
EOS (ABSOLUTE): 0.2 x10E3/uL (ref 0.0–0.4)
Eos: 3 %
Hematocrit: 38.2 % (ref 34.0–46.6)
Hemoglobin: 12.5 g/dL (ref 11.1–15.9)
Immature Grans (Abs): 0 x10E3/uL (ref 0.0–0.1)
Immature Granulocytes: 0 %
Lymphocytes Absolute: 1.9 x10E3/uL (ref 0.7–3.1)
Lymphs: 22 %
MCH: 30.9 pg (ref 26.6–33.0)
MCHC: 32.7 g/dL (ref 31.5–35.7)
MCV: 94 fL (ref 79–97)
Monocytes Absolute: 0.8 x10E3/uL (ref 0.1–0.9)
Monocytes: 9 %
Neutrophils Absolute: 5.5 x10E3/uL (ref 1.4–7.0)
Neutrophils: 65 %
Platelets: 399 x10E3/uL (ref 150–450)
RBC: 4.05 x10E6/uL (ref 3.77–5.28)
RDW: 12.1 % (ref 11.7–15.4)
WBC: 8.4 x10E3/uL (ref 3.4–10.8)

## 2023-08-05 LAB — B12 AND FOLATE PANEL
Folate: >20 ng/mL (ref >3.0)
Vitamin B-12: 440 pg/mL (ref 232–1245)

## 2023-08-05 LAB — TSH+FREE T4
Free T4: 1.69 ng/dL (ref 0.82–1.77)
TSH: 0.935 u[IU]/mL (ref 0.450–4.500)

## 2023-08-05 LAB — PHOSPHORUS: Phosphorus: 4.1 mg/dL (ref 3.0–4.3)

## 2023-08-05 LAB — MAGNESIUM: Magnesium: 2.2 mg/dL (ref 1.6–2.3)

## 2023-08-07 ENCOUNTER — Encounter: Payer: Self-pay | Admitting: Urgent Care

## 2023-08-07 DIAGNOSIS — I252 Old myocardial infarction: Secondary | ICD-10-CM

## 2023-08-07 DIAGNOSIS — F4320 Adjustment disorder, unspecified: Secondary | ICD-10-CM | POA: Diagnosis not present

## 2023-08-07 DIAGNOSIS — R252 Cramp and spasm: Secondary | ICD-10-CM

## 2023-08-07 DIAGNOSIS — E782 Mixed hyperlipidemia: Secondary | ICD-10-CM

## 2023-08-11 ENCOUNTER — Ambulatory Visit: Payer: Self-pay | Admitting: Urgent Care

## 2023-08-11 MED ORDER — REPATHA SURECLICK 140 MG/ML ~~LOC~~ SOAJ: 140.0000 mg | SUBCUTANEOUS | 3 refills | Status: DC

## 2023-08-19 ENCOUNTER — Telehealth: Payer: Self-pay

## 2023-08-19 ENCOUNTER — Other Ambulatory Visit (HOSPITAL_COMMUNITY): Payer: Self-pay

## 2023-08-19 NOTE — Telephone Encounter (Signed)
 Pharmacy Patient Advocate Encounter   Received notification from Patient Pharmacy that prior authorization for Repatha  SureClick 140mg /ml is required/requested.   Insurance verification completed.   The patient is insured through [employer] .   Per test claim: PA required; PA submitted to above mentioned insurance via CoverMyMeds Key/confirmation #/EOC [id number] Status is pending

## 2023-08-20 ENCOUNTER — Telehealth: Payer: Self-pay

## 2023-08-20 ENCOUNTER — Other Ambulatory Visit (HOSPITAL_COMMUNITY): Payer: Self-pay

## 2023-08-20 NOTE — Telephone Encounter (Signed)
 Copied from CRM #[id number]. Topic: Clinical - Medication Prior Auth >> Aug 20, 2023  8:42 AM Zane F wrote: Reason for CRM:   Caller: Terri  Calling From: [facility]  Medication In Question:  Repatha  SureClick 140mg /ml  Calling to inform the office that the prescription requested for a prior authorization has been APPROVED. Follow up fax confirmation will be sent over. No additional information is required at this time.

## 2023-08-20 NOTE — Telephone Encounter (Signed)
 Pharmacy Patient Advocate Encounter  Received notification from [facility] that Prior Authorization for Repatha  SureClick 140mg /ml  has been APPROVED from 08/19/23 to 08/18/24. Ran test claim, Copay is $420.00. This test claim was processed through [facility]- copay amounts may vary at other pharmacies due to pharmacy/plan contracts, or as the patient moves through the different stages of their insurance plan.   PA #/Case ID/Reference #: [id number]

## 2023-08-21 ENCOUNTER — Encounter: Payer: Self-pay | Admitting: Urgent Care

## 2023-08-21 DIAGNOSIS — F4320 Adjustment disorder, unspecified: Secondary | ICD-10-CM | POA: Diagnosis not present

## 2023-08-22 ENCOUNTER — Other Ambulatory Visit: Payer: Self-pay | Admitting: Urgent Care

## 2023-08-22 DIAGNOSIS — I252 Old myocardial infarction: Secondary | ICD-10-CM

## 2023-08-22 DIAGNOSIS — E782 Mixed hyperlipidemia: Secondary | ICD-10-CM

## 2023-08-22 NOTE — Progress Notes (Signed)
 Care management referral placed to pharmacy for assistance on repatha  cost.

## 2023-09-09 ENCOUNTER — Telehealth: Payer: Self-pay | Admitting: *Deleted

## 2023-09-09 NOTE — Progress Notes (Unsigned)
 Care Guide Pharmacy Note  09/09/2023 Name: PAISLY FINGERHUT MRN: [id number] DOB: 1940/09/13  Referred By: Lowella Benton CROME, PA Reason for referral: Call Attempt #1 and Complex Care Management (Outreach to schedule referral with pharmacist )   Avelina SHAUNNA Natal is a 83 y.o. year old female who is a primary care patient of Crain, Whitney L, [place].  Avelina SHAUNNA Natal was referred to the pharmacist for assistance related to: HLD  An unsuccessful telephone outreach was attempted today to contact the patient who was referred to the pharmacy team for assistance with medication management. Additional attempts will be made to contact the patient.  Thedford Franks, CMA [place]  [facility], [facility] Guide Direct Dial: [phone]  Fax: [phone] Website: [email or web address]

## 2023-09-10 NOTE — Progress Notes (Signed)
 Care Guide Pharmacy Note  09/10/2023 Name: KANDI BRUSSEAU MRN: [id number] DOB: [birth year]  Referred By: Lowella Benton CROME, PA Reason for referral: Call Attempt #1 and Complex Care Management (Outreach to schedule referral with pharmacist )   Avelina SHAUNNA Natal is a 83 y.o. year old female who is a primary care patient of Crain, Whitney L, [place].  Avelina SHAUNNA Natal was referred to the pharmacist for assistance related to: HLD  Successful contact was made with the patient to discuss pharmacy services including being ready for the pharmacist to call at least 5 minutes before the scheduled appointment time and to have medication bottles and any blood pressure readings ready for review. The patient agreed to meet with the pharmacist via telephone visit on 09/19/2023  Thedford Franks, CMA [place]  [facility], [facility] Guide Direct Dial: [phone]  Fax: [phone] Website: [email or web address]

## 2023-09-11 DIAGNOSIS — F4320 Adjustment disorder, unspecified: Secondary | ICD-10-CM | POA: Diagnosis not present

## 2023-09-12 ENCOUNTER — Other Ambulatory Visit: Payer: Self-pay

## 2023-09-12 ENCOUNTER — Encounter: Admitting: Urgent Care

## 2023-09-12 ENCOUNTER — Ambulatory Visit
Admission: RE | Admit: 2023-09-12 | Discharge: 2023-09-12 | Disposition: A | Discharge status: Home / Self Care | Attending: Family Medicine | Admitting: Family Medicine

## 2023-09-12 VITALS — BP 125/77 | HR 71 | Temp 97.9°F | Resp 16

## 2023-09-12 DIAGNOSIS — L03116 Cellulitis of left lower limb: Secondary | ICD-10-CM

## 2023-09-12 MED ORDER — DOXYCYCLINE HYCLATE 100 MG PO CAPS
ORAL_CAPSULE | ORAL | 0 refills | Status: DC
Start: 2023-09-12 — End: 2023-09-16

## 2023-09-12 NOTE — ED Triage Notes (Signed)
 Noticed Thursday morning insect bite on inside of left thigh. Has redness to site. Thinks it could be a tick bite. Area is slightly itchy but not very much. No pain. Used alcohol and washed it.
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
## 2023-09-12 NOTE — Discharge Instructions (Signed)
 May apply 1% hydrocortisone cream once or twice daily as needed for itching.  If symptoms become significantly worse during the night or over the weekend, proceed to the local emergency room.

## 2023-09-15 ENCOUNTER — Encounter: Payer: Self-pay | Admitting: Urgent Care

## 2023-09-15 LAB — SPOTTED FEVER GROUP ANTIBODIES
Spotted Fever Group IgG: <1:64 {titer}
Spotted Fever Group IgM: <1:64 {titer}

## 2023-09-16 ENCOUNTER — Ambulatory Visit: Payer: Self-pay

## 2023-09-16 ENCOUNTER — Ambulatory Visit (INDEPENDENT_AMBULATORY_CARE_PROVIDER_SITE_OTHER): Admitting: Urgent Care

## 2023-09-16 ENCOUNTER — Encounter: Payer: Self-pay | Admitting: Urgent Care

## 2023-09-16 VITALS — BP 108/63 | HR 81 | Ht 65.0 in | Wt 119.0 lb

## 2023-09-16 DIAGNOSIS — B882 Other arthropod infestations: Secondary | ICD-10-CM | POA: Diagnosis not present

## 2023-09-16 DIAGNOSIS — L237 Allergic contact dermatitis due to plants, except food: Secondary | ICD-10-CM | POA: Diagnosis not present

## 2023-09-16 MED ORDER — PREDNISONE 10 MG PO TABS: ORAL_TABLET | ORAL | 0 refills | Status: AC

## 2023-09-16 MED ORDER — DOXYCYCLINE HYCLATE 100 MG PO CAPS: 100.0000 mg | ORAL_CAPSULE | Freq: Two times a day (BID) | ORAL | 0 refills | Status: AC

## 2023-09-16 NOTE — Telephone Encounter (Signed)
 FYI Only or Action Required?: FYI only for provider.  Patient was last seen in primary care on 08/04/2023 by Lowella Benton CROME, PA.  Called Nurse Triage reporting Rash.  Symptoms began several days ago.  Interventions attempted: OTC medications: hydrocortisone cream.  Symptoms are: gradually worsening.  Triage Disposition: See Physician Within 24 Hours  Patient/caregiver understands and will follow disposition?: Yes     Copied from CRM [id number]. Topic: Clinical - Red Word Triage >> Sep 16, 2023  8:56 AM Sheena Craig wrote: Red Word that prompted transfer to Nurse Triage: Patient states she was at urgent care on Friday due to what they told her was a tick bite. States she now has a rash and had a lyme test that is questionable. Patient states rash is above her navel just under her breast. Now it is also in her groin and is coming on her left wrist and forearm. Patient states the diagnosis from the urgent care was cellulitis from a tick bite. Reason for Disposition  SEVERE itching (i.e., interferes with sleep, normal activities or school)  Answer Assessment - Initial Assessment Questions Patient states [place] was [place] trail on [place]. Patient states her BP is 166/100 and that is abnormal for her.   1. APPEARANCE of RASH: What does the rash look like? (e.g., blisters, dry flaky skin, red spots, redness, sores)     Patient states it has tiny blisters, some are bigger than others. Oldest ones are very dark pink. Newer rash spots are lighter and not raised. Denies bullseye appearance.  2. SIZE: How big are the spots? (e.g., tip of pen, eraser, coin; inches, centimeters)     Smaller ones are like a pen head, larger spots are size of green pea.  3. LOCATION: Where is the rash located?     Rash is above naval, under breasts, groin area, and starting on arms. 4. COLOR: What color is the rash? (Note: It is difficult to assess rash color in people with darker-colored skin. When this situation  occurs, simply ask the caller to describe what they see.)     Light pink to dark pink.  5. ONSET: When did the rash begin?     Friday - she noticed bite area and was diagnosed with cellulitis and urgent care.  6. FEVER: Do you have a fever? If Yes, ask: What is your temperature, how was it measured, and when did it start?     Patient states temperature was 99 yesterday evening.  7. ITCHING: Does the rash itch? If Yes, ask: How bad is the itch? (Scale 1-10; or mild, moderate, severe)     Very itchy - has been using OTC cortisone cream 8. CAUSE: What do you think is causing the rash?     Urgent care stated it was a tick bite rash 9. MEDICINE FACTORS: Have you started any new medicines within the last 2 weeks? (e.g., antibiotics)      At [facility], doctor gave Doxycycline  10. OTHER SYMPTOMS: Do you have any other symptoms? (e.g., dizziness, headache, sore throat, joint pain)       Denies swelling, dizziness, headaches  Protocols used: Rash or Redness - [facility]

## 2023-09-16 NOTE — Telephone Encounter (Signed)
 Patient scheduled for today

## 2023-09-16 NOTE — Patient Instructions (Signed)
 Please take doxycycline  twice daily for a total of 10 days. I called in an additional three days for you today.  Take the prednisone  daily per taper pack directions.

## 2023-09-16 NOTE — Progress Notes (Signed)
 Established Patient Office Visit  Subjective:  Patient ID: Sheena Craig, female    DOB: 1940/06/16  Age: 83 y.o. MRN: [id number]  Chief Complaint  Patient presents with   Rash    Itchy, red, raised, all over    Rash    Discussed the use of AI scribe software for clinical note transcription with the patient, who gave verbal consent to proceed.  History of Present Illness   Sheena Craig is an 83 year old female who presents with a vesicular rash and concerns about a tick bite.  She visited urgent care on August 29th due to a bite diagnosed as cellulitis and was prescribed doxycycline  100 mg twice daily for seven days, with three days remaining. She is concerned about a rash that developed after starting the medication and wonders if it is related to the doxycycline  or a tick-borne illness.  However, she has developed a vesicular rash under her breast and on her arms, which is itchy but not painful. The rash started on Sunday evening, initially thought to be caused by the elastic of her slip. She was in the woods on the Tuesday before the rash appeared, raising concerns about contact dermatitis from a poisonous plant.  No systemic symptoms such as throat swelling, pain with swallowing, wheezing, or chest tightness. She has a history of using prednisone  and tolerates it well.  She is also managing foot cramps and has tried an alternative cholesterol medication with minimal improvement. She is concerned about Prolia  due to a friend's adverse experience with it.       Patient Active Problem List   Diagnosis Date Noted   Fibromyalgia 06/13/2023   Elevated blood pressure reading in office without diagnosis of hypertension 06/13/2023   Nocturnal leg cramps 06/13/2023   Hospital discharge follow-up 06/25/2022   GAD (generalized anxiety disorder) 06/25/2022   Weight loss 04/29/2022   Constipation 08/30/2021   Tremor 08/30/2021   Lumbar radiculopathy, right  05/05/2021   Encounter for well adult exam with abnormal findings 08/29/2020   Vitamin D  deficiency 08/29/2020   Abnormal urine odor 08/24/2020   B12 deficiency 02/26/2020   Palpitations 09/16/2019   HLD (hyperlipidemia) 12/04/2018   Left groin pain 11/30/2015   Dysphagia 11/19/2015   Insect bites 11/19/2015   Sacral back pain 11/19/2015   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   Blepharitis of both upper and lower eyelid of left eye 04/21/2015   Blepharitis of both upper and lower eyelid of right eye 04/21/2015   Degenerative drusen of both eyes 04/21/2015   Keratoconjunctivitis sicca of both eyes not specified as Sjogren's 04/21/2015   Pseudophakia of both eyes 04/21/2015   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   Dizziness 11/11/2014   Ganglion cyst of flexor tendon sheath of finger of right hand 06/18/2014   Bronchiectasis without acute exacerbation (HCC) 06/30/2012   Cough 04/09/2012   Heart murmur 03/25/2012   Abnormal CT scan, chest 03/25/2012   Peripheral edema 03/25/2012   Liver lesion 03/25/2012   Pancreatic lesion 03/25/2012   Coronary artery calcification seen on CAT scan 03/25/2012   Degenerative disc disease, cervical 03/25/2012   Degenerative disc disease, lumbar 03/25/2012   Syncope 03/13/2012   Pain 03/04/2012   Respiratory insufficiency following shock, trauma, or surgery 03/04/2012   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   Hypothyroidism 10/21/2011   Preventative health care 10/15/2010   LUMBAR RADICULOPATHY, LEFT 10/06/2009   FIBROMYALGIA 02/09/2007   Osteoporosis 02/09/2007   Sarcoidosis 10/17/2006  Anemia, iron deficiency 10/17/2006   Depression 10/17/2006   Migraine headache 10/17/2006   GERD 10/17/2006   Osteoarthritis 10/17/2006   Past Medical History:  Diagnosis Date   ANEMIA-NOS 10/17/2006   Anxiety    Cataract 3/08   Both eyes   DEPRESSION 10/17/2006   FIBROMYALGIA 02/09/2007   Fibromyalgia    GERD 10/17/2006   Heart  murmur    HYPERLIPIDEMIA 10/17/2006   HYPOTHYROIDISM 10/17/2006   LUMBAR RADICULOPATHY, LEFT 10/06/2009   MIGRAINE HEADACHE 10/17/2006   Myocardial infarction (HCC) 6/24   Mild   OSTEOARTHRITIS 10/17/2006   OSTEOPENIA 10/17/2006   Osteopenia 11/23/2014   OSTEOPOROSIS 02/09/2007   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   Sarcoidosis 10/17/2006   Past Surgical History:  Procedure Laterality Date   CATARACT EXTRACTION     bilateral   COLONOSCOPY     EXCISION METACARPAL MASS Right 07/24/2015   Procedure: EXCISION OF RIGHT LONG FINGER  MASS;  Surgeon: Alm Hummer, MD;  Location: [facility];  Service: Orthopedics;  Laterality: Right;   EYE SURGERY     Cataracts   FRACTURE SURGERY  2/14   Pelvis   LYMPH NODE BIOPSY  01/14/1966   PELVIC FRACTURE SURGERY  03/04/2012    [facility]-surgery at [facility]   UPPER GASTROINTESTINAL ENDOSCOPY     Social History   Tobacco Use   Smoking status: Never    Passive exposure: Never   Smokeless tobacco: Never  Vaping Use   Vaping status: Never Used  Substance Use Topics   Alcohol use: No   Drug use: No      ROS: as noted in HPI  Objective:     BP 108/63   Pulse 81   Ht 5' 5 (1.651 m)   Wt 119 lb (54 kg)   SpO2 96%   BMI 19.80 kg/m  BP Readings from Last 3 Encounters:  09/16/23 108/63  09/12/23 125/77  08/04/23 125/82   Wt Readings from Last 3 Encounters:  09/16/23 119 lb (54 kg)  08/04/23 119 lb 12 oz (54.3 kg)  06/13/23 119 lb 4.8 oz (54.1 kg)      Physical Exam Vitals and nursing note reviewed.  Constitutional:      General: She is not in acute distress.    Appearance: Normal appearance. She is normal weight. She is not ill-appearing or toxic-appearing.  HENT:     Head: Normocephalic and atraumatic.     Mouth/Throat:     Mouth: Mucous membranes are moist.     Pharynx: Oropharynx is clear. No oropharyngeal exudate or posterior oropharyngeal erythema.  Eyes:     General: No scleral icterus.        Right eye: No discharge.        Left eye: No discharge.  Cardiovascular:     Rate and Rhythm: Normal rate.  Pulmonary:     Effort: Pulmonary effort is normal. No respiratory distress.     Breath sounds: Normal breath sounds. No stridor. No wheezing or rhonchi.  Skin:    General: Skin is warm and dry.     Findings: Rash (vesicular eruption primarily to abdomen, extending up to left breast area; similar rash on BUE) present.     Comments: Single bite mark to L upper medial thigh with resolution to prior erythematous rings around it  Neurological:     Mental Status: She is alert.      No results found for any visits on 09/16/23.  Last CBC Lab Results  Component Value  Date   WBC 8.4 08/04/2023   HGB 12.5 08/04/2023   HCT 38.2 08/04/2023   MCV 94 08/04/2023   MCH 30.9 08/04/2023   RDW 12.1 08/04/2023   PLT 399 08/04/2023   Last metabolic panel Lab Results  Component Value Date   GLUCOSE 87 08/04/2023   NA 132 (L) 08/04/2023   K 4.9 08/04/2023   CL 96 08/04/2023   CO2 22 08/04/2023   BUN 19 08/04/2023   CREATININE 0.71 08/04/2023   EGFR 85 08/04/2023   CALCIUM  10.0 08/04/2023   PHOS 4.1 08/04/2023   PROT 6.5 08/04/2023   ALBUMIN 4.3 08/04/2023   LABGLOB 2.2 08/04/2023   BILITOT 0.3 08/04/2023   ALKPHOS 55 08/04/2023   AST 20 08/04/2023   ALT 20 08/04/2023      The ASCVD Risk score (Arnett DK, et al., 2019) failed to calculate for the following reasons:   The 2019 ASCVD risk score is only valid for ages 54 to 37   Risk score cannot be calculated because patient has a medical history suggesting prior/existing ASCVD  Assessment & Plan:  Tick-borne disease -     Doxycycline  Hyclate; Take 1 capsule (100 mg total) by mouth 2 (two) times daily for 3 days. Take one cap PO Q12hr with food.  Dispense: 6 capsule; Refill: 0  Allergic contact dermatitis due to plants, except food -     predniSONE ; Take 6 tablets (60 mg total) by mouth daily with breakfast for 2 days, THEN 5  tablets (50 mg total) daily with breakfast for 2 days, THEN 4 tablets (40 mg total) daily with breakfast for 2 days, THEN 3 tablets (30 mg total) daily with breakfast for 2 days, THEN 2 tablets (20 mg total) daily with breakfast for 2 days, THEN 1 tablet (10 mg total) daily with breakfast for 2 days.  Dispense: 42 tablet; Refill: 0  Assessment and Plan    Cellulitis, resolving after tick bite Cellulitis resolving with doxycycline . Healing well. Potential tick-borne illness considered but not tested. Doxycycline  effective for coverage. - Extend doxycycline  treatment to a full 10 days.  Vesicular contact dermatitis (suspected poison ivy/oak) Vesicular rash likely due to poison ivy/oak exposure. No systemic allergic symptoms. - Prescribe prednisone  for contact dermatitis. - Advise taking prednisone  in the morning to avoid sleep disturbances and inform about potential increased appetite.  Muscle cramps in feet Muscle cramps in feet. Alternative cholesterol medication provided little relief. Prolia  considered as a contributing factor.  Hyperlipidemia Managed with alternative cholesterol medication with minimal improvement. Statin discontinuation did not affect muscle cramps, suggesting it may not be the cause.  Osteoporosis on Prolia  Osteoporosis managed with Prolia . Concerns about side effects, including jaw issues. - Discuss potential alternatives to Prolia  if concerns persist.         No follow-ups on file.   Benton LITTIE Gave, PA

## 2023-09-17 DIAGNOSIS — G8929 Other chronic pain: Secondary | ICD-10-CM | POA: Diagnosis not present

## 2023-09-19 ENCOUNTER — Encounter: Payer: Self-pay | Admitting: Urgent Care

## 2023-09-19 ENCOUNTER — Other Ambulatory Visit: Payer: Self-pay

## 2023-09-19 DIAGNOSIS — M810 Age-related osteoporosis without current pathological fracture: Secondary | ICD-10-CM

## 2023-09-19 DIAGNOSIS — G4762 Sleep related leg cramps: Secondary | ICD-10-CM

## 2023-09-19 DIAGNOSIS — E538 Deficiency of other specified B group vitamins: Secondary | ICD-10-CM

## 2023-09-19 DIAGNOSIS — E7800 Pure hypercholesterolemia, unspecified: Secondary | ICD-10-CM

## 2023-09-19 MED ORDER — BD LUER-LOK SYRINGE 25G X 1" 3 ML MISC: 1 refills | Status: DC

## 2023-09-19 NOTE — Progress Notes (Signed)
 09/19/2023 Name: JIMESHA RISING MRN: [id number] DOB: 1940/05/30  Chief Complaint  Patient presents with   Medication Assistance   LEVORA WERDEN is a 83 y.o. year old female who presented for a telephone visit.   They were referred to the pharmacist by their PCP for assistance in managing hyperlipidemia/cardiovascular risk reduction.   Subjective:  Care Team: Primary Care Provider: Lowella Benton CROME, [place] ; Next Scheduled Visit: 10/13  Medication Access/Adherence  Current Pharmacy:  [id number] - [place], [place] - [address] [facility] [address] [place] [address] Phone: [phone] Fax: [phone]  -Patient reports affordability concerns with their medications: Yes  -Patient reports access/transportation concerns to their pharmacy: No  -Patient reports adherence concerns with their medications:  No    Hyperlipidemia/ASCVD Risk Reduction Current lipid lowering medications: Repatha  140mg  every 14 days Medications tried in the past: pravastatin, rosuvastatin , Welchol, Lovaza- statins cause significant muscle cramping -Recently prescribed Repatha , but medication was $420 on insurance; patient did pick up medication but is now due for a refill and cannot afford that price every month -History of MI  Objective:  Lab Results  Component Value Date   CREATININE 0.71 08/04/2023   BUN 19 08/04/2023   NA 132 (L) 08/04/2023   K 4.9 08/04/2023   CL 96 08/04/2023   CO2 22 08/04/2023   Lab Results  Component Value Date   CHOL 156 10/16/2022   HDL 71 10/16/2022   LDLCALC 72 10/16/2022   LDLDIRECT 127.0 11/11/2014   TRIG 65 10/16/2022   CHOLHDL 2.2 10/16/2022   Medications Reviewed Today     Reviewed by Deanna Channing LABOR, RPH (Pharmacist) on 09/19/23 at 1438  Med List Status: <None>   Medication Order Taking? Sig Documenting Provider Last Dose Status Informant  AMBULATORY NON FORMULARY MEDICATION [id number]   Calcium , Vitamin D , Magnesium and Zinc. [provider]  Active   cyanocobalamin  (VITAMIN B12) 1000 MCG/ML injection [id number]  ADMINISTER 1 ML IN THE MUSCLE MONTHLY AS DIRECTED Bevin, Erika S, DO  Active   denosumab  (PROLIA ) injection 60 mg [id number]   Wachs, Erika S, DO  Active   denosumab  (PROLIA ) injection 60 mg [id number]   Wachs, Erika S, DO  Active   dextromethorphan-guaiFENesin [facility] DM) 30-600 MG per 12 hr tablet [id number]  Take 1 tablet by mouth 2 (two) times daily as needed for cough. [provider]  Active   docusate sodium (COLACE) 100 MG capsule [id number]  Take 100 mg by mouth daily as needed for mild constipation. [provider]  Active Self  doxycycline  (VIBRAMYCIN ) 100 MG capsule [id number]  Take 1 capsule (100 mg total) by mouth 2 (two) times daily for 3 days. Take one cap PO Q12hr with food. Crain, Whitney L, [place]  Active   Evolocumab  (REPATHA  SURECLICK) 140 MG/ML SOAJ [id number] Yes Inject 140 mg into the skin every 14 (fourteen) days. Crain, Whitney L, PA  Active   FLUoxetine  (PROZAC ) 10 MG capsule [id number]  Take 1 capsule (10 mg total) by mouth daily. Booker Darice SAUNDERS, FNP  Active   levothyroxine  (SYNTHROID ) 100 MCG tablet [id number]  Take 1 tablet (100 mcg total) by mouth daily. Bevin Bernice RAMAN, DO  Active   NONFORMULARY OR COMPOUNDED ITEM [id number]  1 tablet daily. [provider]  Active Self  NONFORMULARY OR COMPOUNDED ITEM [id number]  1 tablet daily. [provider]  Active Self  polyethylene glycol powder (GLYCOLAX/MIRALAX) 17  GM/SCOOP powder [id number]  Take 1 Container by mouth once. [provider]  Active   predniSONE  (DELTASONE ) 10 MG tablet [id number]  Take 6 tablets (60 mg total) by mouth daily with breakfast for 2 days, THEN 5 tablets (50 mg total) daily with breakfast for 2 days, THEN 4 tablets (40 mg total) daily with breakfast for 2 days, THEN 3 tablets (30 mg total) daily with breakfast for 2 days, THEN 2 tablets  (20 mg total) daily with breakfast for 2 days, THEN 1 tablet (10 mg total) daily with breakfast for 2 days. Lowella Folks L, [place]  Active    Patient not taking:   Discontinued 09/19/23 1437 (Change in therapy)   SYRINGE-NEEDLE, DISP, 3 ML (B-D 3CC LUER-LOK SYR 25GX1) 25G X 1 3 ML MISC [id number]  USE FOR B12 SHOTS ONCE A MONTH Booker Darice SAUNDERS, FNP  Active            Assessment/Plan:   Hyperlipidemia/ASCVD Risk Reduction: -Test claim reflects $45 copay for Repatha  140mg  q14d, so likely that patient met deductible when Repatha  was filled and picked up 8/9.  Copay for medication should be $45/month the remainder of the year, but this may go up again in January -Patient endorses affordability of $45 copay -Provided her with my direct phone number, so she can contact me if refill is >$45; or if the medication will not be affordable in the future (beginning of next year)  Noelene Gang A Dhwani Venkatesh, PharmD, DPLA

## 2023-10-10 NOTE — Telephone Encounter (Signed)
 Received paperwork from [facility] visit summary. They are requesting order for DEXA scan and pneumovax. Order placed for DEXA. Pt can get pneumonia vaccine at pharmacy.

## 2023-10-14 ENCOUNTER — Encounter: Payer: Self-pay | Admitting: Urgent Care

## 2023-10-14 DIAGNOSIS — R0683 Snoring: Secondary | ICD-10-CM

## 2023-10-21 ENCOUNTER — Encounter: Admitting: Urgent Care

## 2023-10-25 DIAGNOSIS — E079 Disorder of thyroid, unspecified: Secondary | ICD-10-CM | POA: Diagnosis not present

## 2023-10-25 DIAGNOSIS — R319 Hematuria, unspecified: Secondary | ICD-10-CM | POA: Diagnosis not present

## 2023-10-25 DIAGNOSIS — Z79890 Hormone replacement therapy (postmenopausal): Secondary | ICD-10-CM | POA: Diagnosis not present

## 2023-10-25 DIAGNOSIS — E7800 Pure hypercholesterolemia, unspecified: Secondary | ICD-10-CM | POA: Diagnosis not present

## 2023-10-25 DIAGNOSIS — N3001 Acute cystitis with hematuria: Secondary | ICD-10-CM | POA: Diagnosis not present

## 2023-10-25 DIAGNOSIS — Z79899 Other long term (current) drug therapy: Secondary | ICD-10-CM | POA: Diagnosis not present

## 2023-10-25 DIAGNOSIS — Z7982 Long term (current) use of aspirin: Secondary | ICD-10-CM | POA: Diagnosis not present

## 2023-10-25 DIAGNOSIS — I252 Old myocardial infarction: Secondary | ICD-10-CM | POA: Diagnosis not present

## 2023-10-25 DIAGNOSIS — F32A Depression, unspecified: Secondary | ICD-10-CM | POA: Diagnosis not present

## 2023-10-25 DIAGNOSIS — I10 Essential (primary) hypertension: Secondary | ICD-10-CM | POA: Diagnosis not present

## 2023-10-25 DIAGNOSIS — R3915 Urgency of urination: Secondary | ICD-10-CM | POA: Diagnosis not present

## 2023-10-25 DIAGNOSIS — N390 Urinary tract infection, site not specified: Secondary | ICD-10-CM | POA: Diagnosis not present

## 2023-10-27 ENCOUNTER — Encounter: Payer: Self-pay | Admitting: Urgent Care

## 2023-10-27 ENCOUNTER — Ambulatory Visit: Admitting: Urgent Care

## 2023-10-27 VITALS — BP 105/53 | HR 73 | Ht 65.0 in | Wt 124.0 lb

## 2023-10-27 DIAGNOSIS — M797 Fibromyalgia: Secondary | ICD-10-CM

## 2023-10-27 DIAGNOSIS — I252 Old myocardial infarction: Secondary | ICD-10-CM

## 2023-10-27 DIAGNOSIS — Z0000 Encounter for general adult medical examination without abnormal findings: Secondary | ICD-10-CM | POA: Diagnosis not present

## 2023-10-27 DIAGNOSIS — R252 Cramp and spasm: Secondary | ICD-10-CM

## 2023-10-27 DIAGNOSIS — F32A Depression, unspecified: Secondary | ICD-10-CM

## 2023-10-27 DIAGNOSIS — E782 Mixed hyperlipidemia: Secondary | ICD-10-CM

## 2023-10-27 DIAGNOSIS — Z23 Encounter for immunization: Secondary | ICD-10-CM

## 2023-10-27 DIAGNOSIS — M810 Age-related osteoporosis without current pathological fracture: Secondary | ICD-10-CM | POA: Diagnosis not present

## 2023-10-27 DIAGNOSIS — E7800 Pure hypercholesterolemia, unspecified: Secondary | ICD-10-CM

## 2023-10-27 DIAGNOSIS — E039 Hypothyroidism, unspecified: Secondary | ICD-10-CM | POA: Diagnosis not present

## 2023-10-27 DIAGNOSIS — E538 Deficiency of other specified B group vitamins: Secondary | ICD-10-CM | POA: Diagnosis not present

## 2023-10-27 MED ORDER — REPATHA SURECLICK 140 MG/ML ~~LOC~~ SOAJ: 140.0000 mg | SUBCUTANEOUS | 3 refills | Status: AC

## 2023-10-27 MED ORDER — FLUOXETINE HCL 10 MG PO CAPS
10.0000 mg | ORAL_CAPSULE | Freq: Every day | ORAL | 3 refills | Status: AC
Start: 2023-10-27 — End: ?

## 2023-10-27 MED ORDER — LEVOTHYROXINE SODIUM 100 MCG PO TABS: 100.0000 ug | ORAL_TABLET | Freq: Every day | ORAL | 3 refills | Status: AC

## 2023-10-27 NOTE — Progress Notes (Signed)
 Complete physical exam  Patient: Sheena Craig   DOB: Oct 24, 1940   83 y.o. Female  MRN: [id number]  Subjective:    Chief Complaint  Patient presents with   Annual Exam    Trouble getting Repatha     Sheena Craig is a 83 y.o. female who presents today for a complete physical exam. She reports consuming a general diet. Pt does weight lifting and stretching at home. She generally feels well. She reports sleeping well. She does not have additional problems to discuss today.   Discussed the use of AI scribe software for clinical note transcription with the patient, who gave verbal consent to proceed.  History of Present Illness   Sheena Craig is an 83 year old female who presents for follow-up on medication management and recent urinary tract infection.  She has been experiencing muscle cramps, which have improved with the use of magnesium cream and electrolyte droplets. The cramps were previously associated with rosuvastatin  use, but have improved since switching to Repatha . She continues to take rosuvastatin  and is awaiting a refill of Repatha , which has been delayed due to pharmacy issues.  She recently experienced a urinary tract infection for which she visited urgent care on Saturday. She was prescribed Pyridium and Bactrim and reports improvement in her symptoms, including resolution of hematuria and urinary urgency. No current hematuria or urinary urgency is noted.  She requires a refill of Synthroid , having run out yesterday. She is unaware of any other medications needing refills.  She has a history of osteoporosis and has previously taken Prolia . She missed her last scheduled dose in May. Her last bone density scan was in August 2023.  She maintains a routine of physical activity, including a 20-25 minute stretching routine to manage fibromyalgia. She does not currently incorporate weights into her exercise regimen. She reports adequate sleep  generally, though her schedule was disrupted recently due to travel. Her daughter recorded her snoring, and she is awaiting a sleep study consultation.  She has been in [place]  for the past three weeks, attending a memorial service for her husband.       Most recent fall risk assessment:    04/24/2023    1:18 PM  Fall Risk   Falls in the past year? 0  Number falls in past yr: 0  Injury with Fall? 0  Risk for fall due to : No Fall Risks  Follow up Falls evaluation completed     Most recent depression screenings:    04/24/2023    1:17 PM 10/30/2022    2:07 PM  PHQ 2/9 Scores  PHQ - 2 Score 0 0    Patient Active Problem List   Diagnosis Date Noted   Fibromyalgia 06/13/2023   Elevated blood pressure reading in office without diagnosis of hypertension 06/13/2023   Nocturnal leg cramps 06/13/2023   Hospital discharge follow-up 06/25/2022   GAD (generalized anxiety disorder) 06/25/2022   Weight loss 04/29/2022   Constipation 08/30/2021   Tremor 08/30/2021   Lumbar radiculopathy, right 05/05/2021   Encounter for well adult exam with abnormal findings 08/29/2020   Vitamin D  deficiency 08/29/2020   Abnormal urine odor 08/24/2020   B12 deficiency 02/26/2020   Palpitations 09/16/2019   HLD (hyperlipidemia) 12/04/2018   Left groin pain 11/30/2015   Dysphagia 11/19/2015   Insect bites 11/19/2015   Sacral back pain 11/19/2015   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   Blepharitis of both upper and lower eyelid of left eye 04/21/2015  Blepharitis of both upper and lower eyelid of right eye 04/21/2015   Degenerative drusen of both eyes 04/21/2015   Keratoconjunctivitis sicca of both eyes not specified as Sjogren's 04/21/2015   Pseudophakia of both eyes 04/21/2015   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   Dizziness 11/11/2014   Ganglion cyst of flexor tendon sheath of finger of right hand 06/18/2014   Bronchiectasis without acute exacerbation (HCC) 06/30/2012    Cough 04/09/2012   Heart murmur 03/25/2012   Abnormal CT scan, chest 03/25/2012   Peripheral edema 03/25/2012   Liver lesion 03/25/2012   Pancreatic lesion 03/25/2012   Coronary artery calcification seen on CAT scan 03/25/2012   Degenerative disc disease, cervical 03/25/2012   Degenerative disc disease, lumbar 03/25/2012   Syncope 03/13/2012   Pain 03/04/2012   Respiratory insufficiency following shock, trauma, or surgery 03/04/2012   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   Hypothyroidism 10/21/2011   Preventative health care 10/15/2010   LUMBAR RADICULOPATHY, LEFT 10/06/2009   FIBROMYALGIA 02/09/2007   Osteoporosis 02/09/2007   Sarcoidosis 10/17/2006   Anemia, iron deficiency 10/17/2006   Depression 10/17/2006   Migraine headache 10/17/2006   GERD 10/17/2006   Osteoarthritis 10/17/2006   Past Medical History:  Diagnosis Date   ANEMIA-NOS 10/17/2006   Anxiety    Cataract 3/08   Both eyes   DEPRESSION 10/17/2006   FIBROMYALGIA 02/09/2007   Fibromyalgia    GERD 10/17/2006   Heart murmur    HYPERLIPIDEMIA 10/17/2006   HYPOTHYROIDISM 10/17/2006   LUMBAR RADICULOPATHY, LEFT 10/06/2009   MIGRAINE HEADACHE 10/17/2006   Myocardial infarction (HCC) 6/24   Mild   OSTEOARTHRITIS 10/17/2006   OSTEOPENIA 10/17/2006   Osteopenia 11/23/2014   OSTEOPOROSIS 02/09/2007   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   Sarcoidosis 10/17/2006   Past Surgical History:  Procedure Laterality Date   CATARACT EXTRACTION     bilateral   COLONOSCOPY     EXCISION METACARPAL MASS Right 07/24/2015   Procedure: EXCISION OF RIGHT LONG FINGER  MASS;  Surgeon: Alm Hummer, MD;  Location:  SUR[facility]ERY [facility];  Service: Orthopedics;  Laterality: Right;   EYE SURGERY     Cataracts   FRACTURE SURGERY  2/14   Pelvis   LYMPH NODE BIOPSY  01/14/1966   PELVIC FRACTURE SURGERY  03/04/2012    [facility]   UPPER GASTROINTESTINAL ENDOSCOPY     Social History   Tobacco  Use   Smoking status: Never    Passive exposure: Never   Smokeless tobacco: Never  Vaping Use   Vaping status: Never Used  Substance Use Topics   Alcohol use: No   Drug use: No      Patient Care Team: Lowella Benton CROME, [place] as PCP - General (Physician Assistant) Verlin Lonni BIRCH, MD as PCP - Cardiology (Cardiology) Radionchenko, [place]esto GAILS, MD as Referring Physician (Ophthalmology) Patel, Donika K, DO as Consulting Physician (Neurology)   Outpatient Medications Prior to Visit  Medication Sig   AMBULATORY NON FORMULARY MEDICATION Calcium , Vitamin D , Magnesium and Zinc.   cyanocobalamin  (VITAMIN B12) 1000 MCG/ML injection ADMINISTER 1 ML IN THE MUSCLE MONTHLY AS DIRECTED   docusate sodium (COLACE) 100 MG capsule Take 100 mg by mouth daily as needed for mild constipation.   NONFORMULARY OR COMPOUNDED ITEM 1 tablet daily.   NONFORMULARY OR COMPOUNDED ITEM 1 tablet daily.   phenazopyridine (PYRIDIUM) 200 MG tablet Take by mouth.   polyethylene glycol powder (GLYCOLAX/MIRALAX) 17 GM/SCOOP powder Take 1 Container by mouth once.   sulfamethoxazole-trimethoprim (  BACTRIM DS) 800-160 MG tablet Take 1 tablet by mouth 2 (two) times daily.   SYRINGE-NEEDLE, DISP, 3 ML (B-D 3CC LUER-LOK SYR 25GX1) 25G X 1 3 ML MISC USE FOR B12 SHOTS ONCE A MONTH   [DISCONTINUED] dextromethorphan-guaiFENesin (MUCINEX DM) 30-600 MG per 12 hr tablet Take 1 tablet by mouth 2 (two) times daily as needed for cough.   [DISCONTINUED] Evolocumab  (REPATHA  SURECLICK) 140 MG/ML SOAJ Inject 140 mg into the skin every 14 (fourteen) days.   [DISCONTINUED] FLUoxetine  (PROZAC ) 10 MG capsule Take 1 capsule (10 mg total) by mouth daily.   [DISCONTINUED] levothyroxine  (SYNTHROID ) 100 MCG tablet Take 1 tablet (100 mcg total) by mouth daily.   Facility-Administered Medications Prior to Visit  Medication Dose Route Frequency Provider   denosumab  (PROLIA ) injection 60 mg  60 mg Subcutaneous Once Wachs, Erika S, DO   denosumab   (PROLIA ) injection 60 mg  60 mg Subcutaneous Q6 months Wachs, Erika S, DO    ROS Complete 12 point ROS performed with all pertinent positives listed in HPI      Objective:     BP (!) 105/53   Pulse 73   Ht 5' 5 (1.651 m)   Wt 124 lb (56.2 kg)   SpO2 95%   BMI 20.63 kg/m  BP Readings from Last 3 Encounters:  10/27/23 (!) 105/53  09/16/23 108/63  09/12/23 125/77   Wt Readings from Last 3 Encounters:  10/27/23 124 lb (56.2 kg)  09/16/23 119 lb (54 kg)  08/04/23 119 lb 12 oz (54.3 kg)      Physical Exam Vitals and nursing note reviewed.  Constitutional:      General: She is not in acute distress.    Appearance: Normal appearance. She is not ill-appearing, toxic-appearing or diaphoretic.  HENT:     Head: Normocephalic and atraumatic.     Right Ear: Tympanic membrane, ear canal and external ear normal. There is no impacted cerumen.     Left Ear: Tympanic membrane, ear canal and external ear normal. There is no impacted cerumen.     Nose: Nose normal.     Mouth/Throat:     Mouth: Mucous membranes are moist.     Pharynx: Oropharynx is clear. No oropharyngeal exudate or posterior oropharyngeal erythema.  Eyes:     General: No scleral icterus.       Right eye: No discharge.        Left eye: No discharge.     Extraocular Movements: Extraocular movements intact.     Pupils: Pupils are equal, round, and reactive to light.  Neck:     Thyroid : No thyroid  mass, thyromegaly or thyroid  tenderness.  Cardiovascular:     Rate and Rhythm: Normal rate and regular rhythm.     Pulses: Normal pulses.     Heart sounds: No murmur heard. Pulmonary:     Effort: Pulmonary effort is normal. No respiratory distress.     Breath sounds: Normal breath sounds. No stridor. No wheezing or rhonchi.  Abdominal:     General: Abdomen is flat. Bowel sounds are normal. There is no distension.     Palpations: Abdomen is soft. There is no mass.     Tenderness: There is no abdominal tenderness. There is  no guarding.  Musculoskeletal:     Cervical back: Normal range of motion and neck supple. No rigidity or tenderness.     Right lower leg: No edema.     Left lower leg: No edema.  Lymphadenopathy:  Cervical: No cervical adenopathy.  Skin:    General: Skin is warm and dry.     Coloration: Skin is not jaundiced.     Findings: No bruising, erythema or rash.  Neurological:     General: No focal deficit present.     Mental Status: She is alert and oriented to person, place, and time.     Sensory: No sensory deficit.     Motor: No weakness.  Psychiatric:        Mood and Affect: Mood normal.        Behavior: Behavior normal.      No results found for any visits on 10/27/23. Last CBC Lab Results  Component Value Date   WBC 8.4 08/04/2023   HGB 12.5 08/04/2023   HCT 38.2 08/04/2023   MCV 94 08/04/2023   MCH 30.9 08/04/2023   RDW 12.1 08/04/2023   PLT 399 08/04/2023   Last metabolic panel Lab Results  Component Value Date   GLUCOSE 87 08/04/2023   NA 132 (L) 08/04/2023   K 4.9 08/04/2023   CL 96 08/04/2023   CO2 22 08/04/2023   BUN 19 08/04/2023   CREATININE 0.71 08/04/2023   EGFR 85 08/04/2023   CALCIUM  10.0 08/04/2023   PHOS 4.1 08/04/2023   PROT 6.5 08/04/2023   ALBUMIN 4.3 08/04/2023   LABGLOB 2.2 08/04/2023   BILITOT 0.3 08/04/2023   ALKPHOS 55 08/04/2023   AST 20 08/04/2023   ALT 20 08/04/2023   Last lipids Lab Results  Component Value Date   CHOL 156 10/16/2022   HDL 71 10/16/2022   LDLCALC 72 10/16/2022   LDLDIRECT 127.0 11/11/2014   TRIG 65 10/16/2022   CHOLHDL 2.2 10/16/2022   Last hemoglobin A1c Lab Results  Component Value Date   HGBA1C 5.6 08/24/2021   Last thyroid  functions Lab Results  Component Value Date   TSH 0.935 08/04/2023   Last vitamin D  Lab Results  Component Value Date   VD25OH 48.08 08/24/2021   Last vitamin B12 and Folate Lab Results  Component Value Date   VITAMINB12 440 08/04/2023   FOLATE >20.0 08/04/2023         Assessment & Plan:    Routine Health Maintenance and Physical Exam  Immunization History  Administered Date(s) Administered   Fluad Quad(high Dose 65+) 09/14/2018, 11/06/2020, 10/31/2021   Fluad Trivalent(High Dose 65+) 10/30/2022   H1N1 12/24/2007   INFLUENZA, HIGH DOSE SEASONAL PF 09/24/2017, 10/27/2023   Influenza Split 10/17/2010, 10/21/2011   Influenza Whole 10/09/2006, 10/01/2007, 10/06/2009   Influenza,inj,Quad PF,6+ Mos 09/29/2012, 10/01/2013, 11/11/2014, 09/16/2019   Influenza-Unspecified 10/19/2015   PFIZER(Purple Top)SARS-COV-2 Vaccination 02/04/2019, 02/25/2019, 10/27/2019   Pneumococcal Conjugate-13 09/29/2012   Pneumococcal Polysaccharide-23 09/22/2008   Td 09/22/2008   Td (Adult), 2 Lf Tetanus Toxid, Preservative Free 09/22/2008   Tdap 03/04/2012, 12/04/2018   Zoster Recombinant(Shingrix) 02/01/2018, 07/06/2018   Zoster, Live 07/10/2007, 10/01/2007    Health Maintenance  Topic Date Due   COVID-19 Vaccine (4 - 2025-26 season) 09/15/2023   Medicare Annual Wellness (AWV)  04/23/2024   DTaP/Tdap/Td (5 - Td or Tdap) 12/03/2028   Pneumococcal Vaccine: 50+ Years  Completed   Influenza Vaccine  Completed   DEXA SCAN  Completed   Zoster Vaccines- Shingrix  Completed   Meningococcal B Vaccine  Aged Out   Mammogram  Discontinued    Discussed health benefits of physical activity, and encouraged her to engage in regular exercise appropriate for her age and condition.  Problem List Items Addressed This  Visit     Depression   Relevant Medications   FLUoxetine  (PROZAC ) 10 MG capsule   HLD (hyperlipidemia)   Relevant Medications   Evolocumab  (REPATHA  SURECLICK) 140 MG/ML SOAJ   Other Relevant Orders   AMB Referral VBCI Care Management   Lipid panel   Osteoporosis   Relevant Medications   levothyroxine  (SYNTHROID ) 100 MCG tablet   Other Relevant Orders   B12 and Folate Panel   Hypothyroidism   Relevant Medications   levothyroxine  (SYNTHROID ) 100 MCG tablet    Other Relevant Orders   TSH   B12 deficiency   Relevant Orders   Vitamin D  (25 hydroxy)   Fibromyalgia   Relevant Medications   FLUoxetine  (PROZAC ) 10 MG capsule   Other Visit Diagnoses       Routine adult health maintenance    -  Primary   Relevant Orders   CBC with Differential/Platelet   Hemoglobin A1c   TSH   Lipid panel   Comprehensive metabolic panel with GFR     Immunization due       Relevant Orders   Flu vaccine HIGH DOSE PF(Fluzone Trivalent) (Completed)     Muscle cramps at night       Relevant Medications   Evolocumab  (REPATHA  SURECLICK) 140 MG/ML SOAJ     History of MI (myocardial infarction)       Relevant Medications   Evolocumab  (REPATHA  SURECLICK) 140 MG/ML SOAJ      Return in about 6 months (around 04/26/2024).   Assessment and Plan  Routine health maintenance exam completed today. See above POC.    Hyperlipidemia Well-controlled with Repatha . Prefers Repatha  over rosuvastatin  due to muscle cramps. No lapse in overall cholesterol treatment. - Order three-month supply of Repatha . - Switch to Repatha  indefinitely, discontinue rosuvastatin . - Draw cholesterol labs today. - Ensure pharmacy sets automatic refill for Repatha .  Osteoporosis History of femoral neck fracture. Missed Prolia  injection in May 2025.  - Order and schedule bone density scan. - Draw labs including calcium , renal function, and vitamin D . - Schedule Prolia  injection post lab and scan review. - Encourage use of light weights during stretching.  Fibromyalgia Managed with daily stretching routine. - Continue daily stretching. - Consider adding light weights to routine.  Hypothyroidism Managed with levothyroxine . Ran out of medication yesterday. - Refill levothyroxine . - Draw labs to assess thyroid  function.  Muscle cramps Improved with magnesium cream and electrolyte droplets. - Continue magnesium cream and electrolyte droplets as needed.  Suspected sleep apnea Sleep  study ordered. - Await consultation and scheduling of sleep study.          Benton LITTIE Gave, PA

## 2023-10-27 NOTE — Patient Instructions (Addendum)
 Please schedule your bone density test. Go to our imaging department in [address].  Please discontinue your statin and switch back to repatha .  All medications were refilled.   Please return in 6 months, sooner as needed.

## 2023-10-28 ENCOUNTER — Ambulatory Visit: Payer: Self-pay | Admitting: Urgent Care

## 2023-10-28 LAB — LIPID PANEL
Chol/HDL Ratio: 2 ratio (ref 0.0–4.4)
Cholesterol, Total: 139 mg/dL (ref 100–199)
HDL: 71 mg/dL (ref >39)
LDL Chol Calc (NIH): 52 mg/dL (ref 0–99)
Triglycerides: 85 mg/dL (ref 0–149)
VLDL Cholesterol Cal: 16 mg/dL (ref 5–40)

## 2023-10-28 LAB — CBC WITH DIFFERENTIAL/PLATELET
Basophils Absolute: 0 x10E3/uL (ref 0.0–0.2)
Basos: 1 %
EOS (ABSOLUTE): 0.2 x10E3/uL (ref 0.0–0.4)
Eos: 3 %
Hematocrit: 38.4 % (ref 34.0–46.6)
Hemoglobin: 12.1 g/dL (ref 11.1–15.9)
Immature Grans (Abs): 0 x10E3/uL (ref 0.0–0.1)
Immature Granulocytes: 0 %
Lymphocytes Absolute: 1.3 x10E3/uL (ref 0.7–3.1)
Lymphs: 19 %
MCH: 30.4 pg (ref 26.6–33.0)
MCHC: 31.5 g/dL (ref 31.5–35.7)
MCV: 97 fL (ref 79–97)
Monocytes Absolute: 0.8 x10E3/uL (ref 0.1–0.9)
Monocytes: 11 %
Neutrophils Absolute: 4.7 x10E3/uL (ref 1.4–7.0)
Neutrophils: 66 %
Platelets: 433 x10E3/uL (ref 150–450)
RBC: 3.98 x10E6/uL (ref 3.77–5.28)
RDW: 12 % (ref 11.7–15.4)
WBC: 7 x10E3/uL (ref 3.4–10.8)

## 2023-10-28 LAB — COMPREHENSIVE METABOLIC PANEL WITH GFR
ALT: 15 IU/L (ref 0–32)
AST: 22 IU/L (ref 0–40)
Albumin: 4.1 g/dL (ref 3.7–4.7)
Alkaline Phosphatase: 68 IU/L (ref 48–129)
BUN/Creatinine Ratio: 13 (ref 12–28)
BUN: 12 mg/dL (ref 8–27)
Bilirubin Total: 0.3 mg/dL (ref 0.0–1.2)
CO2: 24 mmol/L (ref 20–29)
Calcium: 9.9 mg/dL (ref 8.7–10.3)
Chloride: 102 mmol/L (ref 96–106)
Creatinine, Ser: 0.94 mg/dL (ref 0.57–1.00)
Globulin, Total: 2.1 g/dL (ref 1.5–4.5)
Glucose: 64 mg/dL — ABNORMAL LOW (ref 70–99)
Potassium: 4.6 mmol/L (ref 3.5–5.2)
Sodium: 140 mmol/L (ref 134–144)
Total Protein: 6.2 g/dL (ref 6.0–8.5)
eGFR: 61 mL/min/1.73 (ref >59)

## 2023-10-28 LAB — HEMOGLOBIN A1C
Est. average glucose Bld gHb Est-mCnc: 108 mg/dL
Hgb A1c MFr Bld: 5.4 % (ref 4.8–5.6)

## 2023-10-28 LAB — TSH: TSH: 2.32 u[IU]/mL (ref 0.450–4.500)

## 2023-10-28 LAB — B12 AND FOLATE PANEL
Folate: 19.4 ng/mL (ref >3.0)
Vitamin B-12: 352 pg/mL (ref 232–1245)

## 2023-10-28 LAB — VITAMIN D 25 HYDROXY (VIT D DEFICIENCY, FRACTURES): Vit D, 25-Hydroxy: 49.4 ng/mL (ref 30.0–100.0)

## 2023-10-29 ENCOUNTER — Telehealth: Payer: Self-pay | Admitting: *Deleted

## 2023-10-29 NOTE — Progress Notes (Unsigned)
 Care Guide Pharmacy Note  10/29/2023 Name: ANIKAH HOGGE MRN: [id number] DOB: 19-Jul-1940  Referred By: Lowella Benton CROME, PA Reason for referral: Call Attempt #1 and Complex Care Management (Outreach to schedule referral with pharmacist )   Avelina SHAUNNA Natal is a 83 y.o. year old female who is a primary care patient of Crain, Whitney L, [place].  Avelina SHAUNNA Natal was referred to the pharmacist for assistance related to: HLD  An unsuccessful telephone outreach was attempted today to contact the patient who was referred to the pharmacy team for assistance with medication assistance. Additional attempts will be made to contact the patient.  Thedford Franks, CMA [place]  [facility], [facility] Guide Direct Dial: [phone]  Fax: [phone] Website: [email or web address]

## 2023-10-30 DIAGNOSIS — F4320 Adjustment disorder, unspecified: Secondary | ICD-10-CM | POA: Diagnosis not present

## 2023-10-30 NOTE — Progress Notes (Signed)
 Care Guide Pharmacy Note  10/30/2023 Name: Sheena Craig MRN: [id number] DOB: [birth year]  Referred By: Lowella Benton CROME, PA Reason for referral: Call Attempt #1 and Complex Care Management (Outreach to schedule referral with pharmacist )   Sheena Craig is a 83 y.o. year old female who is a primary care patient of Crain, Whitney L, [place].  Sheena Craig was referred to the pharmacist for assistance related to: HLD  Successful contact was made with the patient to discuss pharmacy services including being ready for the pharmacist to call at least 5 minutes before the scheduled appointment time and to have medication bottles and any blood pressure readings ready for review. The patient agreed to meet with the pharmacist via telephone visit on 11/11/2023.  [facility] Guide  Direct Dial: [phone]  Fax [phone]

## 2023-11-07 DIAGNOSIS — K080 Exfoliation of teeth due to systemic causes: Secondary | ICD-10-CM | POA: Diagnosis not present

## 2023-11-11 ENCOUNTER — Other Ambulatory Visit: Payer: Self-pay

## 2023-11-11 DIAGNOSIS — E7800 Pure hypercholesterolemia, unspecified: Secondary | ICD-10-CM

## 2023-11-11 NOTE — Progress Notes (Signed)
 11/11/2023 Name: Sheena Craig MRN: [id number] DOB: 1940-04-21  Chief Complaint  Patient presents with   Hyperlipidemia   Sheena Craig is a 83 y.o. year old female who presented for a telephone visit.   They were referred to the pharmacist by their PCP for assistance in managing hyperlipidemia/cardiovascular risk reduction.   Subjective:  Care Team: Primary Care Provider: Lowella Benton CROME, [place] ; Next Scheduled Visit: 04/27/2024  Medication Access/Adherence  Current Pharmacy:  [id number] - [place], [place] - [address] AT [facility] [address] [place] [address] Phone: [phone] Fax: [phone]  -Patient reports affordability concerns with their medications: No -Patient reports access/transportation concerns to their pharmacy: No  -Patient reports adherence concerns with their medications:  No    Hyperlipidemia/ASCVD Risk Reduction Current lipid lowering medications: Repatha  140mg  every 14 days Medications tried in the past: pravastatin, rosuvastatin , Welchol, Lovaza- statins cause significant muscle cramping -Recently prescribed Repatha  and started therapy in August, but went without medication for a few weeks due to trouble getting refill at pharmacy.  Patient went back to using rosuvastatin  during this time, which causes significant muscle cramping. -Patient was seen by PCP again in October, and an 84 day supply of Repatha  was sent to [facility].  Patient was able to pick this up and resume therapy approximately one week ago.  She stopped the rosuvastatin  once Reptha was resumed.   -Tolerating Repatha  well with no noted adverse effects  -History of MI  Objective:  Lab Results  Component Value Date   CREATININE 0.94 10/27/2023   BUN 12 10/27/2023   NA 140 10/27/2023   K 4.6 10/27/2023   CL 102 10/27/2023   CO2 24 10/27/2023   Lab Results  Component Value Date   CHOL 139 10/27/2023   HDL 71  10/27/2023   LDLCALC 52 10/27/2023   LDLDIRECT 127.0 11/11/2014   TRIG 85 10/27/2023   CHOLHDL 2.0 10/27/2023   Medications Reviewed Today     Reviewed by Deanna Channing LABOR, RPH (Pharmacist) on 11/11/23 at 1305  Med List Status: <None>   Medication Order Taking? Sig Documenting Provider Last Dose Status Informant  AMBULATORY NON FORMULARY MEDICATION [id number]  Calcium , Vitamin D , Magnesium and Zinc. [provider]  Active   cyanocobalamin  (VITAMIN B12) 1000 MCG/ML injection [id number]  ADMINISTER 1 ML IN THE MUSCLE MONTHLY AS DIRECTED Bevin, Erika S, DO  Active   denosumab  (PROLIA ) injection 60 mg [id number]   Bevin Bernice RAMAN, DO  Active   denosumab  (PROLIA ) injection 60 mg [id number]   Wachs, Erika S, DO  Active   docusate sodium (COLACE) 100 MG capsule [id number]  Take 100 mg by mouth daily as needed for mild constipation. [provider]  Active Self  Evolocumab  (REPATHA  SURECLICK) 140 MG/ML SOAJ [id number] Yes Inject 140 mg into the skin every 14 (fourteen) days. Crain, Whitney L, PA  Active   FLUoxetine  (PROZAC ) 10 MG capsule [id number]  Take 1 capsule (10 mg total) by mouth daily. Crain, Whitney L, PA  Active   levothyroxine  (SYNTHROID ) 100 MCG tablet [id number]  Take 1 tablet (100 mcg total) by mouth daily. Lowella Benton CROME, [place]  Active   NONFORMULARY OR COMPOUNDED ITEM [id number]  1 tablet daily. [provider]  Active Self  NONFORMULARY OR COMPOUNDED ITEM [id number]  1 tablet daily. [provider]  Active Self    Discontinued 11/11/23 1305 (Completed Course)   polyethylene glycol powder (  GLYCOLAX/MIRALAX) 17 GM/SCOOP powder [id number]  Take 1 Container by mouth once. [provider]  Active     Discontinued 11/11/23 1305 (Completed Course)   SYRINGE-NEEDLE, DISP, 3 ML (B-D 3CC LUER-LOK SYR 25GX1) 25G X 1 3 ML MISC [id number]  USE FOR B12 SHOTS ONCE A MONTH Crain, Whitney L, PA  Active            Assessment/Plan:   Hyperlipidemia/ASCVD Risk  Reduction: -Controlled -Continue Repatha  140mg  every 14 days -Patient states [facility] does offer automatic refills, so she may enroll to prevent gaps in Repatha  therapy in the future -Provided my direct phone number so she can contact me if future issues arise with getting any maintenance medications filled  Channing DELENA Mealing, PharmD, DPLA

## 2023-11-13 ENCOUNTER — Encounter: Payer: Self-pay | Admitting: Neurology

## 2023-11-13 ENCOUNTER — Ambulatory Visit: Payer: Self-pay | Admitting: Neurology

## 2023-11-13 VITALS — BP 116/68 | HR 57 | Ht 65.0 in | Wt 127.4 lb

## 2023-11-13 DIAGNOSIS — R0681 Apnea, not elsewhere classified: Secondary | ICD-10-CM | POA: Diagnosis not present

## 2023-11-13 DIAGNOSIS — R0683 Snoring: Secondary | ICD-10-CM | POA: Diagnosis not present

## 2023-11-13 DIAGNOSIS — G4761 Periodic limb movement disorder: Secondary | ICD-10-CM

## 2023-11-13 DIAGNOSIS — G2581 Restless legs syndrome: Secondary | ICD-10-CM | POA: Diagnosis not present

## 2023-11-13 DIAGNOSIS — R351 Nocturia: Secondary | ICD-10-CM

## 2023-11-13 DIAGNOSIS — Z9189 Other specified personal risk factors, not elsewhere classified: Secondary | ICD-10-CM | POA: Diagnosis not present

## 2023-11-13 NOTE — Progress Notes (Signed)
 Subjective:    Patient ID: Sheena Craig is a 83 y.o. female.  HPI    True Mar, MD, PhD [facility] [address] [address] [place], [place] [address]  Dear Benton,   I saw your patient, Sheena Craig, upon your kind request in my sleep clinic today for initial consultation of her sleep disorder, in particular, concern for underlying obstructive sleep apnea.  The patient is unaccompanied today.  As you know, Sheena Craig is an 83 year old female with an underlying medical history of reflux disease, fibromyalgia, anxiety, depression, history of MI, osteoarthritis, osteoporosis, osteopenia, anemia, cataracts, sarcoidosis, vitamin B12 deficiency, anxiety and depression, who reports snoring and excessive daytime somnolence as well as witnessed apneas per daughter.  Her Epworth sleepiness score is 6 out of 24, fatigue severity score is 14 out of 63.  Patient has intermittent restless leg symptoms and has woken up with leg twitching and leg movements.  She also reports occasional cramping in her legs and toes and may lose sleep from these.  Her late husband also noticed her leg movements.  Sadly, she lost her husband of 60+ years about 4 months ago.  She is holding up okay.  She has a good support system.  She denies nocturnal or morning headaches but has nocturia about 2-3 times per average night.  She does take a nap almost every day, anywhere from 30 minutes to up to 2 hours but tries to avoid longer naps and sets an alarm now.  She does not drink caffeine daily.  She does not drink any alcohol.  She is a non-smoker.  She is not aware of any family history of sleep apnea.  She did have a tonsillectomy as a child.  She lives alone.  She has 2 grown daughters.  Bedtime is generally around 10 and rise time around 7.  She has no pets in the household.  She does not watch TV in her bedroom.  Her Past Medical History Is Significant For: Past Medical  History:  Diagnosis Date   ANEMIA-NOS 10/17/2006   Anxiety    Cataract 3/08   Both eyes   DEPRESSION 10/17/2006   FIBROMYALGIA 02/09/2007   Fibromyalgia    GERD 10/17/2006   Heart murmur    HYPERLIPIDEMIA 10/17/2006   HYPOTHYROIDISM 10/17/2006   LUMBAR RADICULOPATHY, LEFT 10/06/2009   MIGRAINE HEADACHE 10/17/2006   Myocardial infarction (HCC) 6/24   Mild   OSTEOARTHRITIS 10/17/2006   OSTEOPENIA 10/17/2006   Osteopenia 11/23/2014   OSTEOPOROSIS 02/09/2007   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   Sarcoidosis 10/17/2006    Her Past Surgical History Is Significant For: Past Surgical History:  Procedure Laterality Date   CATARACT EXTRACTION     bilateral   COLONOSCOPY     EXCISION METACARPAL MASS Right 07/24/2015   Procedure: EXCISION OF RIGHT LONG FINGER  MASS;  Surgeon: Alm Hummer, MD;  Location: [facility];  Service: Orthopedics;  Laterality: Right;   EYE SURGERY     Cataracts   FRACTURE SURGERY  2/14   Pelvis   LYMPH NODE BIOPSY  01/14/1966   PELVIC FRACTURE SURGERY  03/04/2012    [facility]surgery at [facility]   UPPER GASTROINTESTINAL ENDOSCOPY      Her Family History Is Significant For: Family History  Problem Relation Age of Onset   Hypertension Mother    Anxiety disorder Mother    Heart disease Mother    Rheum arthritis Mother    Arthritis Mother  Miscarriages / Stillbirths Mother    Alcohol abuse Father    Anxiety disorder Father    Heart disease Father    Rheum arthritis Father    Aneurysm Father    Skin cancer Father    Hypertension Father    Uterine cancer Sister    Heart disease Brother    Heart attack Brother    Alcohol abuse Brother    Breast cancer Maternal Aunt    Celiac disease Other        nieces and nephews   Colon cancer Neg Hx    Sleep apnea Neg Hx    Seizures Neg Hx    Stroke Neg Hx    Migraines Neg Hx     Her Social History Is Significant For: Social History   Socioeconomic History   Marital status:  Married    Spouse name: Not on file   Number of children: 2   Years of education: Not on file   Highest education level: Master's degree (e.g., MA, MS, MEng, MEd, MSW, MBA)  Occupational History   Occupation: psychology-counselor  Tobacco Use   Smoking status: Never    Passive exposure: Never   Smokeless tobacco: Never  Vaping Use   Vaping status: Never Used  Substance and Sexual Activity   Alcohol use: No   Drug use: No   Sexual activity: Yes    Partners: Male    Birth control/protection: Post-menopausal  Other Topics Concern   Not on file  Social History Narrative   Right handed.One story home. Lives with husband. Has two daughters. She likes to walk in the woods and watch birds. She sometimes does a little art.       No caffeine - decaf only     Social Drivers of Corporate Investment Banker Strain: Low Risk  (10/23/2023)   Overall Financial Resource Strain (CARDIA)    Difficulty of Paying Living Expenses: Not hard at all  Food Insecurity: No Food Insecurity (10/23/2023)   Hunger Vital Sign    Worried About Running Out of Food in the Last Year: Never true    Ran Out of Food in the Last Year: Never true  Transportation Needs: No Transportation Needs (10/23/2023)   PRAPARE - Administrator, Civil Service (Medical): No    Lack of Transportation (Non-Medical): No  Physical Activity: Insufficiently Active (10/23/2023)   Exercise Vital Sign    Days of Exercise per Week: 3 days    Minutes of Exercise per Session: 40 min  Stress: No Stress Concern Present (10/23/2023)   [employer] of Occupational Health - Occupational Stress Questionnaire    Feeling of Stress: Not at all  Social Connections: Moderately Integrated (10/23/2023)   Social Connection and Isolation Panel    Frequency of Communication with Friends and Family: More than three times a week    Frequency of Social Gatherings with Friends and Family: Twice a week    Attends Religious Services: More than 4  times per year    Active Member of [employer] or Organizations: Yes    Attends Banker Meetings: More than 4 times per year    Marital Status: Widowed    Her Allergies Are:  Allergies  Allergen Reactions   Adhesive [Tape]     Causes skin breakdown   Aspartame And Phenylalanine Other (See Comments)    GI upset,&pain   Rosuvastatin      Significant muscle cramping   Cefdinir Nausea And Vomiting   Dimetapp  Children's Cold-Cough     Other Reaction(s): Not available   Pravastatin Sodium     Other Reaction(s): Not available   Other    Pseudoephedrine Other (See Comments)    hyperactive  Other Reaction(s): Not available  :   Her Current Medications Are:  Outpatient Encounter Medications as of 11/13/2023  Medication Sig   AMBULATORY NON FORMULARY MEDICATION Calcium , Vitamin D , Magnesium and Zinc.   cyanocobalamin  (VITAMIN B12) 1000 MCG/ML injection ADMINISTER 1 ML IN THE MUSCLE MONTHLY AS DIRECTED   docusate sodium (COLACE) 100 MG capsule Take 100 mg by mouth daily as needed for mild constipation.   Evolocumab  (REPATHA  SURECLICK) 140 MG/ML SOAJ Inject 140 mg into the skin every 14 (fourteen) days.   FLUoxetine  (PROZAC ) 10 MG capsule Take 1 capsule (10 mg total) by mouth daily.   levothyroxine  (SYNTHROID ) 100 MCG tablet Take 1 tablet (100 mcg total) by mouth daily.   NONFORMULARY OR COMPOUNDED ITEM 1 tablet daily.   NONFORMULARY OR COMPOUNDED ITEM 1 tablet daily.   polyethylene glycol powder (GLYCOLAX/MIRALAX) 17 GM/SCOOP powder Take 1 Container by mouth once.   SYRINGE-NEEDLE, DISP, 3 ML (B-D 3CC LUER-LOK SYR 25GX1) 25G X 1 3 ML MISC USE FOR B12 SHOTS ONCE A MONTH   Facility-Administered Encounter Medications as of 11/13/2023  Medication   denosumab  (PROLIA ) injection 60 mg   denosumab  (PROLIA ) injection 60 mg  :   Review of Systems:  Out of a complete 14 point review of systems, all are reviewed and negative with the exception of these symptoms as listed  below:   Review of Systems  Objective:  Neurological Exam  Physical Exam Physical Examination:   Vitals:   11/13/23 0928  BP: 116/68  Pulse: (!) 57  SpO2: 97%    General Examination: The patient is a very pleasant 83 y.o. female in no acute distress. She appears well-developed and well-nourished and well groomed.   HEENT: Normocephalic, atraumatic, pupils are equal, round and reactive to light, extraocular tracking is good without limitation to gaze excursion or nystagmus noted. No photophobia.  Hearing is grossly intact.  Face is symmetric with normal facial animation. Speech is clear without dysarthria. There is no hypophonia. There is no lip, neck/head, jaw or voice tremor. Neck is supple with full range of passive and active motion. There are no carotid bruits on auscultation.  Airway/Oropharynx exam reveals: mild mouth dryness, adequate dental hygiene and moderate airway crowding, due to redundant soft palate, small airway entry.  Tonsils absent, neck circumference 13 3/8 inches, mild to moderate overbite noted.  Mallampati class II.  Tongue protrudes centrally and palate elevates symmetrically.   Chest: Clear to auscultation without wheezing, rhonchi or crackles noted.  Heart: S1+S2+0, regular and normal without murmurs, rubs or gallops noted.   Abdomen: Soft, non-tender and non-distended.  Extremities: There is no pitting edema in the distal lower extremities bilaterally.   Skin: Warm and dry without trophic changes noted.   Musculoskeletal: exam reveals no obvious joint deformities.   Neurologically:  Mental status: The patient is awake, alert and oriented in all 4 spheres. Her immediate and remote memory, attention, language skills and fund of knowledge are appropriate. There is no evidence of aphasia, agnosia, apraxia or anomia. Speech is clear with normal prosody and enunciation. Thought process is linear. Mood is normal and affect is normal.  Cranial nerves II - XII  are as described above under HEENT exam.  Motor exam: Normal bulk, strength and tone is noted. There is  no obvious action or resting tremor.  Fine motor skills and coordination: Intact grossly.  Cerebellar testing: No dysmetria or intention tremor. There is no truncal or gait ataxia.  Sensory exam: intact to light touch in the upper and lower extremities.  Gait, station and balance: She stands easily. No veering to one side is noted. No leaning to one side is noted. Posture is age-appropriate and stance is narrow based. Gait shows normal stride length and normal pace. No problems turning are noted.   Assessment and plan:   In summary, JESSICCA STITZER is a very pleasant 83 y.o.-year old female with an underlying medical history of reflux disease, fibromyalgia, anxiety, depression, history of MI, osteoarthritis, osteoporosis, osteopenia, anemia, cataracts, sarcoidosis, vitamin B12 deficiency, anxiety and depression, whose history and physical exam are concerning for sleep disordered breathing, particularly obstructive sleep apnea (OSA). A laboratory attended sleep study is typically considered gold standard for evaluation of sleep disordered breathing.   I had a long chat with the patient about my findings and the diagnosis of sleep apnea, particularly OSA, its prognosis and treatment options. We talked about medical/conservative treatments, surgical interventions and non-pharmacological approaches for symptom control. I explained, in particular, the risks and ramifications of untreated moderate to severe OSA, especially with respect to developing cardiovascular disease down the road, including congestive heart failure (CHF), difficult to treat hypertension, cardiac arrhythmias (particularly A-fib), neurovascular complications including TIA, stroke and dementia. Even type 2 diabetes has, in part, been linked to untreated OSA. Symptoms of untreated OSA may include (but may not be limited to) daytime  sleepiness, nocturia (i.e. frequent nighttime urination), memory problems, mood irritability and suboptimally controlled or worsening mood disorder such as depression and/or anxiety, lack of energy, lack of motivation, physical discomfort, as well as recurrent headaches, especially morning or nocturnal headaches. We talked about the importance of maintaining a healthy lifestyle and striving for healthy weight. In addition, we talked about the importance of striving for and maintaining good sleep hygiene. I recommended a sleep study at this time. I outlined the differences between a laboratory attended sleep study which is considered more comprehensive and accurate over the option of a home sleep test (HST); the latter may lead to underestimation of sleep disordered breathing in some instances and does not help with diagnosing upper airway resistance syndrome and is not accurate enough to diagnose primary central sleep apnea typically. I outlined possible surgical and non-surgical treatment options of OSA, including the use of a positive airway pressure (PAP) device (i.e. CPAP, AutoPAP/APAP or BiPAP in certain circumstances), a custom-made dental device (aka oral appliance, which would require a referral to a specialist dentist or orthodontist typically, and is generally speaking not considered for patients with full dentures or edentulous state), upper airway surgical options, such as traditional UPPP (which is not considered a first-line treatment) or the Inspire device (hypoglossal nerve stimulator, which would involve a referral for consultation with an ENT surgeon, after careful selection, following inclusion criteria - also not first-line treatment). I explained the PAP treatment option to the patient in detail, as this is generally considered first-line treatment.  The patient indicated that she would be willing to try PAP therapy, if the need arises. I explained the importance of being compliant with PAP  treatment, not only for insurance purposes but primarily to improve patient's symptoms symptoms, and for the patient's long term health benefit, including to reduce Her cardiovascular risks longer-term.    We will pick up our discussion about the  next steps and treatment options after testing.  We will keep her posted as to the test results by phone call and/or MyChart messaging where possible.  We will plan to follow-up in sleep clinic accordingly as well.  I answered all her questions today and the patient was in agreement.   I encouraged her to call with any interim questions, concerns, problems or updates or email us  through MyChart.  Generally speaking, sleep test authorizations may take up to 2 weeks, sometimes less, sometimes longer, the patient is encouraged to get in touch with us  if they do not hear back from the sleep lab staff directly within the next 2 weeks.  Thank you very much for allowing me to participate in the care of this nice patient. If I can be of any further assistance to you please do not hesitate to call me at [phone].  Sincerely,   True Mar, MD, PhD

## 2023-11-13 NOTE — Patient Instructions (Signed)

## 2023-11-20 ENCOUNTER — Ambulatory Visit (HOSPITAL_BASED_OUTPATIENT_CLINIC_OR_DEPARTMENT_OTHER)
Admission: RE | Admit: 2023-11-20 | Discharge: 2023-11-20 | Disposition: A | Discharge status: Home / Self Care | Source: Ambulatory Visit | Attending: Urgent Care | Admitting: Urgent Care

## 2023-11-20 ENCOUNTER — Ambulatory Visit: Payer: Self-pay | Admitting: Urgent Care

## 2023-11-20 DIAGNOSIS — M810 Age-related osteoporosis without current pathological fracture: Secondary | ICD-10-CM

## 2023-11-21 ENCOUNTER — Telehealth: Payer: Self-pay

## 2023-11-21 MED ORDER — DENOSUMAB 60 MG/ML ~~LOC~~ SOSY
60.0000 mg | PREFILLED_SYRINGE | Freq: Once | SUBCUTANEOUS | Status: AC
  Administered 2023-11-27: 60 mg via SUBCUTANEOUS

## 2023-11-21 NOTE — Telephone Encounter (Signed)
 What do I need to do to get prolia  approved again? She was on this previously.SABRASABRA

## 2023-11-21 NOTE — Telephone Encounter (Signed)
 Prolia  VOB initiated via [email or web address]  Next Prolia  inj DUE: RESTART

## 2023-11-24 ENCOUNTER — Other Ambulatory Visit (HOSPITAL_COMMUNITY): Payer: Self-pay

## 2023-11-25 ENCOUNTER — Other Ambulatory Visit (HOSPITAL_COMMUNITY): Payer: Self-pay

## 2023-11-25 NOTE — Telephone Encounter (Signed)
 MEDICAL PA SUBMITTED VIA LATENT. KEY: [id number]   PHARMACY COPAY: $762.42 ([facility] PREFERRED)

## 2023-11-25 NOTE — Telephone Encounter (Signed)
 Sheena Craig

## 2023-11-26 ENCOUNTER — Encounter: Payer: Self-pay | Admitting: Urgent Care

## 2023-11-26 DIAGNOSIS — E538 Deficiency of other specified B group vitamins: Secondary | ICD-10-CM

## 2023-11-26 NOTE — Telephone Encounter (Signed)
 PA approved. Patient scheduled.

## 2023-11-26 NOTE — Telephone Encounter (Signed)
 Copied from CRM [id number]. Topic: Clinical - Medication Prior Auth >> Nov 26, 2023 10:51 AM Logan F wrote: Reason for CRM: Jolaine from [facility] called to states that the PA for the denosumab  (PROLIA ) injection 60 mg been APPROVED. No special rules. Effective 11/25/2023-11/24/2024. A copy will be faxed as well

## 2023-11-26 NOTE — Telephone Encounter (Signed)
 Attempted to contact patient to go over cost  results for Prolia -and to assist with scheduling based on her decision regarding results. - requested a  return call - left main office contact number for return call.

## 2023-11-26 NOTE — Telephone Encounter (Signed)
 Pt ready for scheduling for PROLIA  on or after : 11/26/23  Option# 1: Buy/Bill (Office supplied medication)  Out-of-pocket cost due at time of clinic visit: $332  Number of injection/visits approved: 2  Primary: BCBS[employer]-MEDICARE Prolia  co-insurance: 20% Admin fee co-insurance: 0%  Secondary: --- Prolia  co-insurance:  Admin fee co-insurance:   Medical Benefit Details: Date Benefits were checked: 11/25/23 Deductible: NO/ Coinsurance: 20%/ Admin Fee: 0%  Prior Auth: APPROVED PA# [id number] Expiration Date: 11/25/23-11/24/24  # of doses approved: 2 ----------------------------------------------------------------------- Option# 2- Med Obtained from pharmacy: Prolia  is no longer preferred for pharmacy benefit. [facility] is now preferred. PRICING IS FOR [facility]    Pharmacy benefit: Copay $762.42 (Paid to pharmacy) Admin Fee: 0% (Pay at clinic)  Prior Auth: N/A PA# Expiration Date:   # of doses approved:   If patient wants fill through the pharmacy benefit please send prescription to: [facility], and include estimated need by date in rx notes. Pharmacy will ship medication directly to the office.  Patient NOT eligible for Prolia  Copay Card. Copay Card can make patient's cost as little as $25. Link to apply: https://www.amgensupportplus.com/copay  ** This summary of benefits is an estimation of the patient's out-of-pocket cost. Exact cost may very based on individual plan coverage.

## 2023-11-27 ENCOUNTER — Ambulatory Visit

## 2023-11-27 VITALS — BP 130/68 | HR 71 | Resp 18 | Ht 65.0 in

## 2023-11-27 DIAGNOSIS — M810 Age-related osteoporosis without current pathological fracture: Secondary | ICD-10-CM

## 2023-11-27 MED ORDER — DENOSUMAB 60 MG/ML ~~LOC~~ SOSY: 60.0000 mg | PREFILLED_SYRINGE | Freq: Once | SUBCUTANEOUS | Status: AC

## 2023-11-27 MED ORDER — CYANOCOBALAMIN 1000 MCG/ML IJ SOLN: INTRAMUSCULAR | 3 refills | Status: AC

## 2023-11-27 MED ORDER — BD LUER-LOK SYRINGE 25G X 1" 3 ML MISC: 1 refills | Status: AC

## 2023-11-27 NOTE — Telephone Encounter (Signed)
 Pateint spoke with Jon Pear and has been scheduled for Prolia  injection for today 11/27/2023

## 2023-11-27 NOTE — Telephone Encounter (Signed)
 Requesting rx rf of B12 and supplies for home injections Last written 01/01/2023 Last OV 10/27/2023 Upcoming appt 04/28/2023

## 2023-11-27 NOTE — Progress Notes (Signed)
   Subjective:    Patient ID: Sheena Craig, female    DOB: 24-Jun-1940, 83 y.o.   MRN: [id number]  HPI  Patient is here for Prolia  injection. Patient reports taking calcium  and vitamin D  daily.Patients calcium  levels and kidney function was within normal limits.  Review of Systems     Objective:   Physical Exam        Assessment & Plan:   Patient given injection in her LUA. Tolerated well without complications. Patient advised to schedule next injection in 6 months.

## 2023-11-28 MED ORDER — DENOSUMAB 60 MG/ML ~~LOC~~ SOSY: 60.0000 mg | PREFILLED_SYRINGE | SUBCUTANEOUS | Status: AC

## 2023-11-28 NOTE — Addendum Note (Signed)
 Addended by: Mycala Warshawsky L on: 11/28/2023 11:36 AM   Modules accepted: Orders

## 2023-12-08 ENCOUNTER — Telehealth: Payer: Self-pay | Admitting: Neurology

## 2023-12-08 NOTE — Telephone Encounter (Signed)
 I spoke with the patient.  She is scheduled at [facility] for 01/22/2024 at 8 pm.  Mailed packet and sent mychart.  [place] BCBS medicare no auth req

## 2024-01-13 ENCOUNTER — Encounter: Payer: Self-pay | Admitting: Cardiovascular Disease

## 2024-01-19 ENCOUNTER — Encounter: Payer: Self-pay | Admitting: Cardiology

## 2024-01-19 ENCOUNTER — Ambulatory Visit: Attending: Cardiology | Admitting: Cardiology

## 2024-01-19 ENCOUNTER — Telehealth: Payer: Self-pay | Admitting: Neurology

## 2024-01-19 ENCOUNTER — Ambulatory Visit

## 2024-01-19 ENCOUNTER — Encounter: Payer: Self-pay | Admitting: Neurology

## 2024-01-19 VITALS — BP 108/66 | HR 66 | Ht 65.0 in | Wt 126.0 lb

## 2024-01-19 DIAGNOSIS — I2510 Atherosclerotic heart disease of native coronary artery without angina pectoris: Secondary | ICD-10-CM | POA: Diagnosis not present

## 2024-01-19 DIAGNOSIS — I499 Cardiac arrhythmia, unspecified: Secondary | ICD-10-CM | POA: Diagnosis not present

## 2024-01-19 DIAGNOSIS — I491 Atrial premature depolarization: Secondary | ICD-10-CM

## 2024-01-19 MED ORDER — ASPIRIN 81 MG PO TBEC: 81.0000 mg | DELAYED_RELEASE_TABLET | Freq: Every day | ORAL | Status: AC

## 2024-01-19 NOTE — Patient Instructions (Signed)
 Medication Instructions:  Your physician recommends that you continue on your current medications as directed. Please refer to the Current Medication list given to you today.  *If you need a refill on your cardiac medications before your next appointment, please call your pharmacy*  Lab Work: None  If you have labs (blood work) drawn today and your tests are completely normal, you will receive your results only by: MyChart Message (if you have MyChart) OR A paper copy in the mail If you have any lab test that is abnormal or we need to change your treatment, we will call you to review the results.  Testing/Procedures: Zio Heart Monitor Your physician has recommended that you wear a holter monitor. Holter monitors are medical devices that record the hearts electrical activity. Doctors most often use these monitors to diagnose arrhythmias. Arrhythmias are problems with the speed or rhythm of the heartbeat. The monitor is a small, portable device. You can wear one while you do your normal daily activities. This is usually used to diagnose what is causing palpitations/syncope (passing out).    Follow-Up: At [facility], you and your health needs are our priority.  As part of our continuing mission to provide you with exceptional heart care, our providers are all part of one team.  This team includes your primary Cardiologist (physician) and Advanced Practice Providers or APPs (Physician Assistants and Nurse Practitioners) who all work together to provide you with the care you need, when you need it.  Your next appointment:   6 week(s)  Provider:   Lonni Cash, MD  Or Thom Sluder, PA-C   We recommend signing up for the patient portal called MyChart.  Sign up information is provided on this After Visit Summary.  MyChart is used to connect with patients for Virtual Visits (Telemedicine).  Patients are able to view lab/test results, encounter notes, upcoming appointments, etc.   Non-urgent messages can be sent to your provider as well.   To learn more about what you can do with MyChart, go to [email or web address].   Other Instructions None

## 2024-01-19 NOTE — Progress Notes (Unsigned)
 Enrolled for [employer] to mail a ZIO XT long term holter monitor to the patients address on file.

## 2024-01-19 NOTE — Telephone Encounter (Signed)
 Patient called to asking if she will be able to adjust the temperature in the room to help her sleep and if need to bring blankets.

## 2024-01-19 NOTE — Telephone Encounter (Signed)
Please see mychart message from today.

## 2024-01-19 NOTE — Progress Notes (Signed)
 " Cardiology Office Note:  .   Date:  01/19/2024  ID:  Sheena Craig, DOB March 04, 1940, MRN [id number] PCP: Lowella Benton CROME, PA  [facility] Providers Cardiologist:  Lonni Cash, MD {  History of Present Illness: .   Sheena Craig is a 84 y.o. female with history of mitral regurgitation, CAD with medically managed disease, orthostatic hypotension, anxiety/depression, fibromyalgia, GERD, hyperlipidemia, sarcoidosis.     CAD 12/2018 CAC score and 486 06/2022 admitted at [facility] at [facility].  NSTEMI with cardiac catheterization with occlusion of the distal right posterolateral branch, no intervention performed, no targets for PCI and no obstructive disease elsewhere.  Did not tolerate low-dose beta-blocker secondary to dizziness/fatigue.  EF has been normal.  Other 04/2022 1 run of VT lasting 6 beats.  10 episodes of SVT longest lasting 7 beats.  Occasional PACs 2.4%.  Mitral regurgitation 04/2023 echocardiogram with preserved biventricular function.  Mild MR.  Syncope Felt to be vasovagal  Social history  No history of tobacco, alcohol, drug use. Slightly active and goes to the senior center exercise classes for 45 minutes. Reports cardiac arrest in her mother at the age of 89, father had MI.  Brother had died at the age of 75 from blood clot after valve replacement.      Patient with history of medically managed distal right posterolateral branch disease with no obstructive disease elsewhere.  No targets for PCI.  Also with mild to moderate MR that is being followed on serial echocardiograms.  Has history of vasovagal syncope without any significant findings on cardiac monitor.  She was last seen in office 10/2022 without cardiac complaints.  Patient recently had contacted our office due to irregular heart rates and had concerns of atrial fibrillation.  Today patient reports that on her blood pressure cuff that she has had intermittent readings of it being  irregular although denies any symptoms associated with this.  Has not had any palpitations, fast heart rates, dizziness, episodes of syncope.  Reports chronic fatigue that has not changed.  She has no acute complaints and was just worried about these irregular measurements.  Reports no exertional symptoms and exercising routinely at the senior center for at least 45 minutes at a time without any limitations.  She reports prior pulmonary history of sarcoidosis noted on biopsy.  Says that she had seen a pulmonologist and did not require further workup.  ROS: Denies: Chest pain, shortness of breath, orthopnea, peripheral edema, palpitations, syncope, decreased exercise capacity, fatigue, dizziness.   Studies Reviewed: Sheena Craig    EKG Interpretation Date/Time:  Monday January 19 2024 14:30:40 EST Ventricular Rate:  66 PR Interval:  148 QRS Duration:  80 QT Interval:  416 QTC Calculation: 436 R Axis:   80  Text Interpretation: Normal sinus rhythm Normal ECG When compared with ECG of 08-Jul-2022 14:59, Premature atrial complexes are no longer Present Confirmed by Darryle Currier [phone]) on 01/19/2024 2:38:44 PM    Risk Assessment/Calculations:          Physical Exam:   VS:  BP 108/66   Pulse 66   Ht 5' 5 (1.651 m)   Wt 126 lb (57.2 kg)   SpO2 95%   BMI 20.97 kg/m    Wt Readings from Last 3 Encounters:  01/19/24 126 lb (57.2 kg)  11/13/23 127 lb 6.4 oz (57.8 kg)  10/27/23 124 lb (56.2 kg)    GEN: Well nourished, well developed in no acute distress NECK: No JVD; No carotid bruits  CARDIAC: RRR, 2 out of 6 murmur LSB  RESPIRATORY:  Clear to auscultation without rales, wheezing or rhonchi  ABDOMEN: Soft, non-tender, non-distended EXTREMITIES:  No edema; No deformity   ASSESSMENT AND PLAN: .    Irregular heartbeat PACs -04/2022 heart monitor with 3.4% PACs.  Rare episodes of SVT/VT. This was noted on her blood pressure cuff.  EKG here demonstrates sinus rhythm.  She has no associated symptoms  with this.  Suspect blood pressure monitor may be capturing previously documented ectopy. Obtain 2-week heart monitor. TSH normal. Of note does not tolerate beta-blocker due to fatigue  CAD -06/2022 LHC with occlusion of distal right posterolateral branch, no targets for PCI.  No obstructive disease elsewhere. No anginal complaints or equivalents that would prompt further workup at this time. She has been taking aspirin , will add this to her medication list.  Continue with Repatha .  LDL 52 done 04/2023.  Mitral regurgitation 04/2023 echocardiogram with preserved biventricular function.  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  Aortic valve sclerosis noted.  Follow-up with echocardiogram 3 to 5 years unless symptoms warrant sooner.  Orthostatic hypotension  Stable right now without syncopal episodes     Dispo: Follow-up in 6 weeks to go over heart monitor results.  Signed, Sheena LITTIE Sluder, PA-C  "

## 2024-01-22 ENCOUNTER — Ambulatory Visit: Admitting: Neurology

## 2024-01-22 DIAGNOSIS — G4761 Periodic limb movement disorder: Secondary | ICD-10-CM

## 2024-01-22 DIAGNOSIS — R0683 Snoring: Secondary | ICD-10-CM

## 2024-01-22 DIAGNOSIS — G4733 Obstructive sleep apnea (adult) (pediatric): Secondary | ICD-10-CM

## 2024-01-22 DIAGNOSIS — R9431 Abnormal electrocardiogram [ECG] [EKG]: Secondary | ICD-10-CM

## 2024-01-22 DIAGNOSIS — R0681 Apnea, not elsewhere classified: Secondary | ICD-10-CM

## 2024-01-22 DIAGNOSIS — R351 Nocturia: Secondary | ICD-10-CM

## 2024-01-22 DIAGNOSIS — G4720 Circadian rhythm sleep disorder, unspecified type: Secondary | ICD-10-CM

## 2024-01-22 DIAGNOSIS — G2581 Restless legs syndrome: Secondary | ICD-10-CM

## 2024-01-22 DIAGNOSIS — Z9189 Other specified personal risk factors, not elsewhere classified: Secondary | ICD-10-CM

## 2024-01-23 NOTE — Procedures (Unsigned)
 Physician Interpretation: Please see link under Procedure Tab or under Encounters tab for physician report, technical report, as well as O2 titration and/or PAP titration tables (if applicable).

## 2024-01-26 ENCOUNTER — Ambulatory Visit: Payer: Self-pay | Admitting: Neurology

## 2024-01-26 DIAGNOSIS — G4733 Obstructive sleep apnea (adult) (pediatric): Secondary | ICD-10-CM

## 2024-01-28 NOTE — Telephone Encounter (Signed)
 Spoke to patient gave sleep study results Pt chose [facility] for DME Gave patient [facility] # Pt aware of insurance compliance Made f/u visit with Sarah,NP 03/2024 sent orders to DME and forward sleep study results to PCP this am. Pt expressed understanding and thanked me for calling .

## 2024-02-11 DIAGNOSIS — I2510 Atherosclerotic heart disease of native coronary artery without angina pectoris: Secondary | ICD-10-CM

## 2024-02-11 DIAGNOSIS — I491 Atrial premature depolarization: Secondary | ICD-10-CM

## 2024-02-11 DIAGNOSIS — I499 Cardiac arrhythmia, unspecified: Secondary | ICD-10-CM

## 2024-02-12 ENCOUNTER — Ambulatory Visit: Payer: Self-pay | Admitting: Cardiology

## 2024-02-20 ENCOUNTER — Telehealth: Payer: Self-pay

## 2024-02-20 NOTE — Telephone Encounter (Signed)
 Copied from CRM #[id number]. Topic: Clinical - Prescription Issue >> Feb 20, 2024  9:00 AM Avram MATSU wrote: Reason for CRM: patient stated she had help in the past about getting Evolocumab  (REPATHA  SURECLICK) cost lower. It would be normally be  $500 but patient would like to pay $45 like she did in the past., please advise [phone]    [id number] - [place], [place] - [address] AT [facility] OF [address] [place] [address] Phone: [phone] Fax: [phone]

## 2024-02-20 NOTE — Telephone Encounter (Signed)
 Thank you :)

## 2024-02-20 NOTE — Telephone Encounter (Signed)
 PA showing in patient chart was done on 08/18/2024 shows co-pay of $420

## 2024-02-20 NOTE — Telephone Encounter (Signed)
 I think you helped patient in the past. Are you able to do this again?

## 2024-03-05 ENCOUNTER — Ambulatory Visit: Admitting: Cardiovascular Disease

## 2024-03-11 ENCOUNTER — Ambulatory Visit: Admitting: Cardiovascular Disease

## 2024-03-16 ENCOUNTER — Encounter: Admitting: Neurology

## 2024-04-27 ENCOUNTER — Ambulatory Visit: Admitting: Urgent Care

## 2024-05-25 ENCOUNTER — Ambulatory Visit
